# Patient Record
Sex: Male | Born: 1965 | Hispanic: No
Health system: Southern US, Community
[De-identification: ages and names within clinical notes are randomized; demographics above are authoritative.]

## PROBLEM LIST (undated history)

## (undated) DIAGNOSIS — S12690S Other displaced fracture of seventh cervical vertebra, sequela: Secondary | ICD-10-CM

## (undated) DIAGNOSIS — I509 Heart failure, unspecified: Secondary | ICD-10-CM

## (undated) DIAGNOSIS — J9621 Acute and chronic respiratory failure with hypoxia: Secondary | ICD-10-CM

## (undated) DIAGNOSIS — I2699 Other pulmonary embolism without acute cor pulmonale: Secondary | ICD-10-CM

## (undated) DIAGNOSIS — J181 Lobar pneumonia, unspecified organism: Secondary | ICD-10-CM

---

## 2000-06-13 ENCOUNTER — Other Ambulatory Visit: Admission: RE | Admit: 2000-06-13 | Discharge: 2000-06-13 | Payer: Self-pay

## 2000-08-15 ENCOUNTER — Other Ambulatory Visit: Admission: RE | Admit: 2000-08-15 | Discharge: 2000-08-15 | Payer: Self-pay | Admitting: Podiatry

## 2006-01-08 ENCOUNTER — Ambulatory Visit: Payer: Self-pay | Admitting: Internal Medicine

## 2007-05-23 ENCOUNTER — Inpatient Hospital Stay: Payer: Self-pay | Admitting: Internal Medicine

## 2007-08-22 ENCOUNTER — Inpatient Hospital Stay: Payer: Self-pay | Admitting: Internal Medicine

## 2009-02-03 ENCOUNTER — Inpatient Hospital Stay: Payer: Self-pay | Admitting: Internal Medicine

## 2010-01-16 ENCOUNTER — Emergency Department: Payer: Self-pay | Admitting: Emergency Medicine

## 2010-01-17 ENCOUNTER — Inpatient Hospital Stay: Payer: Self-pay | Admitting: Internal Medicine

## 2018-12-23 ENCOUNTER — Other Ambulatory Visit (HOSPITAL_COMMUNITY): Payer: Medicare Other

## 2018-12-23 ENCOUNTER — Inpatient Hospital Stay
Admission: RE | Admit: 2018-12-23 | Discharge: 2019-03-19 | Disposition: A | Discharge status: Skilled Nursing Facility | Payer: Medicare Other | Source: Other Acute Inpatient Hospital | Attending: Internal Medicine | Admitting: Internal Medicine

## 2018-12-23 DIAGNOSIS — I82409 Acute embolism and thrombosis of unspecified deep veins of unspecified lower extremity: Secondary | ICD-10-CM

## 2018-12-23 DIAGNOSIS — D72829 Elevated white blood cell count, unspecified: Secondary | ICD-10-CM

## 2018-12-23 DIAGNOSIS — J189 Pneumonia, unspecified organism: Secondary | ICD-10-CM

## 2018-12-23 DIAGNOSIS — R509 Fever, unspecified: Secondary | ICD-10-CM

## 2018-12-23 DIAGNOSIS — Z9911 Dependence on respirator [ventilator] status: Secondary | ICD-10-CM

## 2018-12-23 DIAGNOSIS — Z930 Tracheostomy status: Secondary | ICD-10-CM

## 2018-12-23 DIAGNOSIS — J9811 Atelectasis: Secondary | ICD-10-CM

## 2018-12-23 DIAGNOSIS — N179 Acute kidney failure, unspecified: Secondary | ICD-10-CM

## 2018-12-23 DIAGNOSIS — J9621 Acute and chronic respiratory failure with hypoxia: Secondary | ICD-10-CM | POA: Diagnosis present

## 2018-12-23 DIAGNOSIS — J9690 Respiratory failure, unspecified, unspecified whether with hypoxia or hypercapnia: Secondary | ICD-10-CM

## 2018-12-23 DIAGNOSIS — J181 Lobar pneumonia, unspecified organism: Secondary | ICD-10-CM | POA: Diagnosis present

## 2018-12-23 DIAGNOSIS — I509 Heart failure, unspecified: Secondary | ICD-10-CM

## 2018-12-23 DIAGNOSIS — S12690S Other displaced fracture of seventh cervical vertebra, sequela: Secondary | ICD-10-CM

## 2018-12-23 DIAGNOSIS — I2699 Other pulmonary embolism without acute cor pulmonale: Secondary | ICD-10-CM | POA: Diagnosis present

## 2018-12-23 DIAGNOSIS — R0902 Hypoxemia: Secondary | ICD-10-CM

## 2018-12-23 DIAGNOSIS — Z931 Gastrostomy status: Secondary | ICD-10-CM

## 2018-12-23 HISTORY — DX: Acute and chronic respiratory failure with hypoxia: J96.21

## 2018-12-23 HISTORY — DX: Lobar pneumonia, unspecified organism: J18.1

## 2018-12-23 HISTORY — DX: Other displaced fracture of seventh cervical vertebra, sequela: S12.690S

## 2018-12-23 HISTORY — DX: Other pulmonary embolism without acute cor pulmonale: I26.99

## 2018-12-23 HISTORY — DX: Heart failure, unspecified: I50.9

## 2018-12-23 MED ORDER — IOHEXOL 300 MG/ML  SOLN
50.0000 mL | Freq: Once | INTRAMUSCULAR | Status: AC | PRN
  Administered 2018-12-23: 23:00:00 50 mL

## 2018-12-24 ENCOUNTER — Encounter: Payer: Self-pay | Admitting: Internal Medicine

## 2018-12-24 DIAGNOSIS — I2699 Other pulmonary embolism without acute cor pulmonale: Secondary | ICD-10-CM

## 2018-12-24 DIAGNOSIS — J9621 Acute and chronic respiratory failure with hypoxia: Secondary | ICD-10-CM | POA: Diagnosis not present

## 2018-12-24 DIAGNOSIS — J181 Lobar pneumonia, unspecified organism: Secondary | ICD-10-CM | POA: Diagnosis not present

## 2018-12-24 DIAGNOSIS — I509 Heart failure, unspecified: Secondary | ICD-10-CM

## 2018-12-24 DIAGNOSIS — S12690S Other displaced fracture of seventh cervical vertebra, sequela: Secondary | ICD-10-CM

## 2018-12-24 LAB — CBC
HCT: 29.5 % — ABNORMAL LOW (ref 39.0–52.0)
Hemoglobin: 8.7 g/dL — ABNORMAL LOW (ref 13.0–17.0)
MCH: 29.1 pg (ref 26.0–34.0)
MCHC: 29.5 g/dL — ABNORMAL LOW (ref 30.0–36.0)
MCV: 98.7 fL (ref 80.0–100.0)
Platelets: 183 10*3/uL (ref 150–400)
RBC: 2.99 MIL/uL — ABNORMAL LOW (ref 4.22–5.81)
RDW: 14 % (ref 11.5–15.5)
WBC: 5.8 10*3/uL (ref 4.0–10.5)
nRBC: 0 % (ref 0.0–0.2)

## 2018-12-24 LAB — BLOOD GAS, ARTERIAL
Acid-Base Excess: 8 mmol/L — ABNORMAL HIGH (ref 0.0–2.0)
Bicarbonate: 32.6 mmol/L — ABNORMAL HIGH (ref 20.0–28.0)
FIO2: 60
O2 Saturation: 99.2 %
Patient temperature: 37
pCO2 arterial: 51.6 mmHg — ABNORMAL HIGH (ref 32.0–48.0)
pH, Arterial: 7.418 (ref 7.350–7.450)
pO2, Arterial: 132 mmHg — ABNORMAL HIGH (ref 83.0–108.0)

## 2018-12-24 LAB — COMPREHENSIVE METABOLIC PANEL
ALT: 49 U/L — ABNORMAL HIGH (ref 0–44)
AST: 27 U/L (ref 15–41)
Albumin: 1.9 g/dL — ABNORMAL LOW (ref 3.5–5.0)
Alkaline Phosphatase: 71 U/L (ref 38–126)
Anion gap: 10 (ref 5–15)
BUN: 24 mg/dL — ABNORMAL HIGH (ref 6–20)
CO2: 29 mmol/L (ref 22–32)
Calcium: 9.5 mg/dL (ref 8.9–10.3)
Chloride: 102 mmol/L (ref 98–111)
Creatinine, Ser: 0.33 mg/dL — ABNORMAL LOW (ref 0.61–1.24)
GFR calc Af Amer: >60 mL/min (ref >60)
GFR calc non Af Amer: >60 mL/min (ref >60)
Glucose, Bld: 103 mg/dL — ABNORMAL HIGH (ref 70–99)
Potassium: 4.4 mmol/L (ref 3.5–5.1)
Sodium: 141 mmol/L (ref 135–145)
Total Bilirubin: 0.6 mg/dL (ref 0.3–1.2)
Total Protein: 5.9 g/dL — ABNORMAL LOW (ref 6.5–8.1)

## 2018-12-24 LAB — TSH: TSH: 2.088 u[IU]/mL (ref 0.350–4.500)

## 2018-12-24 LAB — APTT: aPTT: 34 seconds (ref 24–36)

## 2018-12-24 LAB — PROTIME-INR
INR: 1.1 (ref 0.8–1.2)
Prothrombin Time: 14.3 seconds (ref 11.4–15.2)

## 2018-12-24 MED ORDER — INSULIN REGULAR HUMAN 100 UNIT/ML IJ SOLN
0.00 | INTRAMUSCULAR | Status: DC
Start: 2018-12-23 — End: 2018-12-24

## 2018-12-24 MED ORDER — ONDANSETRON HCL 4 MG/2ML IJ SOLN
4.00 | INTRAMUSCULAR | Status: DC
Start: ? — End: 2018-12-24

## 2018-12-24 MED ORDER — CLOZAPINE 100 MG PO TABS
600.00 | ORAL_TABLET | ORAL | Status: DC
Start: ? — End: 2018-12-24

## 2018-12-24 MED ORDER — BISACODYL 10 MG RE SUPP
10.00 | RECTAL | Status: DC
Start: 2018-12-24 — End: 2018-12-24

## 2018-12-24 MED ORDER — SODIUM CHLORIDE FLUSH 0.9 % IV SOLN
10.00 | INTRAVENOUS | Status: DC
Start: 2018-12-23 — End: 2018-12-24

## 2018-12-24 MED ORDER — MORPHINE SULFATE 4 MG/ML IJ SOLN
4.00 | INTRAMUSCULAR | Status: DC
Start: ? — End: 2018-12-24

## 2018-12-24 MED ORDER — DEXTROSE 50 % IV SOLN
12.50 | INTRAVENOUS | Status: DC
Start: ? — End: 2018-12-24

## 2018-12-24 MED ORDER — SENNA 8.8 MG/5ML PO SYRP
10.00 | ORAL_SOLUTION | ORAL | Status: DC
Start: 2018-12-23 — End: 2018-12-24

## 2018-12-24 MED ORDER — GENERIC EXTERNAL MEDICATION
1.00 | Status: DC
Start: 2018-12-24 — End: 2018-12-24

## 2018-12-24 MED ORDER — OXYCODONE HCL 5 MG PO TABS
5.00 | ORAL_TABLET | ORAL | Status: DC
Start: ? — End: 2018-12-24

## 2018-12-24 MED ORDER — INSULIN NPH (HUMAN) (ISOPHANE) 100 UNIT/ML ~~LOC~~ SUSP
10.00 | SUBCUTANEOUS | Status: DC
Start: ? — End: 2018-12-24

## 2018-12-24 MED ORDER — CLOMIPRAMINE HCL 50 MG PO CAPS
100.00 | ORAL_CAPSULE | ORAL | Status: DC
Start: 2018-12-24 — End: 2018-12-24

## 2018-12-24 MED ORDER — ESOMEPRAZOLE MAGNESIUM 20 MG PO PACK
20.00 | PACK | ORAL | Status: DC
Start: 2018-12-24 — End: 2018-12-24

## 2018-12-24 MED ORDER — GENERIC EXTERNAL MEDICATION
240.00 | Status: DC
Start: ? — End: 2018-12-24

## 2018-12-24 MED ORDER — BACITRACIN 500 UNIT/GM EX OINT
TOPICAL_OINTMENT | CUTANEOUS | Status: DC
Start: 2018-12-23 — End: 2018-12-24

## 2018-12-24 MED ORDER — ATORVASTATIN CALCIUM 40 MG PO TABS
40.00 | ORAL_TABLET | ORAL | Status: DC
Start: 2018-12-23 — End: 2018-12-24

## 2018-12-24 MED ORDER — VALPROIC ACID 250 MG/5ML PO SOLN
500.00 | ORAL | Status: DC
Start: 2018-12-23 — End: 2018-12-24

## 2018-12-24 MED ORDER — ALBUTEROL SULFATE (2.5 MG/3ML) 0.083% IN NEBU
2.50 | INHALATION_SOLUTION | RESPIRATORY_TRACT | Status: DC
Start: ? — End: 2018-12-24

## 2018-12-24 MED ORDER — QUETIAPINE FUMARATE 25 MG PO TABS
25.00 | ORAL_TABLET | ORAL | Status: DC
Start: ? — End: 2018-12-24

## 2018-12-24 MED ORDER — CHLORHEXIDINE GLUCONATE 0.12 % MT SOLN
5.00 | OROMUCOSAL | Status: DC
Start: 2018-12-23 — End: 2018-12-24

## 2018-12-24 MED ORDER — APIXABAN 5 MG PO TABS
5.00 | ORAL_TABLET | ORAL | Status: DC
Start: 2018-12-23 — End: 2018-12-24

## 2018-12-24 MED ORDER — CARBOXYMETHYLCELLULOSE SOD PF 1 % OP GEL
2.00 | OPHTHALMIC | Status: DC
Start: 2018-12-23 — End: 2018-12-24

## 2018-12-24 MED ORDER — BUPROPION HCL 75 MG PO TABS
75.00 | ORAL_TABLET | ORAL | Status: DC
Start: 2018-12-23 — End: 2018-12-24

## 2018-12-24 MED ORDER — POLYETHYLENE GLYCOL 3350 17 G PO PACK
17.00 | PACK | ORAL | Status: DC
Start: 2018-12-24 — End: 2018-12-24

## 2018-12-24 MED ORDER — DOCUSATE SODIUM 150 MG/15ML PO LIQD
100.00 | ORAL | Status: DC
Start: 2018-12-23 — End: 2018-12-24

## 2018-12-24 MED ORDER — METHOCARBAMOL 500 MG PO TABS
1000.00 | ORAL_TABLET | ORAL | Status: DC
Start: 2018-12-23 — End: 2018-12-24

## 2018-12-24 NOTE — Consult Note (Signed)
[facility]  Date of Service: 12/24/2018  PULMONARY CRITICAL CARE CONSULT   PARAG DORTON  FAO:[id number]  DOB: [birth year]   DOA: 12/23/2018  Referring Physician: Merton Border, MD  HPI: Raymond Avila is a 53 y.o. male seen for follow up of Acute on Chronic Respiratory Failure.  Patient with multiple medical problems including morbid obesity schizophrenia apparently took a fall and fractured C7-T1.  The patient apparently underwent an ACDF on October 15 and has postoperative course was complicated by respiratory failure prolonged intubation who eventually ended up having to have a tracheostomy.  Along with this he had other complications related to pneumonia.  Patient presents to [place] now for further management of bleeding basically morbidly obese gentleman with a tracheostomy in place and was currently on a PEEP of 14  Review of Systems:  ROS performed and is unremarkable other than noted above.  Past Surgical History:  Procedure Laterality Date  . PR ALLOGRAFT FOR SPINE SURGERY ONLY MORSELIZED Midline 11/26/2018  Procedure: ALLOGRAFT FOR SPINE SURGERY ONLY; MORSELIZED; Surgeon: Sheran Fava, MD; Location: MAIN OR [facility]; Service: Neurosurgery  . PR ARTHRODESIS ANT INTERBODY INC DISCECTOMY, CERVICAL BELOW C2 Bilateral 11/26/2018  Procedure: ARTHRODES, ANT INTRBDY, INCL DISC SPC PREP, DISCECT, OSTEOPHYT/DECOMPRESS SPINL CRD &/OR NRV RT, CRV BLO C2; Surgeon: Sheran Fava, MD; Location: MAIN OR [facility]; Service: Neurosurgery  . PR INSJ BIOMCHN DEV INTERVERTEBRAL DSC SPC W/ARTHRD Midline 11/26/2018  Procedure: INSERT INTERBODY BIOMECHANICAL DEVICE(S) WITH INTEGRAL ANTERIOR INSTRUMENT FOR DEVICE ANCHORING, WHEN PERFORMED, TO INTERVERTEBRAL DISC SPACE IN CONJUNCTION WITH INTERBODY ARTHRODESIS, EACH INTERSPACE; Surgeon: Sheran Fava, MD; Location: MAIN OR [place]; Service: Neurosurgery  . PR IONM 1 ON 1 IN OR  W/ATTENDANCE EACH 15 MINUTES N/A 11/26/2018  Procedure: CONTINUOUS INTRAOPERATIVE NEUROPHYSIOLOGY MONITORING IN OR; Surgeon: Sheran Fava, MD; Location: MAIN OR [facility]; Service: Neurosurgery  . PR OPEN POST RX CERV VERT FX,1 LVL Midline 11/26/2018  Procedure: Open Treatment &/Or Reduction Vertebral Fx &/Or Dislocat Posterior Appr, 1 Fx Vertebra/Disl Segmt; Cervical; Surgeon: Sheran Fava, MD; Location: MAIN OR [facility]; Service: Neurosurgery  . PR OSTEOTOMY CERV SP,ANT,REMV DISK Midline 11/26/2018  Procedure: Osteotomy Of Spine, Including Diskectomy, Anterior Approach, Single Vertebral Segment; Cervical; Surgeon: Sheran Fava, MD; Location: MAIN OR [facility]; Service: Neurosurgery  . PR REMOVAL NODES, NECK,CERV MOD RAD N/A 11/26/2018  Procedure: CERVICAL LYMPHADENECTOMY (MODIFIED RADICAL NECK DISSECTION); Surgeon: Weber Cooks, MD; Location: MAIN OR [facility]; Service: ENT    Allergies No known allergies  Past medical history Schizophrenia Chronic pain ACDF Morbid obesity Pulmonary edema Pneumonia  Tobacco Use  . Smoking status: Never Smoker  . Smokeless tobacco: Never Used  Substance Use Topics  . Alcohol use: Not on file   Family history: Noncontributory to the present illness  Medications: Reviewed on Rounds  Physical Exam:  Vitals: Temperature 97.5 pulse 71 respiratory 15 blood pressure 136/91 saturations 98%  Ventilator Settings mode of ventilation assist control FiO2 55% tidal volume 532 PEEP 14  . General: Comfortable at this time . Eyes: Grossly normal lids, irises & conjunctiva . ENT: grossly tongue is normal . Neck: no obvious mass . Cardiovascular: S1-S2 normal no gallop or rub is noted . Respiratory: No rhonchi no rales are noted . Abdomen: Morbidly obese . Skin: no rash seen on limited exam . Musculoskeletal: not rigid . Psychiatric:unable to assess . Neurologic: no seizure no involuntary movements         Labs on  Admission:  Basic  Metabolic Panel: No results for input(s): NA, K, CL, CO2, GLUCOSE, BUN, CREATININE, CALCIUM, MG, PHOS in the last 168 hours.  Recent Labs  Lab 12/24/18 0015  PHART 7.418  PCO2ART 51.6*  PO2ART 132*  HCO3 32.6*  O2SAT 99.2    Liver Function Tests: No results for input(s): AST, ALT, ALKPHOS, BILITOT, PROT, ALBUMIN in the last 168 hours. No results for input(s): LIPASE, AMYLASE in the last 168 hours. No results for input(s): AMMONIA in the last 168 hours.  CBC: No results for input(s): WBC, NEUTROABS, HGB, HCT, MCV, PLT in the last 168 hours.  Cardiac Enzymes: No results for input(s): CKTOTAL, CKMB, CKMBINDEX, TROPONINI in the last 168 hours.  BNP (last 3 results) No results for input(s): BNP in the last 8760 hours.  ProBNP (last 3 results) No results for input(s): PROBNP in the last 8760 hours.   Radiological Exams on Admission: Dg Abdomen Peg Tube Location  Result Date: 12/23/2018 CLINICAL DATA:  Status post PEG tube placement. EXAM: ABDOMEN - 1 VIEW COMPARISON:  None. FINDINGS: Injection of contrast through the gastrostomy tube opacifies the stomach. The bowel gas pattern is nonobstructive and nonspecific. IMPRESSION: Injection of contrast through the PEG tube opacifies the stomach. Electronically Signed   By: Katherine Mantle M.D.   On: 12/23/2018 23:37   Dg Chest Port 1 View  Result Date: 12/23/2018 CLINICAL DATA:  Respiratory failure EXAM: PORTABLE CHEST 1 VIEW COMPARISON:  August 22, 2007 FINDINGS: The right-sided PICC line is well position. There is scattered bronchitic changes at the lung bases bilaterally. Atelectasis is noted. No pneumothorax. No large pleural effusion. The heart size is enlarged. There appears to be a tracheostomy tube in place, however an overlying ACDF plate obscures the upper thorax. IMPRESSION: 1. Lines and tubes as above. 2. Cardiomegaly. 3. Bibasilar airspace disease favored to represent atelectasis with infiltrate not excluded. Electronically  Signed   By: Katherine Mantle M.D.   On: 12/23/2018 23:39    Assessment/Plan Active Problems:   Acute on chronic respiratory failure with hypoxia (HCC)   Lobar pneumonia, unspecified organism (HCC)   Chronic congestive heart failure (HCC)   Closed C7 fracture without spinal cord injury, sequela   Pulmonary embolism and infarction (HCC)   1. Acute on chronic respiratory failure hypoxia at this time patient is on full vent support of asked for respiratory therapy to assess the RSB I mechanics and try to begin the weaning protocol. 2. Chronic congestive heart failure chest x-ray shows significant cardiomegaly we will need to monitor his fluid status quite closely. 3. Lobar pneumonia at the other facility was treated chest x-ray still showing some atelectasis versus infiltrate we will continue to monitor. 4. C7 fracture status post ACDF hopefully because of the lower site of the lesion we should be able to try to wean the patient off the ventilator.  His size will be the main limiting factor. 5. Possible pulmonary embolism patient has been treated at the other facility we will need to follow-up this is noted on review of the chart  I have personally seen and evaluated the patient, evaluated laboratory and imaging results, formulated the assessment and plan and placed orders. The Patient requires high complexity decision making for assessment and support.  Case was discussed on Rounds with the Respiratory Therapy Staff Time Spent  Yevonne Pax, MD [facility] Pulmonary Critical Care Medicine Sleep Medicine

## 2018-12-25 ENCOUNTER — Other Ambulatory Visit (HOSPITAL_COMMUNITY): Payer: Medicare Other

## 2018-12-25 DIAGNOSIS — I509 Heart failure, unspecified: Secondary | ICD-10-CM | POA: Diagnosis not present

## 2018-12-25 DIAGNOSIS — S12690S Other displaced fracture of seventh cervical vertebra, sequela: Secondary | ICD-10-CM | POA: Diagnosis not present

## 2018-12-25 DIAGNOSIS — J181 Lobar pneumonia, unspecified organism: Secondary | ICD-10-CM | POA: Diagnosis not present

## 2018-12-25 DIAGNOSIS — J9621 Acute and chronic respiratory failure with hypoxia: Secondary | ICD-10-CM | POA: Diagnosis not present

## 2018-12-25 LAB — BASIC METABOLIC PANEL
Anion gap: 10 (ref 5–15)
BUN: 28 mg/dL — ABNORMAL HIGH (ref 6–20)
CO2: 31 mmol/L (ref 22–32)
Calcium: 9.4 mg/dL (ref 8.9–10.3)
Chloride: 100 mmol/L (ref 98–111)
Creatinine, Ser: 0.55 mg/dL — ABNORMAL LOW (ref 0.61–1.24)
GFR calc Af Amer: >60 mL/min (ref >60)
GFR calc non Af Amer: >60 mL/min (ref >60)
Glucose, Bld: 127 mg/dL — ABNORMAL HIGH (ref 70–99)
Potassium: 4.4 mmol/L (ref 3.5–5.1)
Sodium: 141 mmol/L (ref 135–145)

## 2018-12-25 LAB — URINALYSIS, ROUTINE W REFLEX MICROSCOPIC
Glucose, UA: NEGATIVE mg/dL
Hgb urine dipstick: NEGATIVE
Ketones, ur: NEGATIVE mg/dL
Leukocytes,Ua: NEGATIVE
Nitrite: NEGATIVE
Protein, ur: 30 mg/dL — AB
Specific Gravity, Urine: 1.03 (ref 1.005–1.030)
pH: 5 (ref 5.0–8.0)

## 2018-12-25 LAB — MAGNESIUM: Magnesium: 2.3 mg/dL (ref 1.7–2.4)

## 2018-12-25 LAB — HEMOGLOBIN A1C
Hgb A1c MFr Bld: 6.6 % — ABNORMAL HIGH (ref 4.8–5.6)
Mean Plasma Glucose: 142.72 mg/dL

## 2018-12-25 LAB — CBC
HCT: 28 % — ABNORMAL LOW (ref 39.0–52.0)
Hemoglobin: 8.4 g/dL — ABNORMAL LOW (ref 13.0–17.0)
MCH: 29.5 pg (ref 26.0–34.0)
MCHC: 30 g/dL (ref 30.0–36.0)
MCV: 98.2 fL (ref 80.0–100.0)
Platelets: 193 10*3/uL (ref 150–400)
RBC: 2.85 MIL/uL — ABNORMAL LOW (ref 4.22–5.81)
RDW: 14.5 % (ref 11.5–15.5)
WBC: 7.6 10*3/uL (ref 4.0–10.5)
nRBC: 0 % (ref 0.0–0.2)

## 2018-12-25 LAB — PHOSPHORUS: Phosphorus: 3.9 mg/dL (ref 2.5–4.6)

## 2018-12-25 NOTE — Progress Notes (Signed)
Pulmonary Critical Care Medicine [place]   PULMONARY CRITICAL CARE SERVICE  PROGRESS NOTE  Date of Service: 12/25/2018  Raymond Avila  KGU:[id number]  DOB: 04/25/1965   DOA: 12/23/2018  Referring Physician: Merton Border, MD  HPI: Raymond Avila is a 53 y.o. male seen for follow up of Acute on Chronic Respiratory Failure.  Patient is on the ventilator and full support right now has been on assist control mode currently oxygen requirements are up to 60%  Medications: Reviewed on Rounds  Physical Exam:  Vitals: Temperature 97.9 pulse 74 respiratory rate 15 blood pressure 128/56 saturations 100%  Ventilator Settings mode ventilation assist control FiO2 60% tidal volume 550 PEEP 12  . General: Comfortable at this time . Eyes: Grossly normal lids, irises & conjunctiva . ENT: grossly tongue is normal . Neck: no obvious mass . Cardiovascular: S1 S2 normal no gallop . Respiratory: No rhonchi no rales are noted at this time . Abdomen: soft . Skin: no rash seen on limited exam . Musculoskeletal: not rigid . Psychiatric:unable to assess . Neurologic: no seizure no involuntary movements         Lab Data:   Basic Metabolic Panel: Recent Labs  Lab 12/24/18 0618 12/25/18 0931  NA 141 141  K 4.4 4.4  CL 102 100  CO2 29 31  GLUCOSE 103* 127*  BUN 24* 28*  CREATININE 0.33* 0.55*  CALCIUM 9.5 9.4  MG  --  2.3  PHOS  --  3.9    ABG: Recent Labs  Lab 12/24/18 0015  PHART 7.418  PCO2ART 51.6*  PO2ART 132*  HCO3 32.6*  O2SAT 99.2    Liver Function Tests: Recent Labs  Lab 12/24/18 0618  AST 27  ALT 49*  ALKPHOS 71  BILITOT 0.6  PROT 5.9*  ALBUMIN 1.9*   No results for input(s): LIPASE, AMYLASE in the last 168 hours. No results for input(s): AMMONIA in the last 168 hours.  CBC: Recent Labs  Lab 12/24/18 0618 12/25/18 0931  WBC 5.8 7.6  HGB 8.7* 8.4*  HCT 29.5* 28.0*  MCV 98.7 98.2  PLT 183 193    Cardiac Enzymes: No results  for input(s): CKTOTAL, CKMB, CKMBINDEX, TROPONINI in the last 168 hours.  BNP (last 3 results) No results for input(s): BNP in the last 8760 hours.  ProBNP (last 3 results) No results for input(s): PROBNP in the last 8760 hours.  Radiological Exams: Dg Abdomen Peg Tube Location  Result Date: 12/23/2018 CLINICAL DATA:  Status post PEG tube placement. EXAM: ABDOMEN - 1 VIEW COMPARISON:  None. FINDINGS: Injection of contrast through the gastrostomy tube opacifies the stomach. The bowel gas pattern is nonobstructive and nonspecific. IMPRESSION: Injection of contrast through the PEG tube opacifies the stomach. Electronically Signed   By: Constance Holster M.D.   On: 12/23/2018 23:37   Dg Chest Port 1 View  Result Date: 12/25/2018 CLINICAL DATA:  Post tracheostomy.  Chronic respiratory failure. EXAM: PORTABLE CHEST 1 VIEW COMPARISON:  12/23/2018 FINDINGS: Patient slightly rotated to the right. Tracheostomy tube in adequate position. Right-sided PICC line unchanged. Lungs are hypoinflated with minimal prominence of the perihilar markings unchanged and may be due to the degree of hypoinflation versus mild vascular congestion. No effusion. Cardiomediastinal silhouette and remainder of the exam is unchanged. IMPRESSION: Hypoinflation with mild prominence of the pulmonary vasculature unchanged and may be due to the degree of hypoinflation versus mild vascular congestion. Tubes and lines as described. Electronically Signed   By: Quillian Quince  Micheline Maze M.D.   On: 12/25/2018 07:12   Dg Chest Port 1 View  Result Date: 12/23/2018 CLINICAL DATA:  Respiratory failure EXAM: PORTABLE CHEST 1 VIEW COMPARISON:  August 22, 2007 FINDINGS: The right-sided PICC line is well position. There is scattered bronchitic changes at the lung bases bilaterally. Atelectasis is noted. No pneumothorax. No large pleural effusion. The heart size is enlarged. There appears to be a tracheostomy tube in place, however an overlying ACDF plate  obscures the upper thorax. IMPRESSION: 1. Lines and tubes as above. 2. Cardiomegaly. 3. Bibasilar airspace disease favored to represent atelectasis with infiltrate not excluded. Electronically Signed   By: Katherine Mantle M.D.   On: 12/23/2018 23:39    Assessment/Plan Active Problems:   Acute on chronic respiratory failure with hypoxia (HCC)   Lobar pneumonia, unspecified organism (HCC)   Chronic congestive heart failure (HCC)   Closed C7 fracture without spinal cord injury, sequela   Pulmonary embolism and infarction (HCC)   1. Acute on chronic respiratory failure with hypoxia we will try to wean FiO2 down further as well as PEEP patient's RSB I should be checked and try begin on pressure support weaning again 2. You were treated we will continue with supportive care 3. Chronic congestive heart failure at baseline 4. C7 fracture we will continue with supportive care status post ACDF 5. Pulmonary embolism treated   I have personally seen and evaluated the patient, evaluated laboratory and imaging results, formulated the assessment and plan and placed orders. The Patient requires high complexity decision making for assessment and support.  Case was discussed on Rounds with the Respiratory Therapy Staff  Yevonne Pax, MD [facility] Pulmonary Critical Care Medicine Sleep Medicine

## 2018-12-26 ENCOUNTER — Other Ambulatory Visit (HOSPITAL_COMMUNITY): Payer: Medicare Other

## 2018-12-26 DIAGNOSIS — I509 Heart failure, unspecified: Secondary | ICD-10-CM | POA: Diagnosis not present

## 2018-12-26 DIAGNOSIS — J9621 Acute and chronic respiratory failure with hypoxia: Secondary | ICD-10-CM | POA: Diagnosis not present

## 2018-12-26 DIAGNOSIS — S12690S Other displaced fracture of seventh cervical vertebra, sequela: Secondary | ICD-10-CM | POA: Diagnosis not present

## 2018-12-26 DIAGNOSIS — J181 Lobar pneumonia, unspecified organism: Secondary | ICD-10-CM | POA: Diagnosis not present

## 2018-12-26 LAB — URINE CULTURE: Culture: NO GROWTH

## 2018-12-26 LAB — BASIC METABOLIC PANEL
Anion gap: 10 (ref 5–15)
BUN: 30 mg/dL — ABNORMAL HIGH (ref 6–20)
CO2: 33 mmol/L — ABNORMAL HIGH (ref 22–32)
Calcium: 9.6 mg/dL (ref 8.9–10.3)
Chloride: 98 mmol/L (ref 98–111)
Creatinine, Ser: 0.48 mg/dL — ABNORMAL LOW (ref 0.61–1.24)
GFR calc Af Amer: >60 mL/min (ref >60)
GFR calc non Af Amer: >60 mL/min (ref >60)
Glucose, Bld: 101 mg/dL — ABNORMAL HIGH (ref 70–99)
Potassium: 4.6 mmol/L (ref 3.5–5.1)
Sodium: 141 mmol/L (ref 135–145)

## 2018-12-26 LAB — PHOSPHORUS: Phosphorus: 2.9 mg/dL (ref 2.5–4.6)

## 2018-12-26 LAB — CBC
HCT: 31.2 % — ABNORMAL LOW (ref 39.0–52.0)
Hemoglobin: 9.1 g/dL — ABNORMAL LOW (ref 13.0–17.0)
MCH: 28.7 pg (ref 26.0–34.0)
MCHC: 29.2 g/dL — ABNORMAL LOW (ref 30.0–36.0)
MCV: 98.4 fL (ref 80.0–100.0)
Platelets: 200 10*3/uL (ref 150–400)
RBC: 3.17 MIL/uL — ABNORMAL LOW (ref 4.22–5.81)
RDW: 14.5 % (ref 11.5–15.5)
WBC: 7.3 10*3/uL (ref 4.0–10.5)
nRBC: 0 % (ref 0.0–0.2)

## 2018-12-26 LAB — MAGNESIUM: Magnesium: 2.3 mg/dL (ref 1.7–2.4)

## 2018-12-26 NOTE — Progress Notes (Addendum)
Pulmonary Critical Care Medicine [facility] [place]   PULMONARY CRITICAL CARE SERVICE  PROGRESS NOTE  Date of Service: 12/26/2018  TIRRELL BUCHBERGER  TGG:[id number]  DOB: 03/26/1965   DOA: 12/23/2018  Referring Physician: [name], MD  HPI: Raymond Avila is a 53 y.o. male seen for follow up of Acute on Chronic Respiratory Failure.  Patient remains on full support on ventilator at this time unable to wean due to patient's PEEP and also has excessive amount of secretions with a temp of 100.7 today.  Medications: Reviewed on Rounds  Physical Exam:  Vitals: Pulse 78 respirations 31 BP 121/60 O2 sat 97% temp 100.7  Ventilator Settings ventilator mode AC VC rate of 12 tidal line 500 PEEP of 12 with an FiO2 of 50%  . General: Comfortable at this time . Eyes: Grossly normal lids, irises & conjunctiva . ENT: grossly tongue is normal . Neck: no obvious mass . Cardiovascular: S1 S2 normal no gallop . Respiratory: No rales or rhonchi noted . Abdomen: soft . Skin: no rash seen on limited exam . Musculoskeletal: not rigid . Psychiatric:unable to assess . Neurologic: no seizure no involuntary movements         Lab Data:   Basic Metabolic Panel: Recent Labs  Lab 12/24/18 0618 12/25/18 0931 12/26/18 0618  NA 141 141 141  K 4.4 4.4 4.6  CL 102 100 98  CO2 29 31 33*  GLUCOSE 103* 127* 101*  BUN 24* 28* 30*  CREATININE 0.33* 0.55* 0.48*  CALCIUM 9.5 9.4 9.6  MG  --  2.3 2.3  PHOS  --  3.9 2.9    ABG: Recent Labs  Lab 12/24/18 0015  PHART 7.418  PCO2ART 51.6*  PO2ART 132*  HCO3 32.6*  O2SAT 99.2    Liver Function Tests: Recent Labs  Lab 12/24/18 0618  AST 27  ALT 49*  ALKPHOS 71  BILITOT 0.6  PROT 5.9*  ALBUMIN 1.9*   No results for input(s): LIPASE, AMYLASE in the last 168 hours. No results for input(s): AMMONIA in the last 168 hours.  CBC: Recent Labs  Lab 12/24/18 0618 12/25/18 0931 12/26/18 0618  WBC 5.8 7.6 7.3  HGB 8.7* 8.4* 9.1*   HCT 29.5* 28.0* 31.2*  MCV 98.7 98.2 98.4  PLT 183 193 200    Cardiac Enzymes: No results for input(s): CKTOTAL, CKMB, CKMBINDEX, TROPONINI in the last 168 hours.  BNP (last 3 results) No results for input(s): BNP in the last 8760 hours.  ProBNP (last 3 results) No results for input(s): PROBNP in the last 8760 hours.  Radiological Exams: Dg Chest Port 1 View  Result Date: 12/26/2018 CLINICAL DATA:  Respiratory failure EXAM: PORTABLE CHEST 1 VIEW COMPARISON:  12/25/2018 FINDINGS: There is a small, layering right pleural effusion and associated atelectasis or consolidation, more clearly appreciated on this examination. There is chronic pleural thickening about the left lung base. Tracheostomy. Cardiomegaly. IMPRESSION: 1. There is a small, layering right pleural effusion and associated atelectasis or consolidation, more clearly appreciated on this examination, likely increased. PA and lateral radiographs or CT may be helpful to further assess. 2.  Tracheostomy. 3.  Cardiomegaly. Electronically Signed   By: Lauralyn Primes M.D.   On: 12/26/2018 09:38   Dg Chest Port 1 View  Result Date: 12/25/2018 CLINICAL DATA:  Post tracheostomy.  Chronic respiratory failure. EXAM: PORTABLE CHEST 1 VIEW COMPARISON:  12/23/2018 FINDINGS: Patient slightly rotated to the right. Tracheostomy tube in adequate position. Right-sided PICC line unchanged. Lungs  are hypoinflated with minimal prominence of the perihilar markings unchanged and may be due to the degree of hypoinflation versus mild vascular congestion. No effusion. Cardiomediastinal silhouette and remainder of the exam is unchanged. IMPRESSION: Hypoinflation with mild prominence of the pulmonary vasculature unchanged and may be due to the degree of hypoinflation versus mild vascular congestion. Tubes and lines as described. Electronically Signed   By: Marin Olp M.D.   On: 12/25/2018 07:12    Assessment/Plan Active Problems:   Acute on chronic  respiratory failure with hypoxia (HCC)   Lobar pneumonia, unspecified organism (HCC)   Chronic congestive heart failure (HCC)   Closed C7 fracture without spinal cord injury, sequela   Pulmonary embolism and infarction ([place])   1. Acute on chronic respiratory failure with hypoxia patient continues to be weaned as tolerated.  5% on PEEP and FiO2.  Continue supportive measures and pulmonary toilet. 2. Chronic congestive heart failure continue present management. 3. Lobar pneumonia at previous facility treated continue to monitor. 4. C7 fracture we will continue with supportive care status post ACDF 5. Pulmonary embolism treated, continue supportive management    I have personally seen and evaluated the patient, evaluated laboratory and imaging results, formulated the assessment and plan and placed orders. The Patient requires high complexity decision making for assessment and support.  Case was discussed on Rounds with the Respiratory Therapy Staff  Allyne Gee, MD [facility] Pulmonary Critical Care Medicine Sleep Medicine

## 2018-12-27 DIAGNOSIS — I509 Heart failure, unspecified: Secondary | ICD-10-CM | POA: Diagnosis not present

## 2018-12-27 DIAGNOSIS — J9621 Acute and chronic respiratory failure with hypoxia: Secondary | ICD-10-CM | POA: Diagnosis not present

## 2018-12-27 DIAGNOSIS — J181 Lobar pneumonia, unspecified organism: Secondary | ICD-10-CM | POA: Diagnosis not present

## 2018-12-27 DIAGNOSIS — S12690S Other displaced fracture of seventh cervical vertebra, sequela: Secondary | ICD-10-CM | POA: Diagnosis not present

## 2018-12-27 LAB — CULTURE, RESPIRATORY W GRAM STAIN: Culture: NORMAL

## 2018-12-27 LAB — VANCOMYCIN, TROUGH: Vancomycin Tr: 7 ug/mL — ABNORMAL LOW (ref 15–20)

## 2018-12-27 NOTE — Progress Notes (Addendum)
Pulmonary Critical Care Medicine [place]   PULMONARY CRITICAL CARE SERVICE  PROGRESS NOTE  Date of Service: 12/27/2018  UNDRAY ALLMAN  GQQ:[id number]  DOB: [birth year]   DOA: 12/23/2018  Referring Physician: Merton Border, MD  HPI: Raymond Avila is a 53 y.o. male seen for follow up of Acute on Chronic Respiratory Failure.  Patient remains on full support on the ventilator at this time satting well does have fever at this time.  Medications: Reviewed on Rounds  Physical Exam:  Vitals: Pulse 81 R 19 BP 134/67 O2 sat 100% temp 101.4  Ventilator Settings ventilator AC VC rate of 12 tidal volume 500 PEEP of 12 with an FiO2 of 50%  . General: Comfortable at this time . Eyes: Grossly normal lids, irises & conjunctiva . ENT: grossly tongue is normal . Neck: no obvious mass . Cardiovascular: S1 S2 normal no gallop . Respiratory: No rales or rhonchi noted . Abdomen: soft . Skin: no rash seen on limited exam . Musculoskeletal: not rigid . Psychiatric:unable to assess . Neurologic: no seizure no involuntary movements         Lab Data:   Basic Metabolic Panel: Recent Labs  Lab 12/24/18 0618 12/25/18 0931 12/26/18 0618  NA 141 141 141  K 4.4 4.4 4.6  CL 102 100 98  CO2 29 31 33*  GLUCOSE 103* 127* 101*  BUN 24* 28* 30*  CREATININE 0.33* 0.55* 0.48*  CALCIUM 9.5 9.4 9.6  MG  --  2.3 2.3  PHOS  --  3.9 2.9    ABG: Recent Labs  Lab 12/24/18 0015  PHART 7.418  PCO2ART 51.6*  PO2ART 132*  HCO3 32.6*  O2SAT 99.2    Liver Function Tests: Recent Labs  Lab 12/24/18 0618  AST 27  ALT 49*  ALKPHOS 71  BILITOT 0.6  PROT 5.9*  ALBUMIN 1.9*   No results for input(s): LIPASE, AMYLASE in the last 168 hours. No results for input(s): AMMONIA in the last 168 hours.  CBC: Recent Labs  Lab 12/24/18 0618 12/25/18 0931 12/26/18 0618  WBC 5.8 7.6 7.3  HGB 8.7* 8.4* 9.1*  HCT 29.5* 28.0* 31.2*  MCV 98.7 98.2 98.4  PLT 183 193 200     Cardiac Enzymes: No results for input(s): CKTOTAL, CKMB, CKMBINDEX, TROPONINI in the last 168 hours.  BNP (last 3 results) No results for input(s): BNP in the last 8760 hours.  ProBNP (last 3 results) No results for input(s): PROBNP in the last 8760 hours.  Radiological Exams: Dg Chest Port 1 View  Result Date: 12/26/2018 CLINICAL DATA:  Respiratory failure EXAM: PORTABLE CHEST 1 VIEW COMPARISON:  12/25/2018 FINDINGS: There is a small, layering right pleural effusion and associated atelectasis or consolidation, more clearly appreciated on this examination. There is chronic pleural thickening about the left lung base. Tracheostomy. Cardiomegaly. IMPRESSION: 1. There is a small, layering right pleural effusion and associated atelectasis or consolidation, more clearly appreciated on this examination, likely increased. PA and lateral radiographs or CT may be helpful to further assess. 2.  Tracheostomy. 3.  Cardiomegaly. Electronically Signed   By: Eddie Candle M.D.   On: 12/26/2018 09:38    Assessment/Plan Active Problems:   Acute on chronic respiratory failure with hypoxia (HCC)   Lobar pneumonia, unspecified organism (HCC)   Chronic congestive heart failure (HCC)   Closed C7 fracture without spinal cord injury, sequela   Pulmonary embolism and infarction ([place])   1. Acute on chronic respiratory failure with hypoxia  patient is unable to wean at this time due to increased PEEP as well as fever today.  We will continue supportive measures and pulmonary toilet at this time.  Will commence weaning when patient can tolerate. 2. Chronic congestive heart failure continue present management. 3. Lobar pneumonia at previous facility treated continue to monitor. 4. C7 fracture we will continue with supportive care status post ACDF 5. Pulmonary embolism treated, continue supportive management   I have personally seen and evaluated the patient, evaluated laboratory and imaging results,  formulated the assessment and plan and placed orders. The Patient requires high complexity decision making for assessment and support.  Case was discussed on Rounds with the Respiratory Therapy Staff  Yevonne Pax, MD [facility] Pulmonary Critical Care Medicine Sleep Medicine

## 2018-12-28 ENCOUNTER — Other Ambulatory Visit (HOSPITAL_COMMUNITY): Payer: Medicare Other

## 2018-12-28 DIAGNOSIS — I509 Heart failure, unspecified: Secondary | ICD-10-CM | POA: Diagnosis not present

## 2018-12-28 DIAGNOSIS — J181 Lobar pneumonia, unspecified organism: Secondary | ICD-10-CM | POA: Diagnosis not present

## 2018-12-28 DIAGNOSIS — S12690S Other displaced fracture of seventh cervical vertebra, sequela: Secondary | ICD-10-CM | POA: Diagnosis not present

## 2018-12-28 DIAGNOSIS — J9621 Acute and chronic respiratory failure with hypoxia: Secondary | ICD-10-CM | POA: Diagnosis not present

## 2018-12-28 LAB — CBC
HCT: 25.2 % — ABNORMAL LOW (ref 39.0–52.0)
Hemoglobin: 7.6 g/dL — ABNORMAL LOW (ref 13.0–17.0)
MCH: 29.1 pg (ref 26.0–34.0)
MCHC: 30.2 g/dL (ref 30.0–36.0)
MCV: 96.6 fL (ref 80.0–100.0)
Platelets: 191 10*3/uL (ref 150–400)
RBC: 2.61 MIL/uL — ABNORMAL LOW (ref 4.22–5.81)
RDW: 14.1 % (ref 11.5–15.5)
WBC: 6.9 10*3/uL (ref 4.0–10.5)
nRBC: 0 % (ref 0.0–0.2)

## 2018-12-28 LAB — BASIC METABOLIC PANEL
Anion gap: 10 (ref 5–15)
BUN: 23 mg/dL — ABNORMAL HIGH (ref 6–20)
CO2: 34 mmol/L — ABNORMAL HIGH (ref 22–32)
Calcium: 9 mg/dL (ref 8.9–10.3)
Chloride: 94 mmol/L — ABNORMAL LOW (ref 98–111)
Creatinine, Ser: 0.46 mg/dL — ABNORMAL LOW (ref 0.61–1.24)
GFR calc Af Amer: >60 mL/min (ref >60)
GFR calc non Af Amer: >60 mL/min (ref >60)
Glucose, Bld: 107 mg/dL — ABNORMAL HIGH (ref 70–99)
Potassium: 4.5 mmol/L (ref 3.5–5.1)
Sodium: 138 mmol/L (ref 135–145)

## 2018-12-28 LAB — PHOSPHORUS: Phosphorus: 3.1 mg/dL (ref 2.5–4.6)

## 2018-12-28 LAB — OCCULT BLOOD X 1 CARD TO LAB, STOOL: Fecal Occult Bld: NEGATIVE

## 2018-12-28 LAB — MAGNESIUM: Magnesium: 2.1 mg/dL (ref 1.7–2.4)

## 2018-12-28 NOTE — Progress Notes (Signed)
Pulmonary Critical Care Medicine [place]   PULMONARY CRITICAL CARE SERVICE  PROGRESS NOTE  Date of Service: 12/28/2018  Raymond Avila  LNL:[id number]  DOB: 1965-07-02   DOA: 12/23/2018  Referring Physician: Merton Border, MD  HPI: Raymond Avila is a 53 y.o. male seen for follow up of Acute on Chronic Respiratory Failure.  Patient currently is on full support is still on a PEEP of 12 spoke with respiratory therapy to wean the PEEP down so that we can advance weaning  Medications: Reviewed on Rounds  Physical Exam:  Vitals: Temperature 100.1 pulse 87 respiratory 26 blood pressure 135/79 saturations 99%  Ventilator Settings mode ventilation assist control FiO2 45% tidal line 523 PEEP 12  . General: Comfortable at this time . Eyes: Grossly normal lids, irises & conjunctiva . ENT: grossly tongue is normal . Neck: no obvious mass . Cardiovascular: S1 S2 normal no gallop . Respiratory: No rhonchi no rales are noted at this time . Abdomen: soft . Skin: no rash seen on limited exam . Musculoskeletal: not rigid . Psychiatric:unable to assess . Neurologic: no seizure no involuntary movements         Lab Data:   Basic Metabolic Panel: Recent Labs  Lab 12/24/18 0618 12/25/18 0931 12/26/18 0618 12/28/18 0500  NA 141 141 141 138  K 4.4 4.4 4.6 4.5  CL 102 100 98 94*  CO2 29 31 33* 34*  GLUCOSE 103* 127* 101* 107*  BUN 24* 28* 30* 23*  CREATININE 0.33* 0.55* 0.48* 0.46*  CALCIUM 9.5 9.4 9.6 9.0  MG  --  2.3 2.3 2.1  PHOS  --  3.9 2.9 3.1    ABG: Recent Labs  Lab 12/24/18 0015  PHART 7.418  PCO2ART 51.6*  PO2ART 132*  HCO3 32.6*  O2SAT 99.2    Liver Function Tests: Recent Labs  Lab 12/24/18 0618  AST 27  ALT 49*  ALKPHOS 71  BILITOT 0.6  PROT 5.9*  ALBUMIN 1.9*   No results for input(s): LIPASE, AMYLASE in the last 168 hours. No results for input(s): AMMONIA in the last 168 hours.  CBC: Recent Labs  Lab 12/24/18 0618  12/25/18 0931 12/26/18 0618 12/28/18 0500  WBC 5.8 7.6 7.3 6.9  HGB 8.7* 8.4* 9.1* 7.6*  HCT 29.5* 28.0* 31.2* 25.2*  MCV 98.7 98.2 98.4 96.6  PLT 183 193 200 191    Cardiac Enzymes: No results for input(s): CKTOTAL, CKMB, CKMBINDEX, TROPONINI in the last 168 hours.  BNP (last 3 results) No results for input(s): BNP in the last 8760 hours.  ProBNP (last 3 results) No results for input(s): PROBNP in the last 8760 hours.  Radiological Exams: Dg Chest Port 1 View  Result Date: 12/28/2018 CLINICAL DATA:  Respiratory failure EXAM: PORTABLE CHEST 1 VIEW COMPARISON:  December 26, 2018 FINDINGS: Tracheostomy device is present. Right PICC line is unchanged. Persistent right pleural effusion and right basilar opacity. Chronic left pleural thickening. No pneumothorax. Stable cardiomediastinal silhouette. IMPRESSION: Similar appearance of right pleural effusion and adjacent basilar atelectasis/consolidation. Electronically Signed   By: Macy Mis M.D.   On: 12/28/2018 08:20    Assessment/Plan Active Problems:   Acute on chronic respiratory failure with hypoxia (HCC)   Lobar pneumonia, unspecified organism (HCC)   Chronic congestive heart failure (HCC)   Closed C7 fracture without spinal cord injury, sequela   Pulmonary embolism and infarction ([place])   1. Acute on chronic respiratory failure hypoxia continue to decrease the PEEP we should be  able to go down to 10 today and then try to advance weaning 2. Lobar pneumonia treated chest x-ray not showing much change in terms of the atelectasis continue aggressive pulmonary toilet 3. Chronic congestive heart failure diurese as tolerated continue with supportive care 4. C7 fracture no changes noted 5. Pulmonary embolism treated we will continue supportive care   I have personally seen and evaluated the patient, evaluated laboratory and imaging results, formulated the assessment and plan and placed orders. The Patient requires high  complexity decision making for assessment and support.  Case was discussed on Rounds with the Respiratory Therapy Staff  Yevonne Pax, MD [facility] Pulmonary Critical Care Medicine Sleep Medicine

## 2018-12-29 ENCOUNTER — Institutional Professional Consult (permissible substitution) (HOSPITAL_COMMUNITY): Payer: Medicare Other

## 2018-12-29 DIAGNOSIS — S12690S Other displaced fracture of seventh cervical vertebra, sequela: Secondary | ICD-10-CM | POA: Diagnosis not present

## 2018-12-29 DIAGNOSIS — J181 Lobar pneumonia, unspecified organism: Secondary | ICD-10-CM | POA: Diagnosis not present

## 2018-12-29 DIAGNOSIS — J9621 Acute and chronic respiratory failure with hypoxia: Secondary | ICD-10-CM | POA: Diagnosis not present

## 2018-12-29 DIAGNOSIS — I509 Heart failure, unspecified: Secondary | ICD-10-CM | POA: Diagnosis not present

## 2018-12-29 LAB — CBC
HCT: 25.4 % — ABNORMAL LOW (ref 39.0–52.0)
Hemoglobin: 8 g/dL — ABNORMAL LOW (ref 13.0–17.0)
MCH: 29.5 pg (ref 26.0–34.0)
MCHC: 31.5 g/dL (ref 30.0–36.0)
MCV: 93.7 fL (ref 80.0–100.0)
Platelets: 206 10*3/uL (ref 150–400)
RBC: 2.71 MIL/uL — ABNORMAL LOW (ref 4.22–5.81)
RDW: 13.9 % (ref 11.5–15.5)
WBC: 8.2 10*3/uL (ref 4.0–10.5)
nRBC: 0 % (ref 0.0–0.2)

## 2018-12-29 LAB — VANCOMYCIN, TROUGH: Vancomycin Tr: 19 ug/mL (ref 15–20)

## 2018-12-29 LAB — OCCULT BLOOD X 1 CARD TO LAB, STOOL: Fecal Occult Bld: NEGATIVE

## 2018-12-29 NOTE — Progress Notes (Addendum)
Pulmonary Critical Care Medicine [place]   PULMONARY CRITICAL CARE SERVICE  PROGRESS NOTE  Date of Service: 12/29/2018  Raymond JUSTINIANO  [id number]  DOB: 1965/05/25   DOA: 12/23/2018  Referring Physician: Merton Border, MD  HPI: Raymond Avila is a 53 y.o. male seen for follow up of Acute on Chronic Respiratory Failure.  Patient continues on full support on the ventilator satting well no distress.  Medications: Reviewed on Rounds  Physical Exam:  Vitals: Pulse 82 respirations 18 BP 121/61 O2 sat 99% 1.3  Ventilator Settings ventilator mode AC VC rate of 12 tidal volume 500 PEEP of 8 with an FiO2 of 50%  . General: Comfortable at this time . Eyes: Grossly normal lids, irises & conjunctiva . ENT: grossly tongue is normal . Neck: no obvious mass . Cardiovascular: S1 S2 normal no gallop . Respiratory: No rales or rhonchi noted . Abdomen: soft . Skin: no rash seen on limited exam . Musculoskeletal: not rigid . Psychiatric:unable to assess . Neurologic: no seizure no involuntary movements         Lab Data:   Basic Metabolic Panel: Recent Labs  Lab 12/24/18 0618 12/25/18 0931 12/26/18 0618 12/28/18 0500  NA 141 141 141 138  K 4.4 4.4 4.6 4.5  CL 102 100 98 94*  CO2 29 31 33* 34*  GLUCOSE 103* 127* 101* 107*  BUN 24* 28* 30* 23*  CREATININE 0.33* 0.55* 0.48* 0.46*  CALCIUM 9.5 9.4 9.6 9.0  MG  --  2.3 2.3 2.1  PHOS  --  3.9 2.9 3.1    ABG: Recent Labs  Lab 12/24/18 0015  PHART 7.418  PCO2ART 51.6*  PO2ART 132*  HCO3 32.6*  O2SAT 99.2    Liver Function Tests: Recent Labs  Lab 12/24/18 0618  AST 27  ALT 49*  ALKPHOS 71  BILITOT 0.6  PROT 5.9*  ALBUMIN 1.9*   No results for input(s): LIPASE, AMYLASE in the last 168 hours. No results for input(s): AMMONIA in the last 168 hours.  CBC: Recent Labs  Lab 12/24/18 0618 12/25/18 0931 12/26/18 0618 12/28/18 0500 12/29/18 0418  WBC 5.8 7.6 7.3 6.9 8.2  HGB 8.7* 8.4* 9.1*  7.6* 8.0*  HCT 29.5* 28.0* 31.2* 25.2* 25.4*  MCV 98.7 98.2 98.4 96.6 93.7  PLT 183 193 200 191 206    Cardiac Enzymes: No results for input(s): CKTOTAL, CKMB, CKMBINDEX, TROPONINI in the last 168 hours.  BNP (last 3 results) No results for input(s): BNP in the last 8760 hours.  ProBNP (last 3 results) No results for input(s): PROBNP in the last 8760 hours.  Radiological Exams: Dg Chest Port 1 View  Result Date: 12/28/2018 CLINICAL DATA:  Respiratory failure EXAM: PORTABLE CHEST 1 VIEW COMPARISON:  December 26, 2018 FINDINGS: Tracheostomy device is present. Right PICC line is unchanged. Persistent right pleural effusion and right basilar opacity. Chronic left pleural thickening. No pneumothorax. Stable cardiomediastinal silhouette. IMPRESSION: Similar appearance of right pleural effusion and adjacent basilar atelectasis/consolidation. Electronically Signed   By: Macy Mis M.D.   On: 12/28/2018 08:20    Assessment/Plan Active Problems:   Acute on chronic respiratory failure with hypoxia (HCC)   Lobar pneumonia, unspecified organism (HCC)   Chronic congestive heart failure (HCC)   Closed C7 fracture without spinal cord injury, sequela   Pulmonary embolism and infarction ([place])   1. Acute on chronic respiratory failure hypoxia patient has fever today and will be getting abdominal CT.  No weaning at this  time we will continue aggressive pulmonary toilet supportive measures 2. Lobar pneumonia treated chest x-ray not showing much change in terms of the atelectasis continue aggressive pulmonary toilet 3. Chronic congestive heart failure diurese as tolerated continue with supportive care 4. C7 fracture no changes noted 5. Pulmonary embolism treated we will continue supportive care   I have personally seen and evaluated the patient, evaluated laboratory and imaging results, formulated the assessment and plan and placed orders. The Patient requires high complexity decision making for  assessment and support.  Case was discussed on Rounds with the Respiratory Therapy Staff  Yevonne Pax, MD [facility] Pulmonary Critical Care Medicine Sleep Medicine

## 2018-12-30 DIAGNOSIS — J181 Lobar pneumonia, unspecified organism: Secondary | ICD-10-CM | POA: Diagnosis not present

## 2018-12-30 DIAGNOSIS — S12690S Other displaced fracture of seventh cervical vertebra, sequela: Secondary | ICD-10-CM | POA: Diagnosis not present

## 2018-12-30 DIAGNOSIS — I509 Heart failure, unspecified: Secondary | ICD-10-CM | POA: Diagnosis not present

## 2018-12-30 DIAGNOSIS — J9621 Acute and chronic respiratory failure with hypoxia: Secondary | ICD-10-CM | POA: Diagnosis not present

## 2018-12-30 LAB — COMPREHENSIVE METABOLIC PANEL
ALT: 58 U/L — ABNORMAL HIGH (ref 0–44)
AST: 38 U/L (ref 15–41)
Albumin: 1.5 g/dL — ABNORMAL LOW (ref 3.5–5.0)
Alkaline Phosphatase: 70 U/L (ref 38–126)
Anion gap: 7 (ref 5–15)
BUN: 28 mg/dL — ABNORMAL HIGH (ref 6–20)
CO2: 34 mmol/L — ABNORMAL HIGH (ref 22–32)
Calcium: 8.9 mg/dL (ref 8.9–10.3)
Chloride: 97 mmol/L — ABNORMAL LOW (ref 98–111)
Creatinine, Ser: 0.46 mg/dL — ABNORMAL LOW (ref 0.61–1.24)
GFR calc Af Amer: >60 mL/min (ref >60)
GFR calc non Af Amer: >60 mL/min (ref >60)
Glucose, Bld: 128 mg/dL — ABNORMAL HIGH (ref 70–99)
Potassium: 4.1 mmol/L (ref 3.5–5.1)
Sodium: 138 mmol/L (ref 135–145)
Total Bilirubin: 0.7 mg/dL (ref 0.3–1.2)
Total Protein: 5.1 g/dL — ABNORMAL LOW (ref 6.5–8.1)

## 2018-12-30 LAB — MAGNESIUM: Magnesium: 2.1 mg/dL (ref 1.7–2.4)

## 2018-12-30 LAB — C DIFFICILE QUICK SCREEN W PCR REFLEX
C Diff antigen: NEGATIVE
C Diff interpretation: NOT DETECTED
C Diff toxin: NEGATIVE

## 2018-12-30 LAB — CBC
HCT: 24.7 % — ABNORMAL LOW (ref 39.0–52.0)
Hemoglobin: 7.6 g/dL — ABNORMAL LOW (ref 13.0–17.0)
MCH: 29.5 pg (ref 26.0–34.0)
MCHC: 30.8 g/dL (ref 30.0–36.0)
MCV: 95.7 fL (ref 80.0–100.0)
Platelets: 222 10*3/uL (ref 150–400)
RBC: 2.58 MIL/uL — ABNORMAL LOW (ref 4.22–5.81)
RDW: 14.2 % (ref 11.5–15.5)
WBC: 8.4 10*3/uL (ref 4.0–10.5)
nRBC: 0 % (ref 0.0–0.2)

## 2018-12-30 NOTE — Progress Notes (Signed)
Pulmonary Critical Care Medicine [place]   PULMONARY CRITICAL CARE SERVICE  PROGRESS NOTE  Date of Service: 12/30/2018  Raymond Avila  EPP:[id number]  DOB: 20-Sep-1965   DOA: 12/23/2018  Referring Physician: Merton Border, MD  HPI: Raymond Avila is a 53 y.o. male seen for follow up of Acute on Chronic Respiratory Failure.  Patient has not been tolerating weaning right now is on full support on assist control mode has been requiring 45% FiO2 PEEP remains at 10.  Yesterday apparently patient suffered a rapid response while going down for CT scan  Medications: Reviewed on Rounds  Physical Exam:  Vitals: Temperature 97.4 pulse 77 respiratory rate 21 blood pressure 118/62 saturations 99%  Ventilator Settings mode ventilation assist control FiO2 is 45% PEEP 10  . General: Comfortable at this time . Eyes: Grossly normal lids, irises & conjunctiva . ENT: grossly tongue is normal . Neck: no obvious mass . Cardiovascular: S1 S2 normal no gallop . Respiratory: No rhonchi coarse breath sounds . Abdomen: soft . Skin: no rash seen on limited exam . Musculoskeletal: not rigid . Psychiatric:unable to assess . Neurologic: no seizure no involuntary movements         Lab Data:   Basic Metabolic Panel: Recent Labs  Lab 12/24/18 0618 12/25/18 0931 12/26/18 0618 12/28/18 0500 12/30/18 0814  NA 141 141 141 138 138  K 4.4 4.4 4.6 4.5 4.1  CL 102 100 98 94* 97*  CO2 29 31 33* 34* 34*  GLUCOSE 103* 127* 101* 107* 128*  BUN 24* 28* 30* 23* 28*  CREATININE 0.33* 0.55* 0.48* 0.46* 0.46*  CALCIUM 9.5 9.4 9.6 9.0 8.9  MG  --  2.3 2.3 2.1 2.1  PHOS  --  3.9 2.9 3.1  --     ABG: Recent Labs  Lab 12/24/18 0015  PHART 7.418  PCO2ART 51.6*  PO2ART 132*  HCO3 32.6*  O2SAT 99.2    Liver Function Tests: Recent Labs  Lab 12/24/18 0618 12/30/18 0814  AST 27 38  ALT 49* 58*  ALKPHOS 71 70  BILITOT 0.6 0.7  PROT 5.9* 5.1*  ALBUMIN 1.9* 1.5*   No results  for input(s): LIPASE, AMYLASE in the last 168 hours. No results for input(s): AMMONIA in the last 168 hours.  CBC: Recent Labs  Lab 12/25/18 0931 12/26/18 0618 12/28/18 0500 12/29/18 0418 12/30/18 0814  WBC 7.6 7.3 6.9 8.2 8.4  HGB 8.4* 9.1* 7.6* 8.0* 7.6*  HCT 28.0* 31.2* 25.2* 25.4* 24.7*  MCV 98.2 98.4 96.6 93.7 95.7  PLT 193 200 191 206 222    Cardiac Enzymes: No results for input(s): CKTOTAL, CKMB, CKMBINDEX, TROPONINI in the last 168 hours.  BNP (last 3 results) No results for input(s): BNP in the last 8760 hours.  ProBNP (last 3 results) No results for input(s): PROBNP in the last 8760 hours.  Radiological Exams: No results found.  Assessment/Plan Active Problems:   Acute on chronic respiratory failure with hypoxia (HCC)   Lobar pneumonia, unspecified organism (HCC)   Chronic congestive heart failure (HCC)   Closed C7 fracture without spinal cord injury, sequela   Pulmonary embolism and infarction ([place])   1. Acute on chronic respiratory failure with hypoxia we will continue with full support on assist control mode titrate oxygen continue pulmonary toilet 2. Lobar pneumonia treated we will continue to follow 3. Chronic congestive heart failure at baseline supportive care 4. C7 fracture we will continue to monitor closely 5. Pulmonary embolism treated  I have personally seen and evaluated the patient, evaluated laboratory and imaging results, formulated the assessment and plan and placed orders. The Patient requires high complexity decision making for assessment and support.  Case was discussed on Rounds with the Respiratory Therapy Staff  Allyne Gee, MD [facility] Pulmonary Critical Care Medicine Sleep Medicine

## 2018-12-31 DIAGNOSIS — S12690S Other displaced fracture of seventh cervical vertebra, sequela: Secondary | ICD-10-CM | POA: Diagnosis not present

## 2018-12-31 DIAGNOSIS — J181 Lobar pneumonia, unspecified organism: Secondary | ICD-10-CM | POA: Diagnosis not present

## 2018-12-31 DIAGNOSIS — I509 Heart failure, unspecified: Secondary | ICD-10-CM | POA: Diagnosis not present

## 2018-12-31 DIAGNOSIS — J9621 Acute and chronic respiratory failure with hypoxia: Secondary | ICD-10-CM | POA: Diagnosis not present

## 2018-12-31 LAB — CULTURE, BLOOD (ROUTINE X 2)
Culture: NO GROWTH
Culture: NO GROWTH
Special Requests: ADEQUATE

## 2018-12-31 LAB — D-DIMER, QUANTITATIVE: D-Dimer, Quant: 2.75 ug/mL-FEU — ABNORMAL HIGH (ref 0.00–0.50)

## 2018-12-31 NOTE — Progress Notes (Signed)
Pulmonary Critical Care Medicine [place]   PULMONARY CRITICAL CARE SERVICE  PROGRESS NOTE  Date of Service: 12/31/2018  Raymond Avila  VZD:[id number]  DOB: [birth year]   DOA: 12/23/2018  Referring Physician: Merton Border, MD  HPI: Raymond Avila is a 53 y.o. male seen for follow up of Acute on Chronic Respiratory Failure.  Patient apparently had to go up to 100% FiO2 overnight and increased work of breathing noted apparently now is back down to 60%  Medications: Reviewed on Rounds  Physical Exam:  Vitals: Temperature was up to 101.3 pulse 82 respiratory 24 blood pressure 94/52 saturations 98%  Ventilator Settings mode ventilation assist control FiO2 60% tidal volume 524 PEEP 10  . General: Comfortable at this time . Eyes: Grossly normal lids, irises & conjunctiva . ENT: grossly tongue is normal . Neck: no obvious mass . Cardiovascular: S1 S2 normal no gallop . Respiratory: Coarse rhonchi expansion is equal . Abdomen: soft . Skin: no rash seen on limited exam . Musculoskeletal: not rigid . Psychiatric:unable to assess . Neurologic: no seizure no involuntary movements         Lab Data:   Basic Metabolic Panel: Recent Labs  Lab 12/25/18 0931 12/26/18 0618 12/28/18 0500 12/30/18 0814  NA 141 141 138 138  K 4.4 4.6 4.5 4.1  CL 100 98 94* 97*  CO2 31 33* 34* 34*  GLUCOSE 127* 101* 107* 128*  BUN 28* 30* 23* 28*  CREATININE 0.55* 0.48* 0.46* 0.46*  CALCIUM 9.4 9.6 9.0 8.9  MG 2.3 2.3 2.1 2.1  PHOS 3.9 2.9 3.1  --     ABG: No results for input(s): PHART, PCO2ART, PO2ART, HCO3, O2SAT in the last 168 hours.  Liver Function Tests: Recent Labs  Lab 12/30/18 0814  AST 38  ALT 58*  ALKPHOS 70  BILITOT 0.7  PROT 5.1*  ALBUMIN 1.5*   No results for input(s): LIPASE, AMYLASE in the last 168 hours. No results for input(s): AMMONIA in the last 168 hours.  CBC: Recent Labs  Lab 12/25/18 0931 12/26/18 0618 12/28/18 0500 12/29/18 0418  12/30/18 0814  WBC 7.6 7.3 6.9 8.2 8.4  HGB 8.4* 9.1* 7.6* 8.0* 7.6*  HCT 28.0* 31.2* 25.2* 25.4* 24.7*  MCV 98.2 98.4 96.6 93.7 95.7  PLT 193 200 191 206 222    Cardiac Enzymes: No results for input(s): CKTOTAL, CKMB, CKMBINDEX, TROPONINI in the last 168 hours.  BNP (last 3 results) No results for input(s): BNP in the last 8760 hours.  ProBNP (last 3 results) No results for input(s): PROBNP in the last 8760 hours.  Radiological Exams: No results found.  Assessment/Plan Active Problems:   Acute on chronic respiratory failure with hypoxia (HCC)   Lobar pneumonia, unspecified organism (HCC)   Chronic congestive heart failure (HCC)   Closed C7 fracture without spinal cord injury, sequela   Pulmonary embolism and infarction ([place])   1. Acute on chronic respiratory failure with hypoxia had a bit of decompensation could be related to the fever spike we will continue to assess discussed with primary care team 2. Lobar pneumonia treated would recommend follow-up chest films 3. Chronic congestive heart failure appears to be compensated 4. C7 fracture status post repair 5. Pulmonary embolism treated we will continue with supportive care I may consider a follow-up angio for the PE   I have personally seen and evaluated the patient, evaluated laboratory and imaging results, formulated the assessment and plan and placed orders. The Patient requires high  complexity decision making for assessment and support.  Case was discussed on Rounds with the Respiratory Therapy Staff  Allyne Gee, MD [facility] Pulmonary Critical Care Medicine Sleep Medicine

## 2018-12-31 NOTE — Consult Note (Signed)
Infectious Disease Consultation   [name]  MGQ:[id number]  DOB: December 12, 1965  DOA: 12/23/2018  Requesting physician: Dr.Brown  Reason for consultation: Persistent fever   History of Present Illness: Patient currently has trach in place.  Unable to provide history.  Therefore obtained from medical records and confirmed with the mother at the bedside. Raymond Avila is an 53 y.o. 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  He was admitted to outside hospital on 11/23/2018.  He required anterior cervical disc fusion and despite this developed paraplegia.  Postoperatively patient was intubated and had respiratory complications after that.  He developed pneumonia thought to be secondary to Klebsiella.  He had prolonged ventilation and eventually underwent tracheostomy placement.  Patient also had DVT in right lower extremity and left upper extremity which led to pulmonary embolism.  He was initially on heparin drip and then transitioned to Eliquis.  He also had a feeding tube placed.  Apparently posterior surgical fusion was postponed secondary to the patient's medical condition and inability to stay prone for the surgery because of the respiratory failure.  Due to his medical problems he was transferred to [facility].  After admission here patient has been having fever spikes.  All cultures to date negative.  He was on IV vancomycin, meropenem.  Fluconazole was added.  Review of Systems:  Patient has trach, limited ability to communicate therefore unable to obtain review of systems.   Past Medical History: Past Medical History:  Diagnosis Date  . Acute on chronic respiratory failure with hypoxia (HCC)   . Chronic  congestive heart failure (HCC)   . Closed C7 fracture without spinal cord injury, sequela   . Lobar pneumonia, unspecified organism (HCC)   . Pulmonary embolism and infarction (HCC)   Obesity, schizoaffective disorder, COPD, ADHD, obstructive sleep apnea (noncompliant with CPAP), Tourette's syndrome  Past Surgical History: Recent ACDF surgery, tracheostomy, PEG tube placement  Allergies: No known drug allergies  Social History: No history of alcohol, tobacco or illicit drug abuse.  He has history of ADHD/Tourette's syndrome/OCD  Family History: Family history reviewed with the mother and not relevant.  Physical Exam: Vitals: Temperature 101.1, pulse 88, respiratory rate 21, blood pressure 121/67, pulse oximetry 98%, on 60% FiO2, PEEP of 10. Constitutional: Morbidly obese male, awake, on 60% FiO2 HEENT: PERLA, EOMI, irises appear normal, anicteric sclera, no ear or nose lesions Neck: He has a c-collar in place, has trach CVS: S1-S2, no murmur Respiratory: Coarse breath sounds, no wheezing Abdomen: Obese male, soft nontender, normal bowel sounds Musculoskeletal: Edema of bilateral upper extremities and bilateral lower extremities Neuro: He has weakness in all his extremities. Psych: stable mood and affect, mental status Skin: no rashes   Data reviewed:  I have personally reviewed following labs and imaging studies Labs:  CBC: Recent Labs  Lab 12/25/18 0931 12/26/18 0618 12/28/18 0500 12/29/18 0418 12/30/18 0814  WBC 7.6 7.3 6.9 8.2 8.4  HGB 8.4* 9.1* 7.6* 8.0* 7.6*  HCT 28.0* 31.2* 25.2* 25.4* 24.7*  MCV 98.2 98.4 96.6 93.7 95.7  PLT 193 200 191 206 222    Basic Metabolic Panel: Recent Labs  Lab 12/25/18 0931 12/26/18 0618 12/28/18 0500 12/30/18 0814  NA 141 141 138  138  K 4.4 4.6 4.5 4.1  CL 100 98 94* 97*  CO2 31 33* 34* 34*  GLUCOSE 127* 101* 107* 128*  BUN 28* 30* 23* 28*  CREATININE 0.55* 0.48* 0.46* 0.46*  CALCIUM 9.4 9.6 9.0 8.9  MG 2.3 2.3 2.1 2.1   PHOS 3.9 2.9 3.1  --    GFR CrCl cannot be calculated (Unknown ideal weight.). Liver Function Tests: Recent Labs  Lab 12/30/18 0814  AST 38  ALT 58*  ALKPHOS 70  BILITOT 0.7  PROT 5.1*  ALBUMIN 1.5*   No results for input(s): LIPASE, AMYLASE in the last 168 hours. No results for input(s): AMMONIA in the last 168 hours. Coagulation profile No results for input(s): INR, PROTIME in the last 168 hours.  Cardiac Enzymes: No results for input(s): CKTOTAL, CKMB, CKMBINDEX, TROPONINI in the last 168 hours. BNP: Invalid input(s): POCBNP CBG: No results for input(s): GLUCAP in the last 168 hours. D-Dimer No results for input(s): DDIMER in the last 72 hours. Hgb A1c No results for input(s): HGBA1C in the last 72 hours. Lipid Profile No results for input(s): CHOL, HDL, LDLCALC, TRIG, CHOLHDL, LDLDIRECT in the last 72 hours. Thyroid function studies No results for input(s): TSH, T4TOTAL, T3FREE, THYROIDAB in the last 72 hours.  Invalid input(s): FREET3 Anemia work up No results for input(s): VITAMINB12, FOLATE, FERRITIN, TIBC, IRON, RETICCTPCT in the last 72 hours. Urinalysis    Component Value Date/Time   COLORURINE AMBER (A) 12/25/2018 0559   APPEARANCEUR HAZY (A) 12/25/2018 0559   LABSPEC 1.030 12/25/2018 0559   PHURINE 5.0 12/25/2018 0559   GLUCOSEU NEGATIVE 12/25/2018 0559   HGBUR NEGATIVE 12/25/2018 0559   BILIRUBINUR SMALL (A) 12/25/2018 0559   KETONESUR NEGATIVE 12/25/2018 0559   PROTEINUR 30 (A) 12/25/2018 0559   NITRITE NEGATIVE 12/25/2018 0559   LEUKOCYTESUR NEGATIVE 12/25/2018 0559     Microbiology Recent Results (from the past 240 hour(s))  Culture, respiratory     Status: None   Collection Time: 12/24/18  5:09 PM   Specimen: Tracheal Aspirate; Respiratory  Result Value Ref Range Status   Specimen Description TRACHEAL ASPIRATE  Final   Special Requests NONE  Final   Gram Stain   Final    RARE WBC PRESENT, PREDOMINANTLY PMN RARE GRAM POSITIVE RODS     Culture   Final    FEW Consistent with normal respiratory flora. Performed at [facility], [address] [place], [place] [address]    Report Status 12/27/2018 FINAL  Final  Culture, Urine     Status: None   Collection Time: 12/25/18  5:45 AM   Specimen: Urine, Random  Result Value Ref Range Status   Specimen Description URINE, RANDOM  Final   Special Requests NONE  Final   Culture   Final    NO GROWTH Performed at [facility], [address] [place], [place] [address]    Report Status 12/26/2018 FINAL  Final  Culture, blood (routine x 2)     Status: None   Collection Time: 12/26/18 12:27 PM   Specimen: BLOOD RIGHT HAND  Result Value Ref Range Status   Specimen Description BLOOD RIGHT HAND  Final   Special Requests   Final    BOTTLES DRAWN AEROBIC AND ANAEROBIC Blood Culture adequate volume   Culture   Final    NO GROWTH 5 DAYS Performed at [facility], [address] [place], [place] [address]    Report Status 12/31/2018 FINAL  Final  Culture, blood (routine x 2)     Status: None   Collection Time: 12/26/18  6:10 PM   Specimen: BLOOD  Result Value Ref Range Status   Specimen Description BLOOD RIGHT ANTECUBITAL  Final   Special Requests   Final    BOTTLES DRAWN AEROBIC AND ANAEROBIC Blood Culture results may not be optimal due to an inadequate volume of blood received in culture bottles   Culture   Final    NO GROWTH 5 DAYS Performed at [facility], [address], [place], [place] [address]    Report Status 12/31/2018 FINAL  Final  C difficile quick scan w PCR reflex     Status: None   Collection Time: 12/30/18  5:22 AM   Specimen: STOOL  Result Value Ref Range Status   C Diff antigen NEGATIVE NEGATIVE Final   C Diff toxin NEGATIVE NEGATIVE Final   C Diff interpretation No C. difficile detected.  Final    Comment: Performed at [facility], [address] [place], [place] [address]       Inpatient Medications:   Please  see MAR   Impression/Recommendations Active Problems:   Acute on chronic respiratory failure with hypoxia (HCC)   Lobar pneumonia, unspecified organism (HCC) Persistent fever   Chronic congestive heart failure (HCC)   Closed C7 fracture status post ACDF with paraplegia   Pulmonary embolism and infarction (HCC) Right lower extremity/left upper extremity DVT COPD Obstructive sleep apnea Morbid obesity Protein calorie malnutrition Dysphagia History of schizoaffective disorder  Acute on chronic hypoxemic respiratory failure, ventilator dependent: Multifactorial.  Pneumonia suspected on chest x-ray, also has obstructive sleep apnea obesity hypoventilation syndrome, congestive heart failure, also had recent DVT and PE.  Patient last night had increased work of breathing and was on 100% FiO2.  Now on 60% FiO2.  Recommended CT of the chest abdomen and pelvis given the ongoing fevers and respiratory status.  However, patient could not tolerate the CT therefore unable to get imaging.  Chest x-ray on 12/28/2018 per report showing right pleural effusion, adjacent basilar atelectasis/consolidation.  He is currently on IV vancomycin, meropenem.  He continues to have fevers despite being on antibiotics.  Fluconazole was also added.  Tracheal aspirate cultures showing rare gram-positive rods, normal respiratory flora.   Pneumonia: Chest x-ray showed findings concerning for pleural effusion, pneumonia?  However, respiratory culture showing normal flora.  On treatment with IV vancomycin meropenem, fluconazole.  He already had 5 days of antimicrobial treatment.  At this time would recommend to discontinue the antibiotics and monitor.  If his respiratory status is worsening would recommend to attempt CT imaging for better evaluation.  However, previously CT attempt had to be aborted because the patient could not tolerate.  Pulmonary following.  Fever: Patient has been having persistent fever since his admission  here despite being on antibiotics.  All cultures to date negative.  Respiratory cultures showing normal flora.  Aspiration?  At this time no clear etiology for the fevers.  However, it is possible that he could have progressively worsening DVT, PE?  He is on treatment with Eliquis.  Not sure if the Eliquis is working.  I suggested to check D-dimer and Doppler ultrasound of his extremities.  Check echocardiogram.  At this time since he already got 5 days of antimicrobials will recommend to stop and monitor off antibiotics.  Right lower extremity DVT/left upper extremity DVT/pulmonary embolism: He was on IV heparin at the outside facility now  transitioned to Eliquis.  He is continuing to have fevers despite being on antibiotics.  If the DVT is progressing this could also cause fevers.  Recommended to check D-dimer, ultrasound of the extremities.  If it shows that the DVT is progressing then would recommend to transition to IV heparin.  Further management per the primary team.  C7 fracture status post ACDF surgery: Surgical site stable.  Unfortunately has paraplegia with weakness.  Continue supportive management per the primary team.  COPD: Continue medication and management per primary team and pulmonary.  Morbid obesity/obstructive sleep apnea: Patient admits to not using the CPAP.  Continue management per primary team and pulmonary.  Protein calorie malnutrition: Continue management per primary team.  Dysphagia: Unfortunately due to his dysphagia he is high risk for aspiration and worsening respiratory failure secondary to aspiration pneumonia.  Schizoaffective disorder: Medication and management per the primary team.   Due to his complex medical problems he is very high risk for worsening anticoagulation.  Plan of care discussed with the patient and mother at the bedside.  Thank you for involving [place] in the care of the patient.   Yaakov Guthrie, M.D.  12/31/2018, 3:23 PM

## 2019-01-01 ENCOUNTER — Encounter (HOSPITAL_BASED_OUTPATIENT_CLINIC_OR_DEPARTMENT_OTHER): Payer: Medicare Other

## 2019-01-01 ENCOUNTER — Ambulatory Visit (HOSPITAL_COMMUNITY): Payer: Medicare Other | Attending: General Practice

## 2019-01-01 DIAGNOSIS — J181 Lobar pneumonia, unspecified organism: Secondary | ICD-10-CM | POA: Diagnosis not present

## 2019-01-01 DIAGNOSIS — I509 Heart failure, unspecified: Secondary | ICD-10-CM | POA: Diagnosis not present

## 2019-01-01 DIAGNOSIS — R509 Fever, unspecified: Secondary | ICD-10-CM | POA: Diagnosis not present

## 2019-01-01 DIAGNOSIS — Z86718 Personal history of other venous thrombosis and embolism: Secondary | ICD-10-CM

## 2019-01-01 DIAGNOSIS — J9621 Acute and chronic respiratory failure with hypoxia: Secondary | ICD-10-CM | POA: Diagnosis not present

## 2019-01-01 DIAGNOSIS — S12690S Other displaced fracture of seventh cervical vertebra, sequela: Secondary | ICD-10-CM | POA: Diagnosis not present

## 2019-01-01 LAB — BASIC METABOLIC PANEL
Anion gap: 9 (ref 5–15)
BUN: 27 mg/dL — ABNORMAL HIGH (ref 6–20)
CO2: 34 mmol/L — ABNORMAL HIGH (ref 22–32)
Calcium: 8.9 mg/dL (ref 8.9–10.3)
Chloride: 96 mmol/L — ABNORMAL LOW (ref 98–111)
Creatinine, Ser: 0.39 mg/dL — ABNORMAL LOW (ref 0.61–1.24)
GFR calc Af Amer: >60 mL/min (ref >60)
GFR calc non Af Amer: >60 mL/min (ref >60)
Glucose, Bld: 134 mg/dL — ABNORMAL HIGH (ref 70–99)
Potassium: 4.1 mmol/L (ref 3.5–5.1)
Sodium: 139 mmol/L (ref 135–145)

## 2019-01-01 LAB — CBC
HCT: 25.3 % — ABNORMAL LOW (ref 39.0–52.0)
Hemoglobin: 7.5 g/dL — ABNORMAL LOW (ref 13.0–17.0)
MCH: 29 pg (ref 26.0–34.0)
MCHC: 29.6 g/dL — ABNORMAL LOW (ref 30.0–36.0)
MCV: 97.7 fL (ref 80.0–100.0)
Platelets: 332 10*3/uL (ref 150–400)
RBC: 2.59 MIL/uL — ABNORMAL LOW (ref 4.22–5.81)
RDW: 14.4 % (ref 11.5–15.5)
WBC: 9.7 10*3/uL (ref 4.0–10.5)
nRBC: 0 % (ref 0.0–0.2)

## 2019-01-01 LAB — LACTIC ACID, PLASMA: Lactic Acid, Venous: 0.7 mmol/L (ref 0.5–1.9)

## 2019-01-01 LAB — MAGNESIUM: Magnesium: 2 mg/dL (ref 1.7–2.4)

## 2019-01-01 NOTE — Progress Notes (Signed)
  Echocardiogram 2D Echocardiogram has been performed.  Raymond Avila 01/01/2019, 10:05 AM

## 2019-01-01 NOTE — Progress Notes (Signed)
Venous duplex lower ext  has been completed. Refer to [facility] under chart review to view preliminary results.   01/01/2019  10:50 AM Zamarian Scarano, Bonnye Fava

## 2019-01-01 NOTE — Progress Notes (Addendum)
Pulmonary Critical Care Medicine [facility]   PULMONARY CRITICAL CARE SERVICE  PROGRESS NOTE  Date of Service: 01/01/2019  Raymond Avila  NOB:[id number]  DOB: [birth year]   DOA: 12/23/2018  Referring Physician: [name], MD  HPI: Raymond Avila is a 53 y.o. male seen for follow up of Acute on Chronic Respiratory Failure.  Patient unable to wean today remains on full support on the ventilator doing well with no distress.  Medications: Reviewed on Rounds  Physical Exam:  Vitals: Pulse 85 respirations 21 BP 126/63 O2 sat 98% temp 100.6  Ventilator Settings ventilator mode AC VC rate of 12 tidal 1 500 PEEP of 10 with an FiO2 of 60%  . General: Comfortable at this time . Eyes: Grossly normal lids, irises & conjunctiva . ENT: grossly tongue is normal . Neck: no obvious mass . Cardiovascular: S1 S2 normal no gallop . Respiratory: No rales rhonchi noted . Abdomen: soft . Skin: no rash seen on limited exam . Musculoskeletal: not rigid . Psychiatric:unable to assess . Neurologic: no seizure no involuntary movements         Lab Data:   Basic Metabolic Panel: Recent Labs  Lab 12/26/18 0618 12/28/18 0500 12/30/18 0814 01/01/19 0822  NA 141 138 138 139  K 4.6 4.5 4.1 4.1  CL 98 94* 97* 96*  CO2 33* 34* 34* 34*  GLUCOSE 101* 107* 128* 134*  BUN 30* 23* 28* 27*  CREATININE 0.48* 0.46* 0.46* 0.39*  CALCIUM 9.6 9.0 8.9 8.9  MG 2.3 2.1 2.1 2.0  PHOS 2.9 3.1  --   --     ABG: No results for input(s): PHART, PCO2ART, PO2ART, HCO3, O2SAT in the last 168 hours.  Liver Function Tests: Recent Labs  Lab 12/30/18 0814  AST 38  ALT 58*  ALKPHOS 70  BILITOT 0.7  PROT 5.1*  ALBUMIN 1.5*   No results for input(s): LIPASE, AMYLASE in the last 168 hours. No results for input(s): AMMONIA in the last 168 hours.  CBC: Recent Labs  Lab 12/26/18 0618 12/28/18 0500 12/29/18 0418 12/30/18 0814 01/01/19 0822  WBC 7.3 6.9 8.2 8.4 9.7  HGB 9.1* 7.6*  8.0* 7.6* 7.5*  HCT 31.2* 25.2* 25.4* 24.7* 25.3*  MCV 98.4 96.6 93.7 95.7 97.7  PLT 200 191 206 222 332    Cardiac Enzymes: No results for input(s): CKTOTAL, CKMB, CKMBINDEX, TROPONINI in the last 168 hours.  BNP (last 3 results) No results for input(s): BNP in the last 8760 hours.  ProBNP (last 3 results) No results for input(s): PROBNP in the last 8760 hours.  Radiological Exams: Vas [place] Lower Extremity Venous (dvt)  Result Date: 01/01/2019  Lower Venous Study Indications: Swelling.  Comparison Study: Right LEV done on 02/03/09 - negative Performing Technologist: Resaca Sink Sturdivant RDMS, RVT  Examination Guidelines: A complete evaluation includes B-mode imaging, spectral Doppler, color Doppler, and power Doppler as needed of all accessible portions of each vessel. Bilateral testing is considered an integral part of a complete examination. Limited examinations for reoccurring indications may be performed as noted.  +---------+---------------+---------+-----------+----------+--------------+ RIGHT    CompressibilityPhasicitySpontaneityPropertiesThrombus Aging +---------+---------------+---------+-----------+----------+--------------+ CFV      Full           Yes      Yes                                 +---------+---------------+---------+-----------+----------+--------------+ SFJ      Full                                                        +---------+---------------+---------+-----------+----------+--------------+  FV Prox  Full                                                        +---------+---------------+---------+-----------+----------+--------------+ FV Mid   Full                                                        +---------+---------------+---------+-----------+----------+--------------+ FV DistalFull                                                        +---------+---------------+---------+-----------+----------+--------------+ PFV      Full                                                         +---------+---------------+---------+-----------+----------+--------------+ POP      Full           Yes      Yes                                 +---------+---------------+---------+-----------+----------+--------------+ PTV      Full                                                        +---------+---------------+---------+-----------+----------+--------------+ PERO     Full                                                        +---------+---------------+---------+-----------+----------+--------------+   +---------+---------------+---------+-----------+----------+--------------+ LEFT     CompressibilityPhasicitySpontaneityPropertiesThrombus Aging +---------+---------------+---------+-----------+----------+--------------+ CFV      Full           Yes      Yes                                 +---------+---------------+---------+-----------+----------+--------------+ SFJ      Full                                                        +---------+---------------+---------+-----------+----------+--------------+ FV Prox  Full                                                        +---------+---------------+---------+-----------+----------+--------------+  FV Mid   Full                                                        +---------+---------------+---------+-----------+----------+--------------+ FV DistalFull                                                        +---------+---------------+---------+-----------+----------+--------------+ PFV      Full                                                        +---------+---------------+---------+-----------+----------+--------------+ POP      Full           Yes      Yes                                 +---------+---------------+---------+-----------+----------+--------------+ PTV      Full                                                         +---------+---------------+---------+-----------+----------+--------------+ PERO     Full                                                        +---------+---------------+---------+-----------+----------+--------------+     Summary: Right: There is no evidence of deep vein thrombosis in the lower extremity. No cystic structure found in the popliteal fossa. Left: There is no evidence of deep vein thrombosis in the lower extremity. No cystic structure found in the popliteal fossa.  *See table(s) above for measurements and observations.    Preliminary     Assessment/Plan Active Problems:   Acute on chronic respiratory failure with hypoxia (HCC)   Lobar pneumonia, unspecified organism (HCC)   Chronic congestive heart failure (HCC)   Closed C7 fracture without spinal cord injury, sequela   Pulmonary embolism and infarction ([place])   1. Acute on chronic respiratory failure with hypoxia patient is running low-grade fever today currently satting well on full support with a PEEP of 10.  Continue supportive measures and pulmonary toileting 2. Lobar pneumonia treated would recommend follow-up chest films 3. Chronic congestive heart failure appears to be compensated 4. C7 fracture status post repair 5. Pulmonary embolism treated we will continue with supportive care I may consider a follow-up angio for the PE   I have personally seen and evaluated the patient, evaluated laboratory and imaging results, formulated the assessment and plan and placed orders. The Patient requires high complexity decision making for assessment and support.  Case was discussed on Rounds with the Respiratory Therapy Staff  Allyne Gee, MD [facility]  Pulmonary Critical Care Medicine Sleep Medicine

## 2019-01-02 ENCOUNTER — Other Ambulatory Visit (HOSPITAL_COMMUNITY): Payer: Medicare Other

## 2019-01-02 DIAGNOSIS — S12690S Other displaced fracture of seventh cervical vertebra, sequela: Secondary | ICD-10-CM | POA: Diagnosis not present

## 2019-01-02 DIAGNOSIS — J9621 Acute and chronic respiratory failure with hypoxia: Secondary | ICD-10-CM | POA: Diagnosis not present

## 2019-01-02 DIAGNOSIS — J181 Lobar pneumonia, unspecified organism: Secondary | ICD-10-CM | POA: Diagnosis not present

## 2019-01-02 DIAGNOSIS — I509 Heart failure, unspecified: Secondary | ICD-10-CM | POA: Diagnosis not present

## 2019-01-02 NOTE — Progress Notes (Addendum)
[facility] [place]   PULMONARY CRITICAL CARE SERVICE  PROGRESS NOTE  Date of Service: 01/02/2019  Raymond BeachScott D Rabenold  VWU:[id number]  DOB: 12/29/1965   DOA: 12/23/2018  Referring Physician: Carron CurieAli Hijazi, MD  HPI: Raymond Avila is a 53 y.o. male seen for follow up of Acute on Chronic Respiratory Failure.  Patient had episode of desaturation on 60% FiO2 and was increased to 80%.  Patient also noted to have temp of 103 overnight.  Remains on full support at this time.  Medications: Reviewed on Rounds  Physical Exam:  Vitals: Pulse 88 respirations 21 BP 137/73 O2 sat 99% temp 101.3  Ventilator Settings ventilator mode AC VC with FiO2 80% PEEP of 10.  Marland Kitchen. General: Comfortable at this time . Eyes: Grossly normal lids, irises & conjunctiva . ENT: grossly tongue is normal . Neck: no obvious mass . Cardiovascular: S1 S2 normal no gallop . Respiratory: Coarse breath sounds . Abdomen: soft . Skin: no rash seen on limited exam . Musculoskeletal: not rigid . Psychiatric:unable to assess . Neurologic: no seizure no involuntary movements         Lab Data:   Basic Metabolic Panel: Recent Labs  Lab 12/28/18 0500 12/30/18 0814 01/01/19 0822  NA 138 138 139  K 4.5 4.1 4.1  CL 94* 97* 96*  CO2 34* 34* 34*  GLUCOSE 107* 128* 134*  BUN 23* 28* 27*  CREATININE 0.46* 0.46* 0.39*  CALCIUM 9.0 8.9 8.9  MG 2.1 2.1 2.0  PHOS 3.1  --   --     ABG: No results for input(s): PHART, PCO2ART, PO2ART, HCO3, O2SAT in the last 168 hours.  Liver Function Tests: Recent Labs  Lab 12/30/18 0814  AST 38  ALT 58*  ALKPHOS 70  BILITOT 0.7  PROT 5.1*  ALBUMIN 1.5*   No results for input(s): LIPASE, AMYLASE in the last 168 hours. No results for input(s): AMMONIA in the last 168 hours.  CBC: Recent Labs  Lab 12/28/18 0500 12/29/18 0418 12/30/18 0814 01/01/19 0822  WBC 6.9 8.2 8.4 9.7  HGB 7.6* 8.0* 7.6* 7.5*  HCT 25.2* 25.4* 24.7* 25.3*   MCV 96.6 93.7 95.7 97.7  PLT 191 206 222 332    Cardiac Enzymes: No results for input(s): CKTOTAL, CKMB, CKMBINDEX, TROPONINI in the last 168 hours.  BNP (last 3 results) No results for input(s): BNP in the last 8760 hours.  ProBNP (last 3 results) No results for input(s): PROBNP in the last 8760 hours.  Radiological Exams: Dg Chest Port 1 View  Result Date: 01/02/2019 CLINICAL DATA:  Oxygen desaturation. EXAM: PORTABLE CHEST 1 VIEW COMPARISON:  In 16 20 FINDINGS: 0854 hours. Low lung volumes with asymmetric elevation right hemidiaphragm. Marked progression of bibasilar collapse/consolidation. Cardiopericardial silhouette is at upper limits of normal for size. The visualized bony structures of the thorax are intact. Telemetry leads overlie the chest. IMPRESSION: Low volume film with marked progression of bibasilar collapse/consolidation. Electronically Signed   By: Kennith CenterEric  Mansell M.D.   On: 01/02/2019 09:30   Vas [place] Lower Extremity Venous (dvt)  Result Date: 01/01/2019  Lower Venous Study Indications: Swelling.  Comparison Study: Right LEV done on 02/03/09 - negative Performing Technologist: [place] Sinkita Sturdivant RDMS, RVT  Examination Guidelines: A complete evaluation includes B-mode imaging, spectral Doppler, color Doppler, and power Doppler as needed of all accessible portions of each vessel. Bilateral testing is considered an integral part of a complete examination. Limited examinations for reoccurring indications may be  performed as noted.  +---------+---------------+---------+-----------+----------+--------------+ RIGHT    CompressibilityPhasicitySpontaneityPropertiesThrombus Aging +---------+---------------+---------+-----------+----------+--------------+ CFV      Full           Yes      Yes                                 +---------+---------------+---------+-----------+----------+--------------+ SFJ      Full                                                         +---------+---------------+---------+-----------+----------+--------------+ FV Prox  Full                                                        +---------+---------------+---------+-----------+----------+--------------+ FV Mid   Full                                                        +---------+---------------+---------+-----------+----------+--------------+ FV DistalFull                                                        +---------+---------------+---------+-----------+----------+--------------+ PFV      Full                                                        +---------+---------------+---------+-----------+----------+--------------+ POP      Full           Yes      Yes                                 +---------+---------------+---------+-----------+----------+--------------+ PTV      Full                                                        +---------+---------------+---------+-----------+----------+--------------+ PERO     Full                                                        +---------+---------------+---------+-----------+----------+--------------+   +---------+---------------+---------+-----------+----------+--------------+ LEFT     CompressibilityPhasicitySpontaneityPropertiesThrombus Aging +---------+---------------+---------+-----------+----------+--------------+ CFV      Full           Yes      Yes                                 +---------+---------------+---------+-----------+----------+--------------+  SFJ      Full                                                        +---------+---------------+---------+-----------+----------+--------------+ FV Prox  Full                                                        +---------+---------------+---------+-----------+----------+--------------+ FV Mid   Full                                                         +---------+---------------+---------+-----------+----------+--------------+ FV DistalFull                                                        +---------+---------------+---------+-----------+----------+--------------+ PFV      Full                                                        +---------+---------------+---------+-----------+----------+--------------+ POP      Full           Yes      Yes                                 +---------+---------------+---------+-----------+----------+--------------+ PTV      Full                                                        +---------+---------------+---------+-----------+----------+--------------+ PERO     Full                                                        +---------+---------------+---------+-----------+----------+--------------+     Summary: Right: There is no evidence of deep vein thrombosis in the lower extremity. No cystic structure found in the popliteal fossa. Left: There is no evidence of deep vein thrombosis in the lower extremity. No cystic structure found in the popliteal fossa.  *See table(s) above for measurements and observations. Electronically signed by Lemar Livings MD on 01/01/2019 at 5:45:54 PM.    Final     Assessment/Plan Active Problems:   Acute on chronic respiratory failure with hypoxia (HCC)   Lobar pneumonia, unspecified organism (HCC)   Chronic congestive heart failure (HCC)   Closed C7 fracture without spinal cord injury, sequela  Pulmonary embolism and infarction ([place])   1. Acute on chronic respiratory failure with hypoxia patient remains on full support at this time with increased FiO2 80%.  Patient appears to be decompensating will increase support and continue pulmonary toilet at this time.  Primary care team continues to manage. 2. Lobar pneumonia treated continue supportive measures 3. Chronic congestive heart failure appears to be compensated 4. C7 fracture status post  repair 5. Pulmonary embolism treated continue to monitor, patient may follow-up in GI.   I have personally seen and evaluated the patient, evaluated laboratory and imaging results, formulated the assessment and plan and placed orders. The Patient requires high complexity decision making for assessment and support.  Case was discussed on Rounds with the Respiratory Therapy Staff  Allyne Gee, MD [facility] Pulmonary Critical Care Medicine Sleep Medicine

## 2019-01-03 DIAGNOSIS — J9621 Acute and chronic respiratory failure with hypoxia: Secondary | ICD-10-CM | POA: Diagnosis not present

## 2019-01-03 DIAGNOSIS — S12690S Other displaced fracture of seventh cervical vertebra, sequela: Secondary | ICD-10-CM | POA: Diagnosis not present

## 2019-01-03 DIAGNOSIS — I509 Heart failure, unspecified: Secondary | ICD-10-CM | POA: Diagnosis not present

## 2019-01-03 DIAGNOSIS — J181 Lobar pneumonia, unspecified organism: Secondary | ICD-10-CM | POA: Diagnosis not present

## 2019-01-03 LAB — CBC
HCT: 25.3 % — ABNORMAL LOW (ref 39.0–52.0)
Hemoglobin: 7.6 g/dL — ABNORMAL LOW (ref 13.0–17.0)
MCH: 28.7 pg (ref 26.0–34.0)
MCHC: 30 g/dL (ref 30.0–36.0)
MCV: 95.5 fL (ref 80.0–100.0)
Platelets: 370 10*3/uL (ref 150–400)
RBC: 2.65 MIL/uL — ABNORMAL LOW (ref 4.22–5.81)
RDW: 14.3 % (ref 11.5–15.5)
WBC: 8.9 10*3/uL (ref 4.0–10.5)
nRBC: 0 % (ref 0.0–0.2)

## 2019-01-03 LAB — LACTATE DEHYDROGENASE: LDH: 116 U/L (ref 98–192)

## 2019-01-03 LAB — BASIC METABOLIC PANEL
Anion gap: 9 (ref 5–15)
BUN: 22 mg/dL — ABNORMAL HIGH (ref 6–20)
CO2: 40 mmol/L — ABNORMAL HIGH (ref 22–32)
Calcium: 9 mg/dL (ref 8.9–10.3)
Chloride: 90 mmol/L — ABNORMAL LOW (ref 98–111)
Creatinine, Ser: 0.31 mg/dL — ABNORMAL LOW (ref 0.61–1.24)
GFR calc Af Amer: >60 mL/min (ref >60)
GFR calc non Af Amer: >60 mL/min (ref >60)
Glucose, Bld: 142 mg/dL — ABNORMAL HIGH (ref 70–99)
Potassium: 3.4 mmol/L — ABNORMAL LOW (ref 3.5–5.1)
Sodium: 139 mmol/L (ref 135–145)

## 2019-01-03 LAB — MAGNESIUM: Magnesium: 1.8 mg/dL (ref 1.7–2.4)

## 2019-01-03 LAB — SEDIMENTATION RATE: Sed Rate: 105 mm/hr — ABNORMAL HIGH (ref 0–16)

## 2019-01-03 LAB — C-REACTIVE PROTEIN: CRP: 8.9 mg/dL — ABNORMAL HIGH (ref <1.0)

## 2019-01-03 NOTE — Progress Notes (Signed)
Pulmonary Critical Care Medicine [facility] [place]   PULMONARY CRITICAL CARE SERVICE  PROGRESS NOTE  Date of Service: 01/03/2019  Raymond Avila  HQI:[id number]  DOB: [birth year]   DOA: 12/23/2018  Referring Physician: [name], MD  HPI: Raymond Avila is a 53 y.o. male seen for follow up of Acute on Chronic Respiratory Failure.  Patient is on full support on assist control mode on 50% FiO2 and PEEP is at 10 patient has not been able to tolerate weaning at all.  Lower extremity scan was done negative for DVT  Medications: Reviewed on Rounds  Physical Exam:  Vitals: Temperature 99.9 pulse 88 respiratory rate 16 blood pressure 130/60 saturations 100%  Ventilator Settings mode ventilation assist control FiO2 50% tidal volume 562 PEEP 10  . General: Comfortable at this time . Eyes: Grossly normal lids, irises & conjunctiva . ENT: grossly tongue is normal . Neck: no obvious mass . Cardiovascular: S1 S2 normal no gallop . Respiratory: No rhonchi coarse breath sounds are noted at this time . Abdomen: soft . Skin: no rash seen on limited exam . Musculoskeletal: not rigid . Psychiatric:unable to assess . Neurologic: no seizure no involuntary movements         Lab Data:   Basic Metabolic Panel: Recent Labs  Lab 12/28/18 0500 12/30/18 0814 01/01/19 0822 01/03/19 0946  NA 138 138 139 139  K 4.5 4.1 4.1 3.4*  CL 94* 97* 96* 90*  CO2 34* 34* 34* 40*  GLUCOSE 107* 128* 134* 142*  BUN 23* 28* 27* 22*  CREATININE 0.46* 0.46* 0.39* 0.31*  CALCIUM 9.0 8.9 8.9 9.0  MG 2.1 2.1 2.0 1.8  PHOS 3.1  --   --   --     ABG: No results for input(s): PHART, PCO2ART, PO2ART, HCO3, O2SAT in the last 168 hours.  Liver Function Tests: Recent Labs  Lab 12/30/18 0814  AST 38  ALT 58*  ALKPHOS 70  BILITOT 0.7  PROT 5.1*  ALBUMIN 1.5*   No results for input(s): LIPASE, AMYLASE in the last 168 hours. No results for input(s): AMMONIA in the last 168  hours.  CBC: Recent Labs  Lab 12/28/18 0500 12/29/18 0418 12/30/18 0814 01/01/19 0822 01/03/19 0946  WBC 6.9 8.2 8.4 9.7 8.9  HGB 7.6* 8.0* 7.6* 7.5* 7.6*  HCT 25.2* 25.4* 24.7* 25.3* 25.3*  MCV 96.6 93.7 95.7 97.7 95.5  PLT 191 206 222 332 370    Cardiac Enzymes: No results for input(s): CKTOTAL, CKMB, CKMBINDEX, TROPONINI in the last 168 hours.  BNP (last 3 results) No results for input(s): BNP in the last 8760 hours.  ProBNP (last 3 results) No results for input(s): PROBNP in the last 8760 hours.  Radiological Exams: Dg Chest Port 1 View  Result Date: 01/02/2019 CLINICAL DATA:  Oxygen desaturation. EXAM: PORTABLE CHEST 1 VIEW COMPARISON:  In 16 20 FINDINGS: 0854 hours. Low lung volumes with asymmetric elevation right hemidiaphragm. Marked progression of bibasilar collapse/consolidation. Cardiopericardial silhouette is at upper limits of normal for size. The visualized bony structures of the thorax are intact. Telemetry leads overlie the chest. IMPRESSION: Low volume film with marked progression of bibasilar collapse/consolidation. Electronically Signed   By: Kennith Center M.D.   On: 01/02/2019 09:30    Assessment/Plan Active Problems:   Acute on chronic respiratory failure with hypoxia (HCC)   Lobar pneumonia, unspecified organism (HCC)   Chronic congestive heart failure (HCC)   Closed C7 fracture without spinal cord injury, sequela  Pulmonary embolism and infarction ([place])   1. Acute on chronic respiratory failure hypoxia plan continue with full vent support however we will also continue to assess the RSB I for weaning 2. Lobar pneumonia treated clinically is showing minimal improvement with increasing collapse noted in the lower basilar areas. 3. Chronic congestive heart failure at baseline monitor fluid status closely 4. C7 fracture supportive care 5. Pulmonary embolism latest lower extremity scan shows no DVT patient's not able to do CT angio   I have  personally seen and evaluated the patient, evaluated laboratory and imaging results, formulated the assessment and plan and placed orders. The Patient requires high complexity decision making for assessment and support.  Case was discussed on Rounds with the Respiratory Therapy Staff  Allyne Gee, MD [facility] Pulmonary Critical Care Medicine Sleep Medicine

## 2019-01-04 ENCOUNTER — Other Ambulatory Visit (HOSPITAL_COMMUNITY): Payer: Medicare Other

## 2019-01-04 DIAGNOSIS — J9621 Acute and chronic respiratory failure with hypoxia: Secondary | ICD-10-CM | POA: Diagnosis not present

## 2019-01-04 DIAGNOSIS — I509 Heart failure, unspecified: Secondary | ICD-10-CM | POA: Diagnosis not present

## 2019-01-04 DIAGNOSIS — S12690S Other displaced fracture of seventh cervical vertebra, sequela: Secondary | ICD-10-CM | POA: Diagnosis not present

## 2019-01-04 DIAGNOSIS — J181 Lobar pneumonia, unspecified organism: Secondary | ICD-10-CM | POA: Diagnosis not present

## 2019-01-04 LAB — CBC WITH DIFFERENTIAL/PLATELET
Abs Immature Granulocytes: 0.06 10*3/uL (ref 0.00–0.07)
Basophils Absolute: 0 10*3/uL (ref 0.0–0.1)
Basophils Relative: 0 %
Eosinophils Absolute: 0.2 10*3/uL (ref 0.0–0.5)
Eosinophils Relative: 3 %
HCT: 25.8 % — ABNORMAL LOW (ref 39.0–52.0)
Hemoglobin: 7.9 g/dL — ABNORMAL LOW (ref 13.0–17.0)
Immature Granulocytes: 1 %
Lymphocytes Relative: 9 %
Lymphs Abs: 0.9 10*3/uL (ref 0.7–4.0)
MCH: 29.2 pg (ref 26.0–34.0)
MCHC: 30.6 g/dL (ref 30.0–36.0)
MCV: 95.2 fL (ref 80.0–100.0)
Monocytes Absolute: 1.1 10*3/uL — ABNORMAL HIGH (ref 0.1–1.0)
Monocytes Relative: 12 %
Neutro Abs: 7.1 10*3/uL (ref 1.7–7.7)
Neutrophils Relative %: 75 %
Platelets: 465 10*3/uL — ABNORMAL HIGH (ref 150–400)
RBC: 2.71 MIL/uL — ABNORMAL LOW (ref 4.22–5.81)
RDW: 14.4 % (ref 11.5–15.5)
WBC: 9.4 10*3/uL (ref 4.0–10.5)
nRBC: 0 % (ref 0.0–0.2)

## 2019-01-04 LAB — POTASSIUM: Potassium: 4.1 mmol/L (ref 3.5–5.1)

## 2019-01-04 NOTE — Progress Notes (Signed)
Pulmonary Critical Care Medicine [facility] [place]   PULMONARY CRITICAL CARE SERVICE  PROGRESS NOTE  Date of Service: 01/04/2019  Raymond Avila  WUJ:[id number]  DOB: [birth year]   DOA: 12/23/2018  Referring Physician: [name], MD  HPI: Raymond Avila is a 53 y.o. male seen for follow up of Acute on Chronic Respiratory Failure.  Patient currently is spiking fevers temperature was up back to 101.5 he is on the ventilator and full support right now on assist control mode with an FiO2 of 30%  Medications: Reviewed on Rounds  Physical Exam:  Vitals: Temperature 101.5 pulse 97 respiratory rate 20 blood pressure 148/75 saturations 98%  Ventilator Settings mode ventilation assist control FiO2 30% tidal volume 500 PEEP 9  . General: Comfortable at this time . Eyes: Grossly normal lids, irises & conjunctiva . ENT: grossly tongue is normal . Neck: no obvious mass . Cardiovascular: S1 S2 normal no gallop . Respiratory: No rhonchi no rales are noted at this time . Abdomen: soft . Skin: no rash seen on limited exam . Musculoskeletal: not rigid . Psychiatric:unable to assess . Neurologic: no seizure no involuntary movements         Lab Data:   Basic Metabolic Panel: Recent Labs  Lab 12/30/18 0814 01/01/19 0822 01/03/19 0946 01/04/19 0500  NA 138 139 139  --   K 4.1 4.1 3.4* 4.1  CL 97* 96* 90*  --   CO2 34* 34* 40*  --   GLUCOSE 128* 134* 142*  --   BUN 28* 27* 22*  --   CREATININE 0.46* 0.39* 0.31*  --   CALCIUM 8.9 8.9 9.0  --   MG 2.1 2.0 1.8  --     ABG: No results for input(s): PHART, PCO2ART, PO2ART, HCO3, O2SAT in the last 168 hours.  Liver Function Tests: Recent Labs  Lab 12/30/18 0814  AST 38  ALT 58*  ALKPHOS 70  BILITOT 0.7  PROT 5.1*  ALBUMIN 1.5*   No results for input(s): LIPASE, AMYLASE in the last 168 hours. No results for input(s): AMMONIA in the last 168 hours.  CBC: Recent Labs  Lab 12/29/18 0418 12/30/18 0814  01/01/19 0822 01/03/19 0946 01/04/19 0500  WBC 8.2 8.4 9.7 8.9 9.4  NEUTROABS  --   --   --   --  7.1  HGB 8.0* 7.6* 7.5* 7.6* 7.9*  HCT 25.4* 24.7* 25.3* 25.3* 25.8*  MCV 93.7 95.7 97.7 95.5 95.2  PLT 206 222 332 370 465*    Cardiac Enzymes: No results for input(s): CKTOTAL, CKMB, CKMBINDEX, TROPONINI in the last 168 hours.  BNP (last 3 results) No results for input(s): BNP in the last 8760 hours.  ProBNP (last 3 results) No results for input(s): PROBNP in the last 8760 hours.  Radiological Exams: Dg Chest Port 1 View  Result Date: 01/04/2019 CLINICAL DATA:  Pneumonia. EXAM: PORTABLE CHEST 1 VIEW COMPARISON:  January 02, 2019. FINDINGS: Stable cardiomediastinal silhouette. Right-sided PICC line is unchanged. No pneumothorax is noted. Left lung is clear. Mild right basilar opacity is noted concerning for atelectasis or infiltrate. Bony thorax is unremarkable. IMPRESSION: Mild right basilar atelectasis or infiltrate. Electronically Signed   By: Lupita Raider M.D.   On: 01/04/2019 07:09    Assessment/Plan Active Problems:   Acute on chronic respiratory failure with hypoxia (HCC)   Lobar pneumonia, unspecified organism (HCC)   Chronic congestive heart failure (HCC)   Closed C7 fracture without spinal cord injury, sequela  Pulmonary embolism and infarction ([place])   1. Acute on chronic respiratory failure with hypoxia we will continue with assist control mode on 30% FiO2 respiratory therapy will check the RSB if it is feasible ventilator will be weaned on pressure support 2. Lobar pneumonia still having fevers there is some basilar atelectasis noted on the chest film no pneumothoraces noted 3. Chronic congestive heart failure at baseline we will continue with supportive care 4. C7 fracture no changes 5. Pulmonary embolism patient has been treated   I have personally seen and evaluated the patient, evaluated laboratory and imaging results, formulated the assessment and plan  and placed orders. The Patient requires high complexity decision making for assessment and support.  Case was discussed on Rounds with the Respiratory Therapy Staff  Allyne Gee, MD [facility] Pulmonary Critical Care Medicine Sleep Medicine

## 2019-01-05 DIAGNOSIS — S12690S Other displaced fracture of seventh cervical vertebra, sequela: Secondary | ICD-10-CM | POA: Diagnosis not present

## 2019-01-05 DIAGNOSIS — J9621 Acute and chronic respiratory failure with hypoxia: Secondary | ICD-10-CM | POA: Diagnosis not present

## 2019-01-05 DIAGNOSIS — I509 Heart failure, unspecified: Secondary | ICD-10-CM | POA: Diagnosis not present

## 2019-01-05 DIAGNOSIS — J181 Lobar pneumonia, unspecified organism: Secondary | ICD-10-CM | POA: Diagnosis not present

## 2019-01-05 LAB — CULTURE, RESPIRATORY W GRAM STAIN

## 2019-01-05 LAB — CBC
HCT: 25.7 % — ABNORMAL LOW (ref 39.0–52.0)
Hemoglobin: 8 g/dL — ABNORMAL LOW (ref 13.0–17.0)
MCH: 28.9 pg (ref 26.0–34.0)
MCHC: 31.1 g/dL (ref 30.0–36.0)
MCV: 92.8 fL (ref 80.0–100.0)
Platelets: 450 10*3/uL — ABNORMAL HIGH (ref 150–400)
RBC: 2.77 MIL/uL — ABNORMAL LOW (ref 4.22–5.81)
RDW: 14.4 % (ref 11.5–15.5)
WBC: 8.2 10*3/uL (ref 4.0–10.5)
nRBC: 0 % (ref 0.0–0.2)

## 2019-01-05 LAB — RENAL FUNCTION PANEL
Albumin: 1.6 g/dL — ABNORMAL LOW (ref 3.5–5.0)
Anion gap: 8 (ref 5–15)
BUN: 20 mg/dL (ref 6–20)
CO2: 39 mmol/L — ABNORMAL HIGH (ref 22–32)
Calcium: 9.2 mg/dL (ref 8.9–10.3)
Chloride: 89 mmol/L — ABNORMAL LOW (ref 98–111)
Creatinine, Ser: 0.35 mg/dL — ABNORMAL LOW (ref 0.61–1.24)
GFR calc Af Amer: >60 mL/min (ref >60)
GFR calc non Af Amer: >60 mL/min (ref >60)
Glucose, Bld: 133 mg/dL — ABNORMAL HIGH (ref 70–99)
Phosphorus: 3 mg/dL (ref 2.5–4.6)
Potassium: 3.7 mmol/L (ref 3.5–5.1)
Sodium: 136 mmol/L (ref 135–145)

## 2019-01-05 LAB — MAGNESIUM: Magnesium: 2 mg/dL (ref 1.7–2.4)

## 2019-01-05 NOTE — Progress Notes (Signed)
Pulmonary Critical Care Medicine [place]   PULMONARY CRITICAL CARE SERVICE  PROGRESS NOTE  Date of Service: 01/05/2019  Raymond Avila  WUJ:[id number]  DOB: November 27, 1965   DOA: 12/23/2018  Referring Physician: Merton Border, MD  Raymond Avila is a 53 y.o. male seen for follow up of Acute on Chronic Respiratory Failure.  Patient right now is weaning right now on pressure support has been on 30% FiO2 the goal today is for up to 12 hours  Medications: Reviewed on Rounds  Physical Exam:  Vitals: Temperature 98.9 pulse 87 respiratory 18 blood pressure 124/70 saturations 100%  Ventilator Settings mode ventilation pressure support FiO2 30% pressure support 12/5  . General: Comfortable at this time . Eyes: Grossly normal lids, irises & conjunctiva . ENT: grossly tongue is normal . Neck: no obvious mass . Cardiovascular: S1 S2 normal no gallop . Respiratory: No rhonchi coarse breath sounds . Abdomen: soft . Skin: no rash seen on limited exam . Musculoskeletal: not rigid . Psychiatric:unable to assess . Neurologic: no seizure no involuntary movements         Lab Data:   Basic Metabolic Panel: Recent Labs  Lab 12/30/18 0814 01/01/19 0822 01/03/19 0946 01/04/19 0500 01/05/19 0920  NA 138 139 139  --  136  K 4.1 4.1 3.4* 4.1 3.7  CL 97* 96* 90*  --  89*  CO2 34* 34* 40*  --  39*  GLUCOSE 128* 134* 142*  --  133*  BUN 28* 27* 22*  --  20  CREATININE 0.46* 0.39* 0.31*  --  0.35*  CALCIUM 8.9 8.9 9.0  --  9.2  MG 2.1 2.0 1.8  --  2.0  PHOS  --   --   --   --  3.0    ABG: No results for input(s): PHART, PCO2ART, PO2ART, HCO3, O2SAT in the last 168 hours.  Liver Function Tests: Recent Labs  Lab 12/30/18 0814 01/05/19 0920  AST 38  --   ALT 58*  --   ALKPHOS 70  --   BILITOT 0.7  --   PROT 5.1*  --   ALBUMIN 1.5* 1.6*   No results for input(s): LIPASE, AMYLASE in the last 168 hours. No results for input(s): AMMONIA in the last 168  hours.  CBC: Recent Labs  Lab 12/30/18 0814 01/01/19 0822 01/03/19 0946 01/04/19 0500 01/05/19 0920  WBC 8.4 9.7 8.9 9.4 8.2  NEUTROABS  --   --   --  7.1  --   HGB 7.6* 7.5* 7.6* 7.9* 8.0*  HCT 24.7* 25.3* 25.3* 25.8* 25.7*  MCV 95.7 97.7 95.5 95.2 92.8  PLT 222 332 370 465* 450*    Cardiac Enzymes: No results for input(s): CKTOTAL, CKMB, CKMBINDEX, TROPONINI in the last 168 hours.  BNP (last 3 results) No results for input(s): BNP in the last 8760 hours.  ProBNP (last 3 results) No results for input(s): PROBNP in the last 8760 hours.  Radiological Exams: Dg Chest Port 1 View  Result Date: 01/04/2019 CLINICAL DATA:  Pneumonia. EXAM: PORTABLE CHEST 1 VIEW COMPARISON:  January 02, 2019. FINDINGS: Stable cardiomediastinal silhouette. Right-sided PICC line is unchanged. No pneumothorax is noted. Left lung is clear. Mild right basilar opacity is noted concerning for atelectasis or infiltrate. Bony thorax is unremarkable. IMPRESSION: Mild right basilar atelectasis or infiltrate. Electronically Signed   By: Marijo Conception M.D.   On: 01/04/2019 07:09    Assessment/Plan Active Problems:   Acute on  chronic respiratory failure with hypoxia (HCC)   Lobar pneumonia, unspecified organism (HCC)   Chronic congestive heart failure (HCC)   Closed C7 fracture without spinal cord injury, sequela   Pulmonary embolism and infarction (HCC)   1. Acute on chronic respiratory failure hypoxia plan is to continue to wean on pressure support mode the goal is 12 hours 2. Lobar pneumonia treated we will continue to follow 3. Chronic congestive heart failure right now appears to be compensated 4. C7 fracture supportive care 5. Pulmonary embolism treated   I have personally seen and evaluated the patient, evaluated laboratory and imaging results, formulated the assessment and plan and placed orders. The Patient requires high complexity decision making for assessment and support.  Case was  discussed on Rounds with the Respiratory Therapy Staff  Yevonne Pax, MD [facility] Pulmonary Critical Care Medicine Sleep Medicine

## 2019-01-06 DIAGNOSIS — J181 Lobar pneumonia, unspecified organism: Secondary | ICD-10-CM | POA: Diagnosis not present

## 2019-01-06 DIAGNOSIS — J9621 Acute and chronic respiratory failure with hypoxia: Secondary | ICD-10-CM | POA: Diagnosis not present

## 2019-01-06 DIAGNOSIS — I509 Heart failure, unspecified: Secondary | ICD-10-CM | POA: Diagnosis not present

## 2019-01-06 DIAGNOSIS — S12690S Other displaced fracture of seventh cervical vertebra, sequela: Secondary | ICD-10-CM | POA: Diagnosis not present

## 2019-01-06 LAB — VALPROIC ACID LEVEL: Valproic Acid Lvl: 23 ug/mL — ABNORMAL LOW (ref 50.0–100.0)

## 2019-01-06 NOTE — Progress Notes (Signed)
Pulmonary Critical Care Medicine [place]   PULMONARY CRITICAL CARE SERVICE  PROGRESS NOTE  Date of Service: 01/06/2019  Raymond Avila  EXH:[id number]  DOB: 05-10-1965   DOA: 12/23/2018  Referring Physician: Merton Border, MD  HPI: Raymond Avila is a 53 y.o. male seen for follow up of Acute on Chronic Respiratory Failure.  Patient has been weaning currently is on pressure support 12/5 the goal today is for 12 hours  Medications: Reviewed on Rounds  Physical Exam:  Vitals: Temperature 98.2 pulse 86 respiratory rate 17 blood pressure 113/55 saturations 98%  Ventilator Settings mode ventilation pressure support FiO2 30% pressure poor 12 PEEP 5  . General: Comfortable at this time . Eyes: Grossly normal lids, irises & conjunctiva . ENT: grossly tongue is normal . Neck: no obvious mass . Cardiovascular: S1 S2 normal no gallop . Respiratory: No rhonchi no rales are noted . Abdomen: soft . Skin: no rash seen on limited exam . Musculoskeletal: not rigid . Psychiatric:unable to assess . Neurologic: no seizure no involuntary movements         Lab Data:   Basic Metabolic Panel: Recent Labs  Lab 01/01/19 0822 01/03/19 0946 01/04/19 0500 01/05/19 0920  NA 139 139  --  136  K 4.1 3.4* 4.1 3.7  CL 96* 90*  --  89*  CO2 34* 40*  --  39*  GLUCOSE 134* 142*  --  133*  BUN 27* 22*  --  20  CREATININE 0.39* 0.31*  --  0.35*  CALCIUM 8.9 9.0  --  9.2  MG 2.0 1.8  --  2.0  PHOS  --   --   --  3.0    ABG: No results for input(s): PHART, PCO2ART, PO2ART, HCO3, O2SAT in the last 168 hours.  Liver Function Tests: Recent Labs  Lab 01/05/19 0920  ALBUMIN 1.6*   No results for input(s): LIPASE, AMYLASE in the last 168 hours. No results for input(s): AMMONIA in the last 168 hours.  CBC: Recent Labs  Lab 01/01/19 0822 01/03/19 0946 01/04/19 0500 01/05/19 0920  WBC 9.7 8.9 9.4 8.2  NEUTROABS  --   --  7.1  --   HGB 7.5* 7.6* 7.9* 8.0*  HCT 25.3*  25.3* 25.8* 25.7*  MCV 97.7 95.5 95.2 92.8  PLT 332 370 465* 450*    Cardiac Enzymes: No results for input(s): CKTOTAL, CKMB, CKMBINDEX, TROPONINI in the last 168 hours.  BNP (last 3 results) No results for input(s): BNP in the last 8760 hours.  ProBNP (last 3 results) No results for input(s): PROBNP in the last 8760 hours.  Radiological Exams: No results found.  Assessment/Plan Active Problems:   Acute on chronic respiratory failure with hypoxia (HCC)   Lobar pneumonia, unspecified organism (HCC)   Chronic congestive heart failure (HCC)   Closed C7 fracture without spinal cord injury, sequela   Pulmonary embolism and infarction ([place])   1. Acute on chronic respiratory failure hypoxia continue with pressure support mode will titrate oxygen as tolerated as already mentioned the goal is 12 hours 2. Lobar pneumonia treated slowly improving 3. Chronic congestive heart failure monitor fluid status 4. Pulmonary embolism treated 5. C7 fracture at baseline   I have personally seen and evaluated the patient, evaluated laboratory and imaging results, formulated the assessment and plan and placed orders. The Patient requires high complexity decision making for assessment and support.  Case was discussed on Rounds with the Respiratory Therapy Staff  Allyne Gee,  MD [facility] Pulmonary Critical Care Medicine Sleep Medicine

## 2019-01-07 DIAGNOSIS — I509 Heart failure, unspecified: Secondary | ICD-10-CM | POA: Diagnosis not present

## 2019-01-07 DIAGNOSIS — J181 Lobar pneumonia, unspecified organism: Secondary | ICD-10-CM | POA: Diagnosis not present

## 2019-01-07 DIAGNOSIS — S12690S Other displaced fracture of seventh cervical vertebra, sequela: Secondary | ICD-10-CM | POA: Diagnosis not present

## 2019-01-07 DIAGNOSIS — J9621 Acute and chronic respiratory failure with hypoxia: Secondary | ICD-10-CM | POA: Diagnosis not present

## 2019-01-07 LAB — MAGNESIUM: Magnesium: 2 mg/dL (ref 1.7–2.4)

## 2019-01-07 LAB — BASIC METABOLIC PANEL
Anion gap: 11 (ref 5–15)
BUN: 17 mg/dL (ref 6–20)
CO2: 34 mmol/L — ABNORMAL HIGH (ref 22–32)
Calcium: 8.9 mg/dL (ref 8.9–10.3)
Chloride: 86 mmol/L — ABNORMAL LOW (ref 98–111)
Creatinine, Ser: <0.3 mg/dL — ABNORMAL LOW (ref 0.61–1.24)
Glucose, Bld: 126 mg/dL — ABNORMAL HIGH (ref 70–99)
Potassium: 3.7 mmol/L (ref 3.5–5.1)
Sodium: 131 mmol/L — ABNORMAL LOW (ref 135–145)

## 2019-01-07 LAB — CBC
HCT: 26.9 % — ABNORMAL LOW (ref 39.0–52.0)
Hemoglobin: 8.3 g/dL — ABNORMAL LOW (ref 13.0–17.0)
MCH: 28 pg (ref 26.0–34.0)
MCHC: 30.9 g/dL (ref 30.0–36.0)
MCV: 90.9 fL (ref 80.0–100.0)
Platelets: 514 10*3/uL — ABNORMAL HIGH (ref 150–400)
RBC: 2.96 MIL/uL — ABNORMAL LOW (ref 4.22–5.81)
RDW: 14.4 % (ref 11.5–15.5)
WBC: 10 10*3/uL (ref 4.0–10.5)
nRBC: 0 % (ref 0.0–0.2)

## 2019-01-07 LAB — PHOSPHORUS: Phosphorus: 2.9 mg/dL (ref 2.5–4.6)

## 2019-01-07 NOTE — Progress Notes (Signed)
Pulmonary Critical Care Medicine [place]   PULMONARY CRITICAL CARE SERVICE  PROGRESS NOTE  Date of Service: 01/07/2019  Raymond Avila  ENI:[id number]  DOB: 20-May-1965   DOA: 12/23/2018  Referring Physician: Merton Border, MD  HPI: Raymond Avila is a 53 y.o. male seen for follow up of Acute on Chronic Respiratory Failure.  Patient is on pressure support mode currently is on 35% FiO2 good volumes are noted  Medications: Reviewed on Rounds  Physical Exam:  Vitals: Temperature is 98.5 pulse 94 respiratory 15 blood pressure 100/44 saturations 97%  Ventilator Settings mode ventilation pressure support FiO2 35% tidal line 600 pressure poor 12 PEEP 5  . General: Comfortable at this time . Eyes: Grossly normal lids, irises & conjunctiva . ENT: grossly tongue is normal . Neck: no obvious mass . Cardiovascular: S1 S2 normal no gallop . Respiratory: No rhonchi or rales are noted at this time . Abdomen: soft . Skin: no rash seen on limited exam . Musculoskeletal: not rigid . Psychiatric:unable to assess . Neurologic: no seizure no involuntary movements         Lab Data:   Basic Metabolic Panel: Recent Labs  Lab 01/01/19 0822 01/03/19 0946 01/04/19 0500 01/05/19 0920 01/07/19 0417  NA 139 139  --  136 131*  K 4.1 3.4* 4.1 3.7 3.7  CL 96* 90*  --  89* 86*  CO2 34* 40*  --  39* 34*  GLUCOSE 134* 142*  --  133* 126*  BUN 27* 22*  --  20 17  CREATININE 0.39* 0.31*  --  0.35* <0.30*  CALCIUM 8.9 9.0  --  9.2 8.9  MG 2.0 1.8  --  2.0 2.0  PHOS  --   --   --  3.0 2.9    ABG: No results for input(s): PHART, PCO2ART, PO2ART, HCO3, O2SAT in the last 168 hours.  Liver Function Tests: Recent Labs  Lab 01/05/19 0920  ALBUMIN 1.6*   No results for input(s): LIPASE, AMYLASE in the last 168 hours. No results for input(s): AMMONIA in the last 168 hours.  CBC: Recent Labs  Lab 01/01/19 0822 01/03/19 0946 01/04/19 0500 01/05/19 0920 01/07/19 0417   WBC 9.7 8.9 9.4 8.2 10.0  NEUTROABS  --   --  7.1  --   --   HGB 7.5* 7.6* 7.9* 8.0* 8.3*  HCT 25.3* 25.3* 25.8* 25.7* 26.9*  MCV 97.7 95.5 95.2 92.8 90.9  PLT 332 370 465* 450* 514*    Cardiac Enzymes: No results for input(s): CKTOTAL, CKMB, CKMBINDEX, TROPONINI in the last 168 hours.  BNP (last 3 results) No results for input(s): BNP in the last 8760 hours.  ProBNP (last 3 results) No results for input(s): PROBNP in the last 8760 hours.  Radiological Exams: No results found.  Assessment/Plan Active Problems:   Acute on chronic respiratory failure with hypoxia (HCC)   Lobar pneumonia, unspecified organism (HCC)   Chronic congestive heart failure (HCC)   Closed C7 fracture without spinal cord injury, sequela   Pulmonary embolism and infarction ([place])   1. Acute on chronic respiratory failure with hypoxia patient is weaning on pressure support today the goal is for 16 hours however it appears that he does better on pressure support mode then he does going back to his resting settings so we will continue with pressure support as tolerated 2. Lobar pneumonia treated clinically improving 3. Chronic congestive heart failure monitor fluid status diurese as tolerated 4. C7 fracture at  baseline we will continue with supportive care 5. Pulmonary embolism treated we will continue to follow along   I have personally seen and evaluated the patient, evaluated laboratory and imaging results, formulated the assessment and plan and placed orders. The Patient requires high complexity decision making for assessment and support.  Case was discussed on Rounds with the Respiratory Therapy Staff  Yevonne Pax, MD [facility] Pulmonary Critical Care Medicine Sleep Medicine

## 2019-01-08 DIAGNOSIS — I509 Heart failure, unspecified: Secondary | ICD-10-CM | POA: Diagnosis not present

## 2019-01-08 DIAGNOSIS — J181 Lobar pneumonia, unspecified organism: Secondary | ICD-10-CM | POA: Diagnosis not present

## 2019-01-08 DIAGNOSIS — S12690S Other displaced fracture of seventh cervical vertebra, sequela: Secondary | ICD-10-CM | POA: Diagnosis not present

## 2019-01-08 DIAGNOSIS — J9621 Acute and chronic respiratory failure with hypoxia: Secondary | ICD-10-CM | POA: Diagnosis not present

## 2019-01-08 NOTE — Progress Notes (Signed)
PROGRESS NOTE    Raymond Avila  DGU:[id number] DOB: 10-31-1965 DOA: 12/23/2018   Brief Narrative:  Raymond Avila is an 53 y.o. 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  He was admitted to outside hospital on 11/23/2018.  He required anterior cervical disc fusion and despite this developed paraplegia.  Postoperatively patient was intubated and had respiratory complications after that.  He developed pneumonia secondary to Klebsiella.  He had prolonged ventilation and eventually underwent tracheostomy placement.  Patient also had DVT in right lower extremity and left upper extremity which led to pulmonary embolism.  He was initially on heparin drip and then transitioned to Eliquis.  He also had a feeding tube placed.  Apparently posterior surgical fusion was postponed secondary to the patient's medical condition and inability to stay prone for the surgery because of the respiratory failure.  Due to his medical problems he was transferred to [facility].  After admission here patient has been having fever spikes.  All cultures to date negative.  He was treated with IV vancomycin, meropenem, fluconazole.  However, continued to have fevers despite being on antibiotics.  Therefore antibiotics have been discontinued.  Thought to be neuroleptic malignant syndrome?  Due to psych medications.  His psych medications were adjusted. Currently he has been afebrile for the past 2 days.   Assessment & Plan:   Active Problems:   Acute on chronic respiratory failure with hypoxia (HCC)   Lobar pneumonia, unspecified organism (HCC)   Chronic congestive heart failure (HCC)   Closed C7 fracture with paraplegia   Pulmonary embolism and infarction  (HCC) Right lower extremity/left upper extremity DVT Fevers COPD Obstructive sleep apnea Morbid obesity Protein calorie malnutrition Dysphagia History of schizoaffective disorder  Acute on chronic hypoxemic respiratory failure: Multifactorial. Pneumonia suspected on chest x-ray, also has obstructive sleep apnea obesity hypoventilation syndrome, congestive heart failure, also had recent DVT and PE.  Patient last night had increased work of breathing and was on 100% FiO2.  Now on 35%  FiO2. Recommended CT of the chest abdomen and pelvis given the ongoing fevers and respiratory status.  However, patient could not tolerate the CT therefore unable to get imaging.  Chest x-ray on 01/01/2019 showing mild right basilar atelectasis or infiltrate.  He already received treatment with IV vancomycin, meropenem, fluconazole.  He continued to have fevers despite being on antibiotics.  Currently off antibiotics. Previous Tracheal aspirate cultures showing rare gram-positive rods, normal respiratory flora.  There is also concern for aspiration.  If he has any further high fever spikes with temperature greater than 101 would recommend to send for pan cultures again.  Pneumonia: Already treated with IV vancomycin, ertapenem, fluconazole.  Improving.  Pulmonary following. If his respiratory status worsens, would recommend to attempt CT imaging for better evaluation. Previously CT attempt had to be aborted because the patient could not tolerate.  Pulmonary following.  Fever: Patient was having persistent fever since his admission here despite being on antibiotics.  Respiratory cultures showing normal flora.    There is also concern for aspiration.  Fevers were thought to be either secondary to DVT/PE versus neurolept malignant syndrome.  His psychiatric medications were adjusted.  Has been afebrile for the past 2 days.  However, he is at high risk for recurrent fevers.  If he has a fever spike greater than 101 would  recommend to send for pan cultures again.  Right lower extremity DVT/left upper extremity DVT/pulmonary embolism: He was on IV heparin at the outside facility now transitioned to Eliquis. Further management per the primary team.  C7 fracture status post ACDF surgery: Surgical site stable.  Unfortunately has paraplegia with weakness.  Continue supportive management per the primary team.  COPD: Continue medication and management per primary team and pulmonary.  Morbid obesity/obstructive sleep apnea: Patient admits to not using the CPAP.  Continue management per primary team and pulmonary.  Protein calorie malnutrition: Continue management per primary team.  Dysphagia: Unfortunately due to his dysphagia he is high risk for aspiration and worsening respiratory failure secondary to aspiration pneumonia.  Schizoaffective disorder: Medication and management per the primary team.   Due to his complex medical problems he is very high risk for worsening and decompensation.   Subjective: Afebrile for the past 2 days.  Off all antibiotics.  Denies having any major complaints at this time.  Objective: Temperature 99, pulse 94, respiratory rate 20, blood pressure 136/66, pulse oximetry 99%  Examination:  General exam: Morbidly obese male, awake, not in any acute distress, 35% FiO2  HEENT: Atraumatic, normocephalic, pupils equal and reactive, ear or nose lesions. Neck: Trach in place, has c-collar in place Respiratory system: Occasional rhonchi, no wheezing anteriorly Cardiovascular system: S1 & S2 heard, no murmur. Gastrointestinal system: Obese male, soft and nontender. Normal bowel sounds heard. Central nervous system: Weakness in all extremities Extremities: Edema lower extremity and right upper extremity Skin: No rashes Psychiatry: Mood & affect appropriate.     Data Reviewed: I have personally reviewed following labs and imaging studies  CBC: Recent Labs  Lab  01/03/19 0946 01/04/19 0500 01/05/19 0920 01/07/19 0417  WBC 8.9 9.4 8.2 10.0  NEUTROABS  --  7.1  --   --   HGB 7.6* 7.9* 8.0* 8.3*  HCT 25.3* 25.8* 25.7* 26.9*  MCV 95.5 95.2 92.8 90.9  PLT 370 465* 450* 514*   Basic Metabolic Panel: Recent Labs  Lab 01/03/19 0946 01/04/19 0500 01/05/19 0920 01/07/19 0417  NA 139  --  136 131*  K 3.4* 4.1 3.7 3.7  CL 90*  --  89* 86*  CO2 40*  --  39* 34*  GLUCOSE 142*  --  133* 126*  BUN 22*  --  20 17  CREATININE 0.31*  --  0.35* <0.30*  CALCIUM 9.0  --  9.2 8.9  MG 1.8  --  2.0 2.0  PHOS  --   --  3.0 2.9   GFR: CrCl cannot be calculated (This lab value cannot be used to calculate CrCl because it is not a number: <0.30). Liver Function Tests: Recent Labs  Lab 01/05/19 0920  ALBUMIN 1.6*   No results for input(s): LIPASE, AMYLASE in the last 168 hours. No results for input(s): AMMONIA in the last 168 hours. Coagulation Profile: No results for input(s): INR, PROTIME in the last 168 hours. Cardiac Enzymes: No results for input(s): CKTOTAL, CKMB, CKMBINDEX, TROPONINI in the last 168 hours. BNP (last 3 results) No results for input(s): PROBNP in the last 8760 hours. HbA1C: No results for input(s): HGBA1C in the last 72 hours. CBG: No results for input(s): GLUCAP in the last 168 hours. Lipid Profile: No results for input(s): CHOL, HDL, LDLCALC, TRIG, CHOLHDL, LDLDIRECT in the last 72 hours. Thyroid Function Tests: No results for input(s): TSH, T4TOTAL, FREET4, T3FREE, THYROIDAB  in the last 72 hours. Anemia Panel: No results for input(s): VITAMINB12, FOLATE, FERRITIN, TIBC, IRON, RETICCTPCT in the last 72 hours. Sepsis Labs: No results for input(s): PROCALCITON, LATICACIDVEN in the last 168 hours.  Recent Results (from the past 240 hour(s))  C difficile quick scan w PCR reflex     Status: None   Collection Time: 12/30/18  5:22 AM   Specimen: STOOL  Result Value Ref Range Status   C Diff antigen NEGATIVE NEGATIVE Final    C Diff toxin NEGATIVE NEGATIVE Final   C Diff interpretation No C. difficile detected.  Final    Comment: Performed at [facility], [address] [place], [place] [address]  Culture, respiratory     Status: None   Collection Time: 01/02/19  1:59 PM   Specimen: Tracheal Aspirate; Respiratory  Result Value Ref Range Status   Specimen Description TRACHEAL ASPIRATE  Final   Special Requests NONE  Final   Gram Stain   Final    FEW WBC PRESENT, PREDOMINANTLY PMN NO ORGANISMS SEEN    Culture   Final    FEW SPHINGOBACTERIUM SPECIES Standardized susceptibility testing for this organism is not available. Performed at [facility], [address], [place], [place] [address]    Report Status 01/05/2019 FINAL  Final         Radiology Studies: No results found.      Scheduled Meds: Please see MAR   Vonzella Nipple, MD 01/08/2019, 1:41 PM

## 2019-01-08 NOTE — Progress Notes (Signed)
Pulmonary Critical Care Medicine [place]   PULMONARY CRITICAL CARE SERVICE  PROGRESS NOTE  Date of Service: 01/08/2019  Raymond Avila  TIR:[id number]  DOB: 1965-11-19   DOA: 12/23/2018  Referring Physician: Merton Border, MD  HPI: Raymond Avila is a 53 y.o. male seen for follow up of Acute on Chronic Respiratory Failure.  Patient is weaning again has been on pressure support overnight looks good right now is on 35% FiO2  Medications: Reviewed on Rounds  Physical Exam:  Vitals: Temperature 97.8 pulse 91 respiratory rate 17 blood pressure 113/62 saturations 99%  Ventilator Settings mode of ventilation pressure support FiO2 35% tidal line 474 pressure poor 12 PEEP 5  . General: Comfortable at this time . Eyes: Grossly normal lids, irises & conjunctiva . ENT: grossly tongue is normal . Neck: no obvious mass . Cardiovascular: S1 S2 normal no gallop . Respiratory: No rhonchi coarse breath sounds noted . Abdomen: soft . Skin: no rash seen on limited exam . Musculoskeletal: not rigid . Psychiatric:unable to assess . Neurologic: no seizure no involuntary movements         Lab Data:   Basic Metabolic Panel: Recent Labs  Lab 01/03/19 0946 01/04/19 0500 01/05/19 0920 01/07/19 0417  NA 139  --  136 131*  K 3.4* 4.1 3.7 3.7  CL 90*  --  89* 86*  CO2 40*  --  39* 34*  GLUCOSE 142*  --  133* 126*  BUN 22*  --  20 17  CREATININE 0.31*  --  0.35* <0.30*  CALCIUM 9.0  --  9.2 8.9  MG 1.8  --  2.0 2.0  PHOS  --   --  3.0 2.9    ABG: No results for input(s): PHART, PCO2ART, PO2ART, HCO3, O2SAT in the last 168 hours.  Liver Function Tests: Recent Labs  Lab 01/05/19 0920  ALBUMIN 1.6*   No results for input(s): LIPASE, AMYLASE in the last 168 hours. No results for input(s): AMMONIA in the last 168 hours.  CBC: Recent Labs  Lab 01/03/19 0946 01/04/19 0500 01/05/19 0920 01/07/19 0417  WBC 8.9 9.4 8.2 10.0  NEUTROABS  --  7.1  --   --   HGB  7.6* 7.9* 8.0* 8.3*  HCT 25.3* 25.8* 25.7* 26.9*  MCV 95.5 95.2 92.8 90.9  PLT 370 465* 450* 514*    Cardiac Enzymes: No results for input(s): CKTOTAL, CKMB, CKMBINDEX, TROPONINI in the last 168 hours.  BNP (last 3 results) No results for input(s): BNP in the last 8760 hours.  ProBNP (last 3 results) No results for input(s): PROBNP in the last 8760 hours.  Radiological Exams: No results found.  Assessment/Plan Active Problems:   Acute on chronic respiratory failure with hypoxia (HCC)   Lobar pneumonia, unspecified organism (HCC)   Chronic congestive heart failure (HCC)   Closed C7 fracture without spinal cord injury, sequela   Pulmonary embolism and infarction ([place])   1. Acute on chronic respiratory failure hypoxia plan is to continue with the wean on pressure support respiratory failure.  Continue to follow protocol try T collar if able to 2. Lobar pneumonia treated clinically appears to be improving 3. Chronic congestive heart failure at baseline we will continue with supportive care 4. C7 fracture we will continue with supportive care 5. Pulmonary embolism treated   I have personally seen and evaluated the patient, evaluated laboratory and imaging results, formulated the assessment and plan and placed orders. The Patient requires high complexity decision  making for assessment and support.  Case was discussed on Rounds with the Respiratory Therapy Staff  Allyne Gee, MD [facility] Pulmonary Critical Care Medicine Sleep Medicine

## 2019-01-09 DIAGNOSIS — J9621 Acute and chronic respiratory failure with hypoxia: Secondary | ICD-10-CM | POA: Diagnosis not present

## 2019-01-09 DIAGNOSIS — J181 Lobar pneumonia, unspecified organism: Secondary | ICD-10-CM | POA: Diagnosis not present

## 2019-01-09 DIAGNOSIS — S12690S Other displaced fracture of seventh cervical vertebra, sequela: Secondary | ICD-10-CM | POA: Diagnosis not present

## 2019-01-09 DIAGNOSIS — I509 Heart failure, unspecified: Secondary | ICD-10-CM | POA: Diagnosis not present

## 2019-01-09 NOTE — Progress Notes (Signed)
Pulmonary Critical Care Medicine [place]   PULMONARY CRITICAL CARE SERVICE  PROGRESS NOTE  Date of Service: 01/09/2019  Raymond Avila  ZOX:[id number]  DOB: 1965-03-25   DOA: 12/23/2018  Referring Physician: Merton Border, MD  HPI: Raymond Avila is a 53 y.o. male seen for follow up of Acute on Chronic Respiratory Failure.  Patient is weaning currently is on T collar has been on 35% FiO2 with a goal of 6 hours  Medications: Reviewed on Rounds  Physical Exam:  Vitals: Temperature 100.4 pulse 88 respiratory 24 blood pressure 113/59 saturations 97%  Ventilator Settings off the ventilator on T collar  . General: Comfortable at this time . Eyes: Grossly normal lids, irises & conjunctiva . ENT: grossly tongue is normal . Neck: no obvious mass . Cardiovascular: S1 S2 normal no gallop . Respiratory: No rhonchi no rales are noted at this time . Abdomen: soft . Skin: no rash seen on limited exam . Musculoskeletal: not rigid . Psychiatric:unable to assess . Neurologic: no seizure no involuntary movements         Lab Data:   Basic Metabolic Panel: Recent Labs  Lab 01/03/19 0946 01/04/19 0500 01/05/19 0920 01/07/19 0417  NA 139  --  136 131*  K 3.4* 4.1 3.7 3.7  CL 90*  --  89* 86*  CO2 40*  --  39* 34*  GLUCOSE 142*  --  133* 126*  BUN 22*  --  20 17  CREATININE 0.31*  --  0.35* <0.30*  CALCIUM 9.0  --  9.2 8.9  MG 1.8  --  2.0 2.0  PHOS  --   --  3.0 2.9    ABG: No results for input(s): PHART, PCO2ART, PO2ART, HCO3, O2SAT in the last 168 hours.  Liver Function Tests: Recent Labs  Lab 01/05/19 0920  ALBUMIN 1.6*   No results for input(s): LIPASE, AMYLASE in the last 168 hours. No results for input(s): AMMONIA in the last 168 hours.  CBC: Recent Labs  Lab 01/03/19 0946 01/04/19 0500 01/05/19 0920 01/07/19 0417  WBC 8.9 9.4 8.2 10.0  NEUTROABS  --  7.1  --   --   HGB 7.6* 7.9* 8.0* 8.3*  HCT 25.3* 25.8* 25.7* 26.9*  MCV 95.5  95.2 92.8 90.9  PLT 370 465* 450* 514*    Cardiac Enzymes: No results for input(s): CKTOTAL, CKMB, CKMBINDEX, TROPONINI in the last 168 hours.  BNP (last 3 results) No results for input(s): BNP in the last 8760 hours.  ProBNP (last 3 results) No results for input(s): PROBNP in the last 8760 hours.  Radiological Exams: No results found.  Assessment/Plan Active Problems:   Acute on chronic respiratory failure with hypoxia (HCC)   Lobar pneumonia, unspecified organism (HCC)   Chronic congestive heart failure (HCC)   Closed C7 fracture without spinal cord injury, sequela   Pulmonary embolism and infarction ([place])   1. Acute on chronic respiratory failure with hypoxia plan is to continue with the wean on T collar's with a goal of 6 hours 2. Lobar pneumonia treated we will continue present management 3. Chronic congestive heart failure appears to be compensated right now 4. C7 fracture at baseline 5. Pulmonary embolism treated   I have personally seen and evaluated the patient, evaluated laboratory and imaging results, formulated the assessment and plan and placed orders. The Patient requires high complexity decision making for assessment and support.  Case was discussed on Rounds with the Respiratory Therapy Staff  [facility]  Richardson Dopp, MD [facility] Pulmonary Critical Care Medicine Sleep Medicine

## 2019-01-10 DIAGNOSIS — I509 Heart failure, unspecified: Secondary | ICD-10-CM | POA: Diagnosis not present

## 2019-01-10 DIAGNOSIS — J181 Lobar pneumonia, unspecified organism: Secondary | ICD-10-CM | POA: Diagnosis not present

## 2019-01-10 DIAGNOSIS — S12690S Other displaced fracture of seventh cervical vertebra, sequela: Secondary | ICD-10-CM | POA: Diagnosis not present

## 2019-01-10 DIAGNOSIS — J9621 Acute and chronic respiratory failure with hypoxia: Secondary | ICD-10-CM | POA: Diagnosis not present

## 2019-01-10 LAB — PHOSPHORUS: Phosphorus: 3.7 mg/dL (ref 2.5–4.6)

## 2019-01-10 LAB — BASIC METABOLIC PANEL
Anion gap: 10 (ref 5–15)
BUN: 24 mg/dL — ABNORMAL HIGH (ref 6–20)
CO2: 31 mmol/L (ref 22–32)
Calcium: 9.2 mg/dL (ref 8.9–10.3)
Chloride: 87 mmol/L — ABNORMAL LOW (ref 98–111)
Creatinine, Ser: 0.33 mg/dL — ABNORMAL LOW (ref 0.61–1.24)
GFR calc Af Amer: >60 mL/min (ref >60)
GFR calc non Af Amer: >60 mL/min (ref >60)
Glucose, Bld: 140 mg/dL — ABNORMAL HIGH (ref 70–99)
Potassium: 4.1 mmol/L (ref 3.5–5.1)
Sodium: 128 mmol/L — ABNORMAL LOW (ref 135–145)

## 2019-01-10 LAB — MAGNESIUM: Magnesium: 2.1 mg/dL (ref 1.7–2.4)

## 2019-01-10 LAB — CBC
HCT: 26.6 % — ABNORMAL LOW (ref 39.0–52.0)
Hemoglobin: 8.6 g/dL — ABNORMAL LOW (ref 13.0–17.0)
MCH: 29.2 pg (ref 26.0–34.0)
MCHC: 32.3 g/dL (ref 30.0–36.0)
MCV: 90.2 fL (ref 80.0–100.0)
Platelets: 475 10*3/uL — ABNORMAL HIGH (ref 150–400)
RBC: 2.95 MIL/uL — ABNORMAL LOW (ref 4.22–5.81)
RDW: 15 % (ref 11.5–15.5)
WBC: 14.4 10*3/uL — ABNORMAL HIGH (ref 4.0–10.5)
nRBC: 0 % (ref 0.0–0.2)

## 2019-01-10 NOTE — Progress Notes (Signed)
Pulmonary Critical Care Medicine [place]   PULMONARY CRITICAL CARE SERVICE  PROGRESS NOTE  Date of Service: 01/10/2019  Raymond Avila  YHC:[id number]  DOB: September 02, 1965   DOA: 12/23/2018  Referring Physician: Merton Border, MD  HPI: Raymond Avila is a 53 y.o. male seen for follow up of Acute on Chronic Respiratory Failure.  Doing well weaning on T collar today the goal is for about 12 hours  Medications: Reviewed on Rounds  Physical Exam:  Vitals: Temperature 99.8 pulse 98 respiratory rate 22 blood pressure is 118/63 saturations 97%  Ventilator Settings off the ventilator now on T collar 35% FiO2  . General: Comfortable at this time . Eyes: Grossly normal lids, irises & conjunctiva . ENT: grossly tongue is normal . Neck: no obvious mass . Cardiovascular: S1 S2 normal no gallop . Respiratory: No rhonchi no rales are noted at this time . Abdomen: soft . Skin: no rash seen on limited exam . Musculoskeletal: not rigid . Psychiatric:unable to assess . Neurologic: no seizure no involuntary movements         Lab Data:   Basic Metabolic Panel: Recent Labs  Lab 01/04/19 0500 01/05/19 0920 01/07/19 0417 01/10/19 0601  NA  --  136 131* 128*  K 4.1 3.7 3.7 4.1  CL  --  89* 86* 87*  CO2  --  39* 34* 31  GLUCOSE  --  133* 126* 140*  BUN  --  20 17 24*  CREATININE  --  0.35* <0.30* 0.33*  CALCIUM  --  9.2 8.9 9.2  MG  --  2.0 2.0 2.1  PHOS  --  3.0 2.9 3.7    ABG: No results for input(s): PHART, PCO2ART, PO2ART, HCO3, O2SAT in the last 168 hours.  Liver Function Tests: Recent Labs  Lab 01/05/19 0920  ALBUMIN 1.6*   No results for input(s): LIPASE, AMYLASE in the last 168 hours. No results for input(s): AMMONIA in the last 168 hours.  CBC: Recent Labs  Lab 01/04/19 0500 01/05/19 0920 01/07/19 0417 01/10/19 0601  WBC 9.4 8.2 10.0 14.4*  NEUTROABS 7.1  --   --   --   HGB 7.9* 8.0* 8.3* 8.6*  HCT 25.8* 25.7* 26.9* 26.6*  MCV 95.2  92.8 90.9 90.2  PLT 465* 450* 514* 475*    Cardiac Enzymes: No results for input(s): CKTOTAL, CKMB, CKMBINDEX, TROPONINI in the last 168 hours.  BNP (last 3 results) No results for input(s): BNP in the last 8760 hours.  ProBNP (last 3 results) No results for input(s): PROBNP in the last 8760 hours.  Radiological Exams: No results found.  Assessment/Plan Active Problems:   Acute on chronic respiratory failure with hypoxia (HCC)   Lobar pneumonia, unspecified organism (HCC)   Chronic congestive heart failure (HCC)   Closed C7 fracture without spinal cord injury, sequela   Pulmonary embolism and infarction ([place])   1. Acute on chronic respiratory failure with hypoxia patient is off the ventilator we will continue to wean on T collar as tolerated 2. Lobar pneumonia treated we will continue with present management 3. Chronic congestive heart failure at baseline 4. C7 fracture no changes we will continue with supportive care 5. Pulmonary embolism treated   I have personally seen and evaluated the patient, evaluated laboratory and imaging results, formulated the assessment and plan and placed orders. The Patient requires high complexity decision making for assessment and support.  Case was discussed on Rounds with the Respiratory Therapy Staff  [facility]  Richardson Dopp, MD [facility] Pulmonary Critical Care Medicine Sleep Medicine

## 2019-01-11 DIAGNOSIS — I509 Heart failure, unspecified: Secondary | ICD-10-CM | POA: Diagnosis not present

## 2019-01-11 DIAGNOSIS — S12690S Other displaced fracture of seventh cervical vertebra, sequela: Secondary | ICD-10-CM | POA: Diagnosis not present

## 2019-01-11 DIAGNOSIS — J9621 Acute and chronic respiratory failure with hypoxia: Secondary | ICD-10-CM | POA: Diagnosis not present

## 2019-01-11 DIAGNOSIS — J181 Lobar pneumonia, unspecified organism: Secondary | ICD-10-CM | POA: Diagnosis not present

## 2019-01-11 NOTE — Progress Notes (Signed)
Pulmonary Critical Care Medicine [place]   PULMONARY CRITICAL CARE SERVICE  PROGRESS NOTE  Date of Service: 01/11/2019  Raymond Avila  [id number]  DOB: [birth year]   DOA: 12/23/2018  Referring Physician: Merton Border, MD  HPI: Raymond Avila is a 53 y.o. male seen for follow up of Acute on Chronic Respiratory Failure.  Patient is on pressure support this morning was able to do 12 hours yesterday off the ventilator he is ready to go again off the ventilator  Medications: Reviewed on Rounds  Physical Exam:  Vitals: Temperature 97.1 pulse 72 respiratory rate is 14 blood pressure 128/71 saturations 100%  Ventilator Settings mode ventilation pressure support FiO2 30% tidal volume 583 pressure support 12 PEEP 5  . General: Comfortable at this time . Eyes: Grossly normal lids, irises & conjunctiva . ENT: grossly tongue is normal . Neck: no obvious mass . Cardiovascular: S1 S2 normal no gallop . Respiratory: No rhonchi no rales are noted at this time . Abdomen: soft . Skin: no rash seen on limited exam . Musculoskeletal: not rigid . Psychiatric:unable to assess . Neurologic: no seizure no involuntary movements         Lab Data:   Basic Metabolic Panel: Recent Labs  Lab 01/05/19 0920 01/07/19 0417 01/10/19 0601  NA 136 131* 128*  K 3.7 3.7 4.1  CL 89* 86* 87*  CO2 39* 34* 31  GLUCOSE 133* 126* 140*  BUN 20 17 24*  CREATININE 0.35* <0.30* 0.33*  CALCIUM 9.2 8.9 9.2  MG 2.0 2.0 2.1  PHOS 3.0 2.9 3.7    ABG: No results for input(s): PHART, PCO2ART, PO2ART, HCO3, O2SAT in the last 168 hours.  Liver Function Tests: Recent Labs  Lab 01/05/19 0920  ALBUMIN 1.6*   No results for input(s): LIPASE, AMYLASE in the last 168 hours. No results for input(s): AMMONIA in the last 168 hours.  CBC: Recent Labs  Lab 01/05/19 0920 01/07/19 0417 01/10/19 0601  WBC 8.2 10.0 14.4*  HGB 8.0* 8.3* 8.6*  HCT 25.7* 26.9* 26.6*  MCV 92.8 90.9 90.2   PLT 450* 514* 475*    Cardiac Enzymes: No results for input(s): CKTOTAL, CKMB, CKMBINDEX, TROPONINI in the last 168 hours.  BNP (last 3 results) No results for input(s): BNP in the last 8760 hours.  ProBNP (last 3 results) No results for input(s): PROBNP in the last 8760 hours.  Radiological Exams: No results found.  Assessment/Plan Active Problems:   Acute on chronic respiratory failure with hypoxia (HCC)   Lobar pneumonia, unspecified organism (HCC)   Chronic congestive heart failure (HCC)   Closed C7 fracture without spinal cord injury, sequela   Pulmonary embolism and infarction ([place])   1. Acute on chronic respiratory failure hypoxia plan is to continue to wean on T collar was able to complete 12 hours yesterday we will extend out to as tolerated today 2. Lobar pneumonia treated clinically improving 3. Chronic heart failure compensated at this time 4. C7 fracture stabilized 5. Pulmonary embolism treated we will continue with supportive care   I have personally seen and evaluated the patient, evaluated laboratory and imaging results, formulated the assessment and plan and placed orders. The Patient requires high complexity decision making for assessment and support.  Case was discussed on Rounds with the Respiratory Therapy Staff  Allyne Gee, MD [facility] Pulmonary Critical Care Medicine Sleep Medicine

## 2019-01-12 DIAGNOSIS — J9621 Acute and chronic respiratory failure with hypoxia: Secondary | ICD-10-CM | POA: Diagnosis not present

## 2019-01-12 DIAGNOSIS — J181 Lobar pneumonia, unspecified organism: Secondary | ICD-10-CM | POA: Diagnosis not present

## 2019-01-12 DIAGNOSIS — I509 Heart failure, unspecified: Secondary | ICD-10-CM | POA: Diagnosis not present

## 2019-01-12 DIAGNOSIS — S12690S Other displaced fracture of seventh cervical vertebra, sequela: Secondary | ICD-10-CM | POA: Diagnosis not present

## 2019-01-12 LAB — CBC
HCT: 27 % — ABNORMAL LOW (ref 39.0–52.0)
Hemoglobin: 8.6 g/dL — ABNORMAL LOW (ref 13.0–17.0)
MCH: 29.1 pg (ref 26.0–34.0)
MCHC: 31.9 g/dL (ref 30.0–36.0)
MCV: 91.2 fL (ref 80.0–100.0)
Platelets: 435 10*3/uL — ABNORMAL HIGH (ref 150–400)
RBC: 2.96 MIL/uL — ABNORMAL LOW (ref 4.22–5.81)
RDW: 14.7 % (ref 11.5–15.5)
WBC: 11.2 10*3/uL — ABNORMAL HIGH (ref 4.0–10.5)
nRBC: 0 % (ref 0.0–0.2)

## 2019-01-12 LAB — BASIC METABOLIC PANEL
Anion gap: 11 (ref 5–15)
BUN: 21 mg/dL — ABNORMAL HIGH (ref 6–20)
CO2: 33 mmol/L — ABNORMAL HIGH (ref 22–32)
Calcium: 9.1 mg/dL (ref 8.9–10.3)
Chloride: 88 mmol/L — ABNORMAL LOW (ref 98–111)
Creatinine, Ser: <0.3 mg/dL — ABNORMAL LOW (ref 0.61–1.24)
Glucose, Bld: 131 mg/dL — ABNORMAL HIGH (ref 70–99)
Potassium: 4.2 mmol/L (ref 3.5–5.1)
Sodium: 132 mmol/L — ABNORMAL LOW (ref 135–145)

## 2019-01-12 LAB — MAGNESIUM: Magnesium: 2 mg/dL (ref 1.7–2.4)

## 2019-01-12 NOTE — Progress Notes (Signed)
Pulmonary Critical Care Medicine [place]   PULMONARY CRITICAL CARE SERVICE  PROGRESS NOTE  Date of Service: 01/12/2019  Raymond Avila  [id number]  DOB: 1965/07/08   DOA: 12/23/2018  Referring Physician: Merton Border, MD  HPI: Raymond Avila is a 53 y.o. male seen for follow up of Acute on Chronic Respiratory Failure.  Patient right now is on T collar has been doing very well was completing 20 hours yesterday today will be 24 hours off the ventilator  Medications: Reviewed on Rounds  Physical Exam:  Vitals: Temperature is 98.5 pulse 85 respiratory 16 blood pressure 137/78 saturations 95%  Ventilator Settings off the ventilator on T collar FiO2 28%  . General: Comfortable at this time . Eyes: Grossly normal lids, irises & conjunctiva . ENT: grossly tongue is normal . Neck: no obvious mass . Cardiovascular: S1 S2 normal no gallop . Respiratory: No rhonchi no rales are noted at this time . Abdomen: soft . Skin: no rash seen on limited exam . Musculoskeletal: not rigid . Psychiatric:unable to assess . Neurologic: no seizure no involuntary movements         Lab Data:   Basic Metabolic Panel: Recent Labs  Lab 01/05/19 0920 01/07/19 0417 01/10/19 0601 01/12/19 0630  NA 136 131* 128* 132*  K 3.7 3.7 4.1 4.2  CL 89* 86* 87* 88*  CO2 39* 34* 31 33*  GLUCOSE 133* 126* 140* 131*  BUN 20 17 24* 21*  CREATININE 0.35* <0.30* 0.33* <0.30*  CALCIUM 9.2 8.9 9.2 9.1  MG 2.0 2.0 2.1 2.0  PHOS 3.0 2.9 3.7  --     ABG: No results for input(s): PHART, PCO2ART, PO2ART, HCO3, O2SAT in the last 168 hours.  Liver Function Tests: Recent Labs  Lab 01/05/19 0920  ALBUMIN 1.6*   No results for input(s): LIPASE, AMYLASE in the last 168 hours. No results for input(s): AMMONIA in the last 168 hours.  CBC: Recent Labs  Lab 01/05/19 0920 01/07/19 0417 01/10/19 0601 01/12/19 0630  WBC 8.2 10.0 14.4* 11.2*  HGB 8.0* 8.3* 8.6* 8.6*  HCT 25.7* 26.9*  26.6* 27.0*  MCV 92.8 90.9 90.2 91.2  PLT 450* 514* 475* 435*    Cardiac Enzymes: No results for input(s): CKTOTAL, CKMB, CKMBINDEX, TROPONINI in the last 168 hours.  BNP (last 3 results) No results for input(s): BNP in the last 8760 hours.  ProBNP (last 3 results) No results for input(s): PROBNP in the last 8760 hours.  Radiological Exams: No results found.  Assessment/Plan Active Problems:   Acute on chronic respiratory failure with hypoxia (HCC)   Lobar pneumonia, unspecified organism (HCC)   Chronic congestive heart failure (HCC)   Closed C7 fracture without spinal cord injury, sequela   Pulmonary embolism and infarction ([place])   1. Acute on chronic respiratory failure hypoxia patient currently is on 20% FiO2 as noted the goal is for 24 hours off the ventilator patient is also tolerating the PMV well 2. Lobar pneumonia treated improved 3. Chronic congestive heart failure appears to be compensated 4. C7 fracture as neck collar in place 5. Pulmonary embolism we will continue with supportive care has been treated   I have personally seen and evaluated the patient, evaluated laboratory and imaging results, formulated the assessment and plan and placed orders. The Patient requires high complexity decision making for assessment and support.  Case was discussed on Rounds with the Respiratory Therapy Staff  Allyne Gee, MD [facility] Pulmonary Critical Care Medicine Sleep  Medicine

## 2019-01-13 DIAGNOSIS — S12690S Other displaced fracture of seventh cervical vertebra, sequela: Secondary | ICD-10-CM | POA: Diagnosis not present

## 2019-01-13 DIAGNOSIS — J9621 Acute and chronic respiratory failure with hypoxia: Secondary | ICD-10-CM | POA: Diagnosis not present

## 2019-01-13 DIAGNOSIS — I509 Heart failure, unspecified: Secondary | ICD-10-CM | POA: Diagnosis not present

## 2019-01-13 DIAGNOSIS — J181 Lobar pneumonia, unspecified organism: Secondary | ICD-10-CM | POA: Diagnosis not present

## 2019-01-13 NOTE — Progress Notes (Signed)
Pulmonary Critical Care Medicine [place]   PULMONARY CRITICAL CARE SERVICE  PROGRESS NOTE  Date of Service: 01/13/2019  Raymond Avila  [id number]  DOB: Jul 02, 1965   DOA: 12/23/2018  Referring Physician: Merton Border, MD  HPI: Raymond Avila is a 53 y.o. male seen for follow up of Acute on Chronic Respiratory Failure.  Patient was able to complete 24 hours off the ventilator now is going for 48 hours  Medications: Reviewed on Rounds  Physical Exam:  Vitals: Temperature is 100.8 pulse 96 respiratory 23 blood pressure 121/69 saturations 98%  Ventilator Settings off the ventilator on T collar currently on 28% FiO2 the goal is for 48 hours  . General: Comfortable at this time . Eyes: Grossly normal lids, irises & conjunctiva . ENT: grossly tongue is normal . Neck: no obvious mass . Cardiovascular: S1 S2 normal no gallop . Respiratory: No rhonchi coarse breath sounds are noted at this time . Abdomen: soft . Skin: no rash seen on limited exam . Musculoskeletal: not rigid . Psychiatric:unable to assess . Neurologic: no seizure no involuntary movements         Lab Data:   Basic Metabolic Panel: Recent Labs  Lab 01/07/19 0417 01/10/19 0601 01/12/19 0630  NA 131* 128* 132*  K 3.7 4.1 4.2  CL 86* 87* 88*  CO2 34* 31 33*  GLUCOSE 126* 140* 131*  BUN 17 24* 21*  CREATININE <0.30* 0.33* <0.30*  CALCIUM 8.9 9.2 9.1  MG 2.0 2.1 2.0  PHOS 2.9 3.7  --     ABG: No results for input(s): PHART, PCO2ART, PO2ART, HCO3, O2SAT in the last 168 hours.  Liver Function Tests: No results for input(s): AST, ALT, ALKPHOS, BILITOT, PROT, ALBUMIN in the last 168 hours. No results for input(s): LIPASE, AMYLASE in the last 168 hours. No results for input(s): AMMONIA in the last 168 hours.  CBC: Recent Labs  Lab 01/07/19 0417 01/10/19 0601 01/12/19 0630  WBC 10.0 14.4* 11.2*  HGB 8.3* 8.6* 8.6*  HCT 26.9* 26.6* 27.0*  MCV 90.9 90.2 91.2  PLT 514* 475*  435*    Cardiac Enzymes: No results for input(s): CKTOTAL, CKMB, CKMBINDEX, TROPONINI in the last 168 hours.  BNP (last 3 results) No results for input(s): BNP in the last 8760 hours.  ProBNP (last 3 results) No results for input(s): PROBNP in the last 8760 hours.  Radiological Exams: No results found.  Assessment/Plan Active Problems:   Acute on chronic respiratory failure with hypoxia (HCC)   Lobar pneumonia, unspecified organism (HCC)   Chronic congestive heart failure (HCC)   Closed C7 fracture without spinal cord injury, sequela   Pulmonary embolism and infarction ([place])   1. Acute on chronic respiratory failure with hypoxia patient currently is on T collar on 28% FiO2 goal today 48 hours 2. Lobar pneumonia treated improved 3. Chronic congestive heart failure patient is at baseline we will continue to follow 4. Pulmonary embolism treated 5. C7 fracture stable   I have personally seen and evaluated the patient, evaluated laboratory and imaging results, formulated the assessment and plan and placed orders. The Patient requires high complexity decision making for assessment and support.  Case was discussed on Rounds with the Respiratory Therapy Staff  Allyne Gee, MD [facility] Pulmonary Critical Care Medicine Sleep Medicine

## 2019-01-14 ENCOUNTER — Other Ambulatory Visit (HOSPITAL_COMMUNITY): Payer: Medicare Other

## 2019-01-14 DIAGNOSIS — I509 Heart failure, unspecified: Secondary | ICD-10-CM | POA: Diagnosis not present

## 2019-01-14 DIAGNOSIS — S12690S Other displaced fracture of seventh cervical vertebra, sequela: Secondary | ICD-10-CM | POA: Diagnosis not present

## 2019-01-14 DIAGNOSIS — J181 Lobar pneumonia, unspecified organism: Secondary | ICD-10-CM | POA: Diagnosis not present

## 2019-01-14 DIAGNOSIS — J9621 Acute and chronic respiratory failure with hypoxia: Secondary | ICD-10-CM | POA: Diagnosis not present

## 2019-01-14 LAB — CBC
HCT: 31.2 % — ABNORMAL LOW (ref 39.0–52.0)
Hemoglobin: 9.8 g/dL — ABNORMAL LOW (ref 13.0–17.0)
MCH: 28.6 pg (ref 26.0–34.0)
MCHC: 31.4 g/dL (ref 30.0–36.0)
MCV: 91 fL (ref 80.0–100.0)
Platelets: 398 10*3/uL (ref 150–400)
RBC: 3.43 MIL/uL — ABNORMAL LOW (ref 4.22–5.81)
RDW: 15.3 % (ref 11.5–15.5)
WBC: 18.8 10*3/uL — ABNORMAL HIGH (ref 4.0–10.5)
nRBC: 0 % (ref 0.0–0.2)

## 2019-01-14 LAB — BLOOD GAS, ARTERIAL
Acid-Base Excess: 7.1 mmol/L — ABNORMAL HIGH (ref 0.0–2.0)
Bicarbonate: 30.7 mmol/L — ABNORMAL HIGH (ref 20.0–28.0)
FIO2: 28
O2 Saturation: 96.5 %
Patient temperature: 39.1
pCO2 arterial: 44.4 mmHg (ref 32.0–48.0)
pH, Arterial: 7.464 — ABNORMAL HIGH (ref 7.350–7.450)
pO2, Arterial: 93 mmHg (ref 83.0–108.0)

## 2019-01-14 LAB — URINALYSIS, ROUTINE W REFLEX MICROSCOPIC
Glucose, UA: NEGATIVE mg/dL
Ketones, ur: 5 mg/dL — AB
Nitrite: NEGATIVE
Protein, ur: 30 mg/dL — AB
RBC / HPF: >50 RBC/hpf — ABNORMAL HIGH (ref 0–5)
Specific Gravity, Urine: 1.025 (ref 1.005–1.030)
pH: 5 (ref 5.0–8.0)

## 2019-01-14 LAB — COMPREHENSIVE METABOLIC PANEL
ALT: 61 U/L — ABNORMAL HIGH (ref 0–44)
AST: 40 U/L (ref 15–41)
Albumin: 2.3 g/dL — ABNORMAL LOW (ref 3.5–5.0)
Alkaline Phosphatase: 89 U/L (ref 38–126)
Anion gap: 14 (ref 5–15)
BUN: 54 mg/dL — ABNORMAL HIGH (ref 6–20)
CO2: 29 mmol/L (ref 22–32)
Calcium: 9.4 mg/dL (ref 8.9–10.3)
Chloride: 89 mmol/L — ABNORMAL LOW (ref 98–111)
Creatinine, Ser: 1.16 mg/dL (ref 0.61–1.24)
GFR calc Af Amer: >60 mL/min (ref >60)
GFR calc non Af Amer: >60 mL/min (ref >60)
Glucose, Bld: 125 mg/dL — ABNORMAL HIGH (ref 70–99)
Potassium: 4.6 mmol/L (ref 3.5–5.1)
Sodium: 132 mmol/L — ABNORMAL LOW (ref 135–145)
Total Bilirubin: 0.5 mg/dL (ref 0.3–1.2)
Total Protein: 6.7 g/dL (ref 6.5–8.1)

## 2019-01-14 LAB — MAGNESIUM: Magnesium: 2.3 mg/dL (ref 1.7–2.4)

## 2019-01-14 LAB — LACTIC ACID, PLASMA: Lactic Acid, Venous: 1.2 mmol/L (ref 0.5–1.9)

## 2019-01-14 NOTE — Progress Notes (Signed)
PROGRESS NOTE    Raymond Avila  VOZ:[id number] DOB: 02/20/1965 DOA: 12/23/2018  Brief Narrative:  Raymond Beach[place] D Avila is an 53 y.o. 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  He was admitted to outside hospital on 11/23/2018.  He required anterior cervical disc fusion and despite this developed paraplegia.  Postoperatively patient was intubated and had respiratory complications after that.  He developed pneumonia thought to be secondary to Klebsiella.  He had prolonged ventilation and eventually underwent tracheostomy placement.  Patient also had DVT in right lower extremity and left upper extremity which led to pulmonary embolism.  He was initially on heparin drip and then transitioned to Eliquis.  Apparently posterior surgical fusion was postponed secondary to the patient's medical condition and inability to stay prone for the surgery because of the respiratory failure. Due to his medical problems he was [facility].  After admission here patient has been having fever spikes.  Tracheal aspirate cultures showed normal flora, all cultures to date negative.  He was previously treated with IV vancomycin, meropenem and fluconazole.  Continued to have fevers despite being on the antimicrobials.  There was some concern as to his psychiatric medications contributing to the fevers which were discontinued.  He remained afebrile for a brief.  But then started having fevers again associated with loose stools.  Therefore started on p.o. vancomycin.  After he was started on p.o. vancomycin he remained afebrile for a little bit but now having fever spikes again.  In the interim, Foley catheter was removed but had to be replaced secondary to urinary  retention.  01/14/2019: Patient at this time continues to have fever spikes of 101.  He is confused at this time.   Assessment & Plan:   Active Problems:   Acute on chronic respiratory failure with hypoxia (HCC)   Lobar pneumonia, unspecified organism (HCC)   Chronic congestive heart failure (HCC)   Closed C7 fracture status post ACDF with paraplegia   Pulmonary embolism and infarction (HCC) Right lower extremity/left upper extremity DVT Fever Diarrhea COPD Obstructive sleep apnea Morbid obesity Protein calorie malnutrition Dysphagia History of schizoaffective disorder  Acute on chronic hypoxemic respiratory failure: Multifactorial.  Pneumonia was suspected on chest x-ray, also has obstructive sleep apnea obesity hypoventilation syndrome, congestive heart failure, also had recent DVT and PE.    Had recommended CT of the chest abdomen and pelvis given the ongoing fevers.  However, patient could not tolerate the CT therefore unable to get imaging.  Chest x-ray on 01/04/2019 showing mild right basilar atelectasis or infiltrate.  -Regarding his respiratory failure he has been doing fairly okay.  Off ventilator for 72 hours.  Pneumonia: Chest x-ray previously showed findings concerning for pleural effusion, pneumonia? Respiratory culture showing normal flora.    Received treatment with IV vancomycin meropenem, fluconazole.  Previously CT attempt had to be aborted because the patient could not tolerate.  However, respiratory status appears to be stable at this time. Pulmonary following.  Fever: Patient has been having on and off fever since his admission here despite being on antibiotics.  Respiratory cultures previously showed normal flora.  Aspiration?  All other cultures negative. At this time no clear etiology for the fevers.  Antibiotics were discontinued.  His psych medication was also discontinued.  He  remained afebrile briefly but started having diarrhea and initiated on p.o.  vancomycin. -However, now having fevers again.  He also has urinary retention and Foley catheter had to be placed back. -Recommended to send for pan cultures.  After the cultures are sent would recommend to start him on IV ceftriaxone.  Monitor closely.  If he continues to have fevers in 48 hours despite being on ceftriaxone would recommend to add IV vancomycin for gram-positive coverage.  In the meantime continue treatment with p.o. vancomycin.  Diarrhea: Per policy unable to check for stool C. difficile.  However, he is at high risk for C. difficile given prior antibiotic use.  Started on p.o. vancomycin.  Right lower extremity DVT/left upper extremity DVT/pulmonary embolism: He was on IV heparin at the outside facility now transitioned to Eliquis.  Further management per the primary team.  C7 fracture status post ACDF surgery: Surgical site stable.  Unfortunately has paraplegia with weakness.  Continue supportive management per the primary team.  COPD: Continue medication and management per primary team and pulmonary.  Morbid obesity/obstructive sleep apnea: Patient admits to not using the CPAP.  Continue management per primary team and pulmonary.  Protein calorie malnutrition: Continue management per primary team.  Dysphagia: Unfortunately due to his dysphagia he is high risk for aspiration and worsening respiratory failure secondary to aspiration pneumonia.  Schizoaffective disorder: His psych medication was discontinued due to concern for fever. Further medication and management per the primary team.   Due to his complex medical problems he is very high risk for worsening and decompensation.    Subjective: He is having fevers again.  Also having diarrhea.  Objective: Vitals: Temperature 101, pulse 97, respiratory rate 22, blood pressure 109/62, pulse oximetry 98%  Examination:  Constitutional: Morbidly obese male, awake but confused HEENT: PERLA, EOMI, irises appear  normal, anicteric sclera, no ear or nose lesions Neck: He has a c-collar in place, has trach CVS: S1-S2, no murmur Respiratory: occasional wheezing, no rhonchi Abdomen: Obese male, soft nontender, normal bowel sounds Musculoskeletal: Edema of bilateral upper extremities and bilateral lower extremities Neuro: He has weakness in all his extremities. Psych: confused Skin: no rashes      Data Reviewed: I have personally reviewed following labs and imaging studies  CBC: Recent Labs  Lab 01/10/19 0601 01/12/19 0630 01/14/19 1033  WBC 14.4* 11.2* 18.8*  HGB 8.6* 8.6* 9.8*  HCT 26.6* 27.0* 31.2*  MCV 90.2 91.2 91.0  PLT 475* 435* 329   Basic Metabolic Panel: Recent Labs  Lab 01/10/19 0601 01/12/19 0630 01/14/19 1033  NA 128* 132* 132*  K 4.1 4.2 4.6  CL 87* 88* 89*  CO2 31 33* 29  GLUCOSE 140* 131* 125*  BUN 24* 21* 54*  CREATININE 0.33* <0.30* 1.16  CALCIUM 9.2 9.1 9.4  MG 2.1 2.0 2.3  PHOS 3.7  --   --    GFR: CrCl cannot be calculated (Unknown ideal weight.). Liver Function Tests: Recent Labs  Lab 01/14/19 1033  AST 40  ALT 61*  ALKPHOS 89  BILITOT 0.5  PROT 6.7  ALBUMIN 2.3*   No results for input(s): LIPASE, AMYLASE in the last 168 hours. No results for input(s): AMMONIA in the last 168 hours. Coagulation Profile: No results for input(s): INR, PROTIME in the last 168 hours. Cardiac Enzymes: No results for input(s): CKTOTAL, CKMB, CKMBINDEX, TROPONINI in the last 168 hours. BNP (last 3 results) No results for input(s): PROBNP in the last 8760 hours. HbA1C: No results for input(s):  HGBA1C in the last 72 hours. CBG: No results for input(s): GLUCAP in the last 168 hours. Lipid Profile: No results for input(s): CHOL, HDL, LDLCALC, TRIG, CHOLHDL, LDLDIRECT in the last 72 hours. Thyroid Function Tests: No results for input(s): TSH, T4TOTAL, FREET4, T3FREE, THYROIDAB in the last 72 hours. Anemia Panel: No results for input(s): VITAMINB12, FOLATE,  FERRITIN, TIBC, IRON, RETICCTPCT in the last 72 hours. Sepsis Labs: No results for input(s): PROCALCITON, LATICACIDVEN in the last 168 hours.  No results found for this or any previous visit (from the past 240 hour(s)).       Radiology Studies: Dg Chest Port 1 View  Result Date: 01/14/2019 CLINICAL DATA:  Pneumonia. Fever. EXAM: PORTABLE CHEST 1 VIEW COMPARISON:  One-view chest x-ray a 01/02/2019 FINDINGS: The heart is enlarged. Tracheostomy tube is in place. Lung volumes remain low with elevation of the right hemidiaphragm. Aeration is improved. Right greater than left basilar airspace opacities are present. IMPRESSION: 1. Improving aeration with persistent right greater than left basilar airspace disease. While this likely reflects atelectasis, infection is not excluded. 2. Stable cardiomegaly. Electronically Signed   By: Marin Roberts M.D.   On: 01/14/2019 06:57    Scheduled Meds: Please see MAR   Vonzella Nipple, MD 01/14/2019, 1:02 PM

## 2019-01-14 NOTE — Progress Notes (Signed)
Pulmonary Critical Care Medicine [facility]   PULMONARY CRITICAL CARE SERVICE  PROGRESS NOTE  Date of Service: 01/14/2019  Raymond Avila  [id number]  DOB: 06-Jan-1966   DOA: 12/23/2018  Referring Physician: [name], MD  HPI: Raymond Avila is a 53 y.o. male seen for follow up of Acute on Chronic Respiratory Failure.  Patient right now is on T collar however did have a fever overnight he is otherwise doing very well.  Has been off the ventilator for 72 hours  Medications: Reviewed on Rounds  Physical Exam:  Vitals: Temperature one 1.9 pulse 98 respiratory 23 blood pressure 105/57 saturations 95%  Ventilator Settings off the ventilator on T collar  . General: Comfortable at this time . Eyes: Grossly normal lids, irises & conjunctiva . ENT: grossly tongue is normal . Neck: no obvious mass . Cardiovascular: S1 S2 normal no gallop . Respiratory: No rhonchi no rales are noted at this time . Abdomen: soft . Skin: no rash seen on limited exam . Musculoskeletal: not rigid . Psychiatric:unable to assess . Neurologic: no seizure no involuntary movements         Lab Data:   Basic Metabolic Panel: Recent Labs  Lab 01/10/19 0601 01/12/19 0630  NA 128* 132*  K 4.1 4.2  CL 87* 88*  CO2 31 33*  GLUCOSE 140* 131*  BUN 24* 21*  CREATININE 0.33* <0.30*  CALCIUM 9.2 9.1  MG 2.1 2.0  PHOS 3.7  --     ABG: No results for input(s): PHART, PCO2ART, PO2ART, HCO3, O2SAT in the last 168 hours.  Liver Function Tests: No results for input(s): AST, ALT, ALKPHOS, BILITOT, PROT, ALBUMIN in the last 168 hours. No results for input(s): LIPASE, AMYLASE in the last 168 hours. No results for input(s): AMMONIA in the last 168 hours.  CBC: Recent Labs  Lab 01/10/19 0601 01/12/19 0630  WBC 14.4* 11.2*  HGB 8.6* 8.6*  HCT 26.6* 27.0*  MCV 90.2 91.2  PLT 475* 435*    Cardiac Enzymes: No results for input(s): CKTOTAL, CKMB, CKMBINDEX, TROPONINI in the last  168 hours.  BNP (last 3 results) No results for input(s): BNP in the last 8760 hours.  ProBNP (last 3 results) No results for input(s): PROBNP in the last 8760 hours.  Radiological Exams: Dg Chest Port 1 View  Result Date: 01/14/2019 CLINICAL DATA:  Pneumonia. Fever. EXAM: PORTABLE CHEST 1 VIEW COMPARISON:  One-view chest x-ray a 01/02/2019 FINDINGS: The heart is enlarged. Tracheostomy tube is in place. Lung volumes remain low with elevation of the right hemidiaphragm. Aeration is improved. Right greater than left basilar airspace opacities are present. IMPRESSION: 1. Improving aeration with persistent right greater than left basilar airspace disease. While this likely reflects atelectasis, infection is not excluded. 2. Stable cardiomegaly. Electronically Signed   By: Marin Roberts M.D.   On: 01/14/2019 06:57    Assessment/Plan Active Problems:   Acute on chronic respiratory failure with hypoxia (HCC)   Lobar pneumonia, unspecified organism (HCC)   Chronic congestive heart failure (HCC)   Closed C7 fracture without spinal cord injury, sequela   Pulmonary embolism and infarction (HCC)   1. Acute on chronic respiratory failure hypoxia patient is doing well from a bleeding perspective however still has fevers.  Would consider follow-up with ID 2. Lobar pneumonia the last chest x-ray which was ordered shows improved aeration with some bibasilar airspace disease likely atelectasis.  Fever cannot be explained only by atelectasis 3. Chronic congestive heart failure  at baseline we will continue supportive care 4. C7 fracture has a neck collar in place 5. Pulmonary embolism treated we will continue to monitor   I have personally seen and evaluated the patient, evaluated laboratory and imaging results, formulated the assessment and plan and placed orders. The Patient requires high complexity decision making for assessment and support.  Case was discussed on Rounds with the Respiratory  Therapy Staff  Allyne Gee, MD [facility] Pulmonary Critical Care Medicine Sleep Medicine

## 2019-01-15 ENCOUNTER — Encounter (HOSPITAL_BASED_OUTPATIENT_CLINIC_OR_DEPARTMENT_OTHER): Payer: Medicare Other

## 2019-01-15 DIAGNOSIS — J181 Lobar pneumonia, unspecified organism: Secondary | ICD-10-CM | POA: Diagnosis not present

## 2019-01-15 DIAGNOSIS — S12690S Other displaced fracture of seventh cervical vertebra, sequela: Secondary | ICD-10-CM | POA: Diagnosis not present

## 2019-01-15 DIAGNOSIS — I82409 Acute embolism and thrombosis of unspecified deep veins of unspecified lower extremity: Secondary | ICD-10-CM | POA: Diagnosis not present

## 2019-01-15 DIAGNOSIS — I509 Heart failure, unspecified: Secondary | ICD-10-CM | POA: Diagnosis not present

## 2019-01-15 DIAGNOSIS — J9621 Acute and chronic respiratory failure with hypoxia: Secondary | ICD-10-CM | POA: Diagnosis not present

## 2019-01-15 LAB — CBC (CANCER CENTER ONLY)
HCT: 23.5 % — ABNORMAL LOW (ref 39.0–52.0)
Hemoglobin: 7.5 g/dL — ABNORMAL LOW (ref 13.0–17.0)
MCH: 29.1 pg (ref 26.0–34.0)
MCHC: 31.9 g/dL (ref 30.0–36.0)
MCV: 91.1 fL (ref 80.0–100.0)
Platelet Count: 317 10*3/uL (ref 150–400)
RBC: 2.58 MIL/uL — ABNORMAL LOW (ref 4.22–5.81)
RDW: 15.3 % (ref 11.5–15.5)
WBC Count: 16.3 10*3/uL — ABNORMAL HIGH (ref 4.0–10.5)
nRBC: 0 % (ref 0.0–0.2)

## 2019-01-15 LAB — URINE CULTURE: Culture: <10000 — AB

## 2019-01-15 LAB — BASIC METABOLIC PANEL
Anion gap: 12 (ref 5–15)
BUN: 79 mg/dL — ABNORMAL HIGH (ref 6–20)
CO2: 28 mmol/L (ref 22–32)
Calcium: 8.8 mg/dL — ABNORMAL LOW (ref 8.9–10.3)
Chloride: 94 mmol/L — ABNORMAL LOW (ref 98–111)
Creatinine, Ser: 0.91 mg/dL (ref 0.61–1.24)
GFR calc Af Amer: >60 mL/min (ref >60)
GFR calc non Af Amer: >60 mL/min (ref >60)
Glucose, Bld: 128 mg/dL — ABNORMAL HIGH (ref 70–99)
Potassium: 3.3 mmol/L — ABNORMAL LOW (ref 3.5–5.1)
Sodium: 134 mmol/L — ABNORMAL LOW (ref 135–145)

## 2019-01-15 LAB — OCCULT BLOOD X 1 CARD TO LAB, STOOL: Fecal Occult Bld: NEGATIVE

## 2019-01-15 NOTE — Progress Notes (Signed)
Upper extremity venous has been completed.   Preliminary results in CV Proc.   Abram Sander 01/15/2019 10:34 AM

## 2019-01-15 NOTE — Progress Notes (Signed)
Pulmonary Critical Care Medicine [place]   PULMONARY CRITICAL CARE SERVICE  PROGRESS NOTE  Date of Service: 01/15/2019  Raymond Avila  [id number]:[id number]  DOB: June 11, 1965   DOA: 12/23/2018  Referring Physician: Merton Border, MD  HPI: Raymond Avila is a 53 y.o. male seen for follow up of Acute on Chronic Respiratory Failure.  Patient is doing well and actually did well yesterday out of bed he is off the ventilator now for more than 96 hours.  Remains on T collar and requiring 28% FiO2  Medications: Reviewed on Rounds  Physical Exam:  Vitals: Temperature 95.9 pulse 97 respiratory 12 blood pressure 95/55 saturations 98%  Ventilator Settings off the ventilator on T collar FiO2 20%  . General: Comfortable at this time . Eyes: Grossly normal lids, irises & conjunctiva . ENT: grossly tongue is normal . Neck: no obvious mass . Cardiovascular: S1 S2 normal no gallop . Respiratory: No rhonchi no rales are noted at this time . Abdomen: soft . Skin: no rash seen on limited exam . Musculoskeletal: not rigid . Psychiatric:unable to assess . Neurologic: no seizure no involuntary movements         Lab Data:   Basic Metabolic Panel: Recent Labs  Lab 01/10/19 0601 01/12/19 0630 01/14/19 1033  NA 128* 132* 132*  K 4.1 4.2 4.6  CL 87* 88* 89*  CO2 31 33* 29  GLUCOSE 140* 131* 125*  BUN 24* 21* 54*  CREATININE 0.33* <0.30* 1.16  CALCIUM 9.2 9.1 9.4  MG 2.1 2.0 2.3  PHOS 3.7  --   --     ABG: Recent Labs  Lab 01/14/19 1430  PHART 7.464*  PCO2ART 44.4  PO2ART 93.0  HCO3 30.7*  O2SAT 96.5    Liver Function Tests: Recent Labs  Lab 01/14/19 1033  AST 40  ALT 61*  ALKPHOS 89  BILITOT 0.5  PROT 6.7  ALBUMIN 2.3*   No results for input(s): LIPASE, AMYLASE in the last 168 hours. No results for input(s): AMMONIA in the last 168 hours.  CBC: Recent Labs  Lab 01/10/19 0601 01/12/19 0630 01/14/19 1033  WBC 14.4* 11.2* 18.8*  HGB 8.6* 8.6*  9.8*  HCT 26.6* 27.0* 31.2*  MCV 90.2 91.2 91.0  PLT 475* 435* 398    Cardiac Enzymes: No results for input(s): CKTOTAL, CKMB, CKMBINDEX, TROPONINI in the last 168 hours.  BNP (last 3 results) No results for input(s): BNP in the last 8760 hours.  ProBNP (last 3 results) No results for input(s): PROBNP in the last 8760 hours.  Radiological Exams: US Renal  Result Date: 01/14/2019 CLINICAL DATA:  53 year old male with acute renal insufficiency. EXAM: RENAL / URINARY TRACT ULTRASOUND COMPLETE COMPARISON:  None. FINDINGS: Right Kidney: Renal measurements: 14.5 x 6.9 x 6.9 cm = volume: 354 mL. Normal echogenicity. No hydronephrosis or shadowing stone. Left Kidney: Renal measurements: 14.3 x 7.5 x 6.9 cm = volume: 386 mL. Normal echogenicity. No hydronephrosis or shadowing stone. Bladder: The urinary bladder is decompressed around a Foley catheter. Other: None. IMPRESSION: No hydronephrosis or shadowing stone. Electronically Signed   By: Anner Crete M.D.   On: 01/14/2019 20:29   Dg Chest Port 1 View  Result Date: 01/14/2019 CLINICAL DATA:  Pneumonia. Fever. EXAM: PORTABLE CHEST 1 VIEW COMPARISON:  One-view chest x-ray a 01/02/2019 FINDINGS: The heart is enlarged. Tracheostomy tube is in place. Lung volumes remain low with elevation of the right hemidiaphragm. Aeration is improved. Right greater than left basilar airspace opacities  are present. IMPRESSION: 1. Improving aeration with persistent right greater than left basilar airspace disease. While this likely reflects atelectasis, infection is not excluded. 2. Stable cardiomegaly. Electronically Signed   By: Marin Roberts M.D.   On: 01/14/2019 06:57    Assessment/Plan Active Problems:   Acute on chronic respiratory failure with hypoxia (HCC)   Lobar pneumonia, unspecified organism (HCC)   Chronic congestive heart failure (HCC)   Closed C7 fracture without spinal cord injury, sequela   Pulmonary embolism and infarction  (HCC)   1. Acute on chronic respiratory failure hypoxia plan is to continue to wean on T collar.  Will continue to assess readiness to work towards capping 2. Lobar pneumonia treated clinically is improved there is improved aeration noted on the chest films 3. Chronic congestive heart failure improving we will continue to monitor fluid status 4. C7 fracture has neck collar in place 5. Pulmonary embolism treated   I have personally seen and evaluated the patient, evaluated laboratory and imaging results, formulated the assessment and plan and placed orders. The Patient requires high complexity decision making for assessment and support.  Case was discussed on Rounds with the Respiratory Therapy Staff  Yevonne Pax, MD [facility] Pulmonary Critical Care Medicine Sleep Medicine

## 2019-01-16 ENCOUNTER — Other Ambulatory Visit (HOSPITAL_COMMUNITY): Payer: Medicare Other

## 2019-01-16 DIAGNOSIS — J181 Lobar pneumonia, unspecified organism: Secondary | ICD-10-CM | POA: Diagnosis not present

## 2019-01-16 DIAGNOSIS — S12690S Other displaced fracture of seventh cervical vertebra, sequela: Secondary | ICD-10-CM | POA: Diagnosis not present

## 2019-01-16 DIAGNOSIS — J9621 Acute and chronic respiratory failure with hypoxia: Secondary | ICD-10-CM | POA: Diagnosis not present

## 2019-01-16 DIAGNOSIS — I509 Heart failure, unspecified: Secondary | ICD-10-CM | POA: Diagnosis not present

## 2019-01-16 LAB — CBC
HCT: 23.4 % — ABNORMAL LOW (ref 39.0–52.0)
Hemoglobin: 7.1 g/dL — ABNORMAL LOW (ref 13.0–17.0)
MCH: 28.2 pg (ref 26.0–34.0)
MCHC: 30.3 g/dL (ref 30.0–36.0)
MCV: 92.9 fL (ref 80.0–100.0)
Platelets: 331 10*3/uL (ref 150–400)
RBC: 2.52 MIL/uL — ABNORMAL LOW (ref 4.22–5.81)
RDW: 15.2 % (ref 11.5–15.5)
WBC: 11.9 10*3/uL — ABNORMAL HIGH (ref 4.0–10.5)
nRBC: 0 % (ref 0.0–0.2)

## 2019-01-16 LAB — BASIC METABOLIC PANEL
Anion gap: 8 (ref 5–15)
BUN: 69 mg/dL — ABNORMAL HIGH (ref 6–20)
CO2: 30 mmol/L (ref 22–32)
Calcium: 8.8 mg/dL — ABNORMAL LOW (ref 8.9–10.3)
Chloride: 99 mmol/L (ref 98–111)
Creatinine, Ser: 0.48 mg/dL — ABNORMAL LOW (ref 0.61–1.24)
GFR calc Af Amer: >60 mL/min (ref >60)
GFR calc non Af Amer: >60 mL/min (ref >60)
Glucose, Bld: 164 mg/dL — ABNORMAL HIGH (ref 70–99)
Potassium: 3.4 mmol/L — ABNORMAL LOW (ref 3.5–5.1)
Sodium: 137 mmol/L (ref 135–145)

## 2019-01-16 LAB — MAGNESIUM: Magnesium: 2.7 mg/dL — ABNORMAL HIGH (ref 1.7–2.4)

## 2019-01-16 NOTE — Progress Notes (Signed)
[facility] [place]   PULMONARY CRITICAL CARE SERVICE  PROGRESS NOTE  Date of Service: 01/16/2019  Raymond Avila  [id number]  DOB: 04/07/1965   DOA: 12/23/2018  Referring Physician: Carron CurieAli Hijazi, MD  HPI: Raymond Avila is a 53 y.o. male seen for follow up of Acute on Chronic Respiratory Failure.  Patient is liberated from the ventilator remains on T collar is actually doing well has been tolerating 28% FiO2 also has been tolerating the PMV  Medications: Reviewed on Rounds  Physical Exam:  Vitals: Temperature 98.6 pulse 87 respiratory 28 blood pressure 107/58 saturations 99%  Ventilator Settings off the vent on T collar currently on 28% FiO2 using PMV  . General: Comfortable at this time . Eyes: Grossly normal lids, irises & conjunctiva . ENT: grossly tongue is normal . Neck: no obvious mass . Cardiovascular: S1 S2 normal no gallop . Respiratory: No rhonchi no rales are noted at this time . Abdomen: soft . Skin: no rash seen on limited exam . Musculoskeletal: not rigid . Psychiatric:unable to assess . Neurologic: no seizure no involuntary movements         Lab Data:   Basic Metabolic Panel: Recent Labs  Lab 01/10/19 0601 01/12/19 0630 01/14/19 1033 01/15/19 1208 01/16/19 1058  NA 128* 132* 132* 134* 137  K 4.1 4.2 4.6 3.3* 3.4*  CL 87* 88* 89* 94* 99  CO2 31 33* 29 28 30   GLUCOSE 140* 131* 125* 128* 164*  BUN 24* 21* 54* 79* 69*  CREATININE 0.33* <0.30* 1.16 0.91 0.48*  CALCIUM 9.2 9.1 9.4 8.8* 8.8*  MG 2.1 2.0 2.3  --  2.7*  PHOS 3.7  --   --   --   --     ABG: Recent Labs  Lab 01/14/19 1430  PHART 7.464*  PCO2ART 44.4  PO2ART 93.0  HCO3 30.7*  O2SAT 96.5    Liver Function Tests: Recent Labs  Lab 01/14/19 1033  AST 40  ALT 61*  ALKPHOS 89  BILITOT 0.5  PROT 6.7  ALBUMIN 2.3*   No results for input(s): LIPASE, AMYLASE in the last 168 hours. No results for input(s): AMMONIA in the last 168  hours.  CBC: Recent Labs  Lab 01/10/19 0601 01/12/19 0630 01/14/19 1033 01/15/19 1208 01/16/19 1058  WBC 14.4* 11.2* 18.8* 16.3* 11.9*  HGB 8.6* 8.6* 9.8* 7.5* 7.1*  HCT 26.6* 27.0* 31.2* 23.5* 23.4*  MCV 90.2 91.2 91.0 91.1 92.9  PLT 475* 435* 398 317 331    Cardiac Enzymes: No results for input(s): CKTOTAL, CKMB, CKMBINDEX, TROPONINI in the last 168 hours.  BNP (last 3 results) No results for input(s): BNP in the last 8760 hours.  ProBNP (last 3 results) No results for input(s): PROBNP in the last 8760 hours.  Radiological Exams: [place] Renal  Result Date: 01/14/2019 CLINICAL DATA:  53 year old male with acute renal insufficiency. EXAM: RENAL / URINARY TRACT ULTRASOUND COMPLETE COMPARISON:  None. FINDINGS: Right Kidney: Renal measurements: 14.5 x 6.9 x 6.9 cm = volume: 354 mL. Normal echogenicity. No hydronephrosis or shadowing stone. Left Kidney: Renal measurements: 14.3 x 7.5 x 6.9 cm = volume: 386 mL. Normal echogenicity. No hydronephrosis or shadowing stone. Bladder: The urinary bladder is decompressed around a Foley catheter. Other: None. IMPRESSION: No hydronephrosis or shadowing stone. Electronically Signed   By: Elgie CollardArash  Radparvar M.D.   On: 01/14/2019 20:29   Dg Chest Port 1 View  Result Date: 01/16/2019 CLINICAL DATA:  Atelectasis EXAM: PORTABLE  CHEST 1 VIEW COMPARISON:  01/14/2019 FINDINGS: Tracheostomy tube unchanged. Anterior cervical fusion noted. Some improvement in aeration lung bases. Persistent basilar atelectasis. Upper lungs clear. IMPRESSION: 1. Improvement in aeration to the lung bases. 2. Persistent basilar atelectasis. Electronically Signed   By: Suzy Bouchard M.D.   On: 01/16/2019 07:53   Vas [place] Upper Extremity Venous Duplex  Result Date: 01/15/2019 UPPER VENOUS STUDY  Indications: Swelling, and Edema Limitations: Orthopaedic appliance, line and patient positioning. Comparison Study: no prior Performing Technologist: Abram Sander RVS  Examination  Guidelines: A complete evaluation includes B-mode imaging, spectral Doppler, color Doppler, and power Doppler as needed of all accessible portions of each vessel. Bilateral testing is considered an integral part of a complete examination. Limited examinations for reoccurring indications may be performed as noted.  Right Findings: +----------+------------+---------+-----------+----------+---------------------+ RIGHT     CompressiblePhasicitySpontaneousProperties       Summary        +----------+------------+---------+-----------+----------+---------------------+ IJV                                                 not visualized due to                                                          neck brace       +----------+------------+---------+-----------+----------+---------------------+ Subclavian    Full       Yes       Yes                                    +----------+------------+---------+-----------+----------+---------------------+ Axillary      Full       Yes       Yes                                    +----------+------------+---------+-----------+----------+---------------------+ Brachial      Full       Yes       Yes                                    +----------+------------+---------+-----------+----------+---------------------+ Radial        Full                                                        +----------+------------+---------+-----------+----------+---------------------+ Ulnar         Full                                                        +----------+------------+---------+-----------+----------+---------------------+ Cephalic      Full                                                        +----------+------------+---------+-----------+----------+---------------------+  Basilic                                                Not visualized      +----------+------------+---------+-----------+----------+---------------------+  Left Findings: +----------+------------+---------+-----------+----------+---------------------+ LEFT      CompressiblePhasicitySpontaneousProperties       Summary        +----------+------------+---------+-----------+----------+---------------------+ IJV                                                 not visualized due to                                                          neck brace       +----------+------------+---------+-----------+----------+---------------------+ Subclavian    Full       Yes       Yes                                    +----------+------------+---------+-----------+----------+---------------------+ Axillary      Full       Yes       Yes                                    +----------+------------+---------+-----------+----------+---------------------+ Brachial      Full       Yes       Yes                                    +----------+------------+---------+-----------+----------+---------------------+ Radial        Full                                                        +----------+------------+---------+-----------+----------+---------------------+ Ulnar         Full                                                        +----------+------------+---------+-----------+----------+---------------------+ Cephalic      Full                                                        +----------+------------+---------+-----------+----------+---------------------+ Basilic  Not visualized     +----------+------------+---------+-----------+----------+---------------------+  Summary:  Right: No evidence of deep vein thrombosis in the upper extremity. No evidence of superficial vein thrombosis in the upper extremity. This was a limited study.  Left: No evidence of deep vein thrombosis in the upper  extremity. No evidence of superficial vein thrombosis in the upper extremity. This was a limited study.  *See table(s) above for measurements and observations.  Diagnosing physician: Waverly Ferrari MD Electronically signed by Waverly Ferrari MD on 01/15/2019 at 7:27:59 PM.    Final     Assessment/Plan Active Problems:   Acute on chronic respiratory failure with hypoxia (HCC)   Lobar pneumonia, unspecified organism (HCC)   Chronic congestive heart failure (HCC)   Closed C7 fracture without spinal cord injury, sequela   Pulmonary embolism and infarction (HCC)   1. Acute on chronic respiratory failure with hypoxia we will continue with T collar trials and use the PMV as tolerated 2. Lobar pneumonia treated we will continue secretion management pulmonary toilet 3. Chronic congestive heart failure appears to be compensated at this time 4. C7 fracture at baseline continue with supportive care 5. Pulmonary embolism treated we will continue to monitor the latest extremity ultrasound does not show a DVT   I have personally seen and evaluated the patient, evaluated laboratory and imaging results, formulated the assessment and plan and placed orders. The Patient requires high complexity decision making for assessment and support.  Case was discussed on Rounds with the Respiratory Therapy Staff  Yevonne Pax, MD [facility] Pulmonary Critical Care Medicine Sleep Medicine

## 2019-01-17 DIAGNOSIS — S12690S Other displaced fracture of seventh cervical vertebra, sequela: Secondary | ICD-10-CM | POA: Diagnosis not present

## 2019-01-17 DIAGNOSIS — J9621 Acute and chronic respiratory failure with hypoxia: Secondary | ICD-10-CM | POA: Diagnosis not present

## 2019-01-17 DIAGNOSIS — I509 Heart failure, unspecified: Secondary | ICD-10-CM | POA: Diagnosis not present

## 2019-01-17 DIAGNOSIS — J181 Lobar pneumonia, unspecified organism: Secondary | ICD-10-CM | POA: Diagnosis not present

## 2019-01-17 LAB — BASIC METABOLIC PANEL
Anion gap: 12 (ref 5–15)
BUN: 29 mg/dL — ABNORMAL HIGH (ref 6–20)
CO2: 32 mmol/L (ref 22–32)
Calcium: 8.1 mg/dL — ABNORMAL LOW (ref 8.9–10.3)
Chloride: 105 mmol/L (ref 98–111)
Creatinine, Ser: <0.3 mg/dL — ABNORMAL LOW (ref 0.61–1.24)
Glucose, Bld: 142 mg/dL — ABNORMAL HIGH (ref 70–99)
Potassium: 3.4 mmol/L — ABNORMAL LOW (ref 3.5–5.1)
Sodium: 149 mmol/L — ABNORMAL HIGH (ref 135–145)

## 2019-01-17 LAB — CBC
HCT: 23.1 % — ABNORMAL LOW (ref 39.0–52.0)
Hemoglobin: 7.4 g/dL — ABNORMAL LOW (ref 13.0–17.0)
MCH: 29.6 pg (ref 26.0–34.0)
MCHC: 32 g/dL (ref 30.0–36.0)
MCV: 92.4 fL (ref 80.0–100.0)
Platelets: 285 10*3/uL (ref 150–400)
RBC: 2.5 MIL/uL — ABNORMAL LOW (ref 4.22–5.81)
RDW: 14.5 % (ref 11.5–15.5)
WBC: 7.9 10*3/uL (ref 4.0–10.5)
nRBC: 0 % (ref 0.0–0.2)

## 2019-01-17 LAB — CULTURE, RESPIRATORY W GRAM STAIN: Culture: NORMAL

## 2019-01-17 NOTE — Progress Notes (Signed)
Pulmonary Critical Care Medicine [facility] [place]   PULMONARY CRITICAL CARE SERVICE  PROGRESS NOTE  Date of Service: 01/17/2019  [name]  [id number]  DOB: 1965-05-20   DOA: 12/23/2018  Referring Physician: [name], MD  HPI: Raymond Avila is a 53 y.o. male seen for follow up of Acute on Chronic Respiratory Failure.  Patient remains off the ventilator currently is on trach collar has been on 20% FiO2  Medications: Reviewed on Rounds  Physical Exam:  Vitals: Temperature 97.7 pulse 79 respiratory 12 blood pressure 128/71 saturations 98%  Ventilator Settings off the ventilator right now on T collar with an FiO2 of 28%  . General: Comfortable at this time . Eyes: Grossly normal lids, irises & conjunctiva . ENT: grossly tongue is normal . Neck: no obvious mass . Cardiovascular: S1 S2 normal no gallop . Respiratory: No rhonchi no rales are noted at this time . Abdomen: soft . Skin: no rash seen on limited exam . Musculoskeletal: not rigid . Psychiatric:unable to assess . Neurologic: no seizure no involuntary movements         Lab Data:   Basic Metabolic Panel: Recent Labs  Lab 01/12/19 0630 01/14/19 1033 01/15/19 1208 01/16/19 1058 01/17/19 1546  NA 132* 132* 134* 137 149*  K 4.2 4.6 3.3* 3.4* 3.4*  CL 88* 89* 94* 99 105  CO2 33* 29 28 30  32  GLUCOSE 131* 125* 128* 164* 142*  BUN 21* 54* 79* 69* 29*  CREATININE <0.30* 1.16 0.91 0.48* <0.30*  CALCIUM 9.1 9.4 8.8* 8.8* 8.1*  MG 2.0 2.3  --  2.7*  --     ABG: Recent Labs  Lab 01/14/19 1430  PHART 7.464*  PCO2ART 44.4  PO2ART 93.0  HCO3 30.7*  O2SAT 96.5    Liver Function Tests: Recent Labs  Lab 01/14/19 1033  AST 40  ALT 61*  ALKPHOS 89  BILITOT 0.5  PROT 6.7  ALBUMIN 2.3*   No results for input(s): LIPASE, AMYLASE in the last 168 hours. No results for input(s): AMMONIA in the last 168 hours.  CBC: Recent Labs  Lab 01/12/19 0630 01/14/19 1033 01/15/19 1208  01/16/19 1058 01/17/19 1546  WBC 11.2* 18.8* 16.3* 11.9* 7.9  HGB 8.6* 9.8* 7.5* 7.1* 7.4*  HCT 27.0* 31.2* 23.5* 23.4* 23.1*  MCV 91.2 91.0 91.1 92.9 92.4  PLT 435* 398 317 331 285    Cardiac Enzymes: No results for input(s): CKTOTAL, CKMB, CKMBINDEX, TROPONINI in the last 168 hours.  BNP (last 3 results) No results for input(s): BNP in the last 8760 hours.  ProBNP (last 3 results) No results for input(s): PROBNP in the last 8760 hours.  Radiological Exams: Dg Chest Port 1 View  Result Date: 01/16/2019 CLINICAL DATA:  Atelectasis EXAM: PORTABLE CHEST 1 VIEW COMPARISON:  01/14/2019 FINDINGS: Tracheostomy tube unchanged. Anterior cervical fusion noted. Some improvement in aeration lung bases. Persistent basilar atelectasis. Upper lungs clear. IMPRESSION: 1. Improvement in aeration to the lung bases. 2. Persistent basilar atelectasis. Electronically Signed   By: 14/04/2018 M.D.   On: 01/16/2019 07:53    Assessment/Plan Active Problems:   Acute on chronic respiratory failure with hypoxia (HCC)   Lobar pneumonia, unspecified organism (HCC)   Chronic congestive heart failure (HCC)   Closed C7 fracture without spinal cord injury, sequela   Pulmonary embolism and infarction (HCC)   1. Acute on chronic respiratory failure hypoxia patient continues on T collar wean 2. Lobar pneumonia treated slowly improving 3. Chronic congestive  heart failure monitor fluid status diurese as tolerated 4. C7 fracture stable 5. Pulmonary embolism treated   I have personally seen and evaluated the patient, evaluated laboratory and imaging results, formulated the assessment and plan and placed orders. The Patient requires high complexity decision making for assessment and support.  Case was discussed on Rounds with the Respiratory Therapy Staff  Allyne Gee, MD [facility] Pulmonary Critical Care Medicine Sleep Medicine

## 2019-01-18 DIAGNOSIS — J181 Lobar pneumonia, unspecified organism: Secondary | ICD-10-CM | POA: Diagnosis not present

## 2019-01-18 DIAGNOSIS — I509 Heart failure, unspecified: Secondary | ICD-10-CM | POA: Diagnosis not present

## 2019-01-18 DIAGNOSIS — J9621 Acute and chronic respiratory failure with hypoxia: Secondary | ICD-10-CM | POA: Diagnosis not present

## 2019-01-18 DIAGNOSIS — S12690S Other displaced fracture of seventh cervical vertebra, sequela: Secondary | ICD-10-CM | POA: Diagnosis not present

## 2019-01-18 LAB — BASIC METABOLIC PANEL
Anion gap: 13 (ref 5–15)
BUN: 30 mg/dL — ABNORMAL HIGH (ref 6–20)
CO2: 29 mmol/L (ref 22–32)
Calcium: 9.5 mg/dL (ref 8.9–10.3)
Chloride: 101 mmol/L (ref 98–111)
Creatinine, Ser: 0.44 mg/dL — ABNORMAL LOW (ref 0.61–1.24)
GFR calc Af Amer: >60 mL/min (ref >60)
GFR calc non Af Amer: >60 mL/min (ref >60)
Glucose, Bld: 170 mg/dL — ABNORMAL HIGH (ref 70–99)
Potassium: 5.5 mmol/L — ABNORMAL HIGH (ref 3.5–5.1)
Sodium: 143 mmol/L (ref 135–145)

## 2019-01-18 LAB — POTASSIUM: Potassium: 5.1 mmol/L (ref 3.5–5.1)

## 2019-01-18 NOTE — Progress Notes (Signed)
Pulmonary Critical Care Medicine [place]   PULMONARY CRITICAL CARE SERVICE  PROGRESS NOTE  Date of Service: 01/18/2019  MARISA HUFSTETLER  KVQ:[id number]  DOB: [birth year]   DOA: 12/23/2018  Referring Physician: Merton Border, MD  HPI: Raymond Avila is a 53 y.o. male seen for follow up of Acute on Chronic Respiratory Failure.  Patient currently is on T collar without distress at this time.  Has been on 28% FiO2  Medications: Reviewed on Rounds  Physical Exam:  Vitals: Temperature 98.0 pulse 85 respiratory rate 24 blood pressure 164/88 saturations 99%  Ventilator Settings off the ventilator right now on T collar  . General: Comfortable at this time . Eyes: Grossly normal lids, irises & conjunctiva . ENT: grossly tongue is normal . Neck: no obvious mass . Cardiovascular: S1 S2 normal no gallop . Respiratory: No rhonchi no rales are noted at this time . Abdomen: soft . Skin: no rash seen on limited exam . Musculoskeletal: not rigid . Psychiatric:unable to assess . Neurologic: no seizure no involuntary movements         Lab Data:   Basic Metabolic Panel: Recent Labs  Lab 01/12/19 0630 01/14/19 1033 01/15/19 1208 01/16/19 1058 01/17/19 1546 01/18/19 0643  NA 132* 132* 134* 137 149* 143  K 4.2 4.6 3.3* 3.4* 3.4* 5.5*  CL 88* 89* 94* 99 105 101  CO2 33* 29 28 30  32 29  GLUCOSE 131* 125* 128* 164* 142* 170*  BUN 21* 54* 79* 69* 29* 30*  CREATININE <0.30* 1.16 0.91 0.48* <0.30* 0.44*  CALCIUM 9.1 9.4 8.8* 8.8* 8.1* 9.5  MG 2.0 2.3  --  2.7*  --   --     ABG: Recent Labs  Lab 01/14/19 1430  PHART 7.464*  PCO2ART 44.4  PO2ART 93.0  HCO3 30.7*  O2SAT 96.5    Liver Function Tests: Recent Labs  Lab 01/14/19 1033  AST 40  ALT 61*  ALKPHOS 89  BILITOT 0.5  PROT 6.7  ALBUMIN 2.3*   No results for input(s): LIPASE, AMYLASE in the last 168 hours. No results for input(s): AMMONIA in the last 168 hours.  CBC: Recent Labs  Lab  01/12/19 0630 01/14/19 1033 01/15/19 1208 01/16/19 1058 01/17/19 1546  WBC 11.2* 18.8* 16.3* 11.9* 7.9  HGB 8.6* 9.8* 7.5* 7.1* 7.4*  HCT 27.0* 31.2* 23.5* 23.4* 23.1*  MCV 91.2 91.0 91.1 92.9 92.4  PLT 435* 398 317 331 285    Cardiac Enzymes: No results for input(s): CKTOTAL, CKMB, CKMBINDEX, TROPONINI in the last 168 hours.  BNP (last 3 results) No results for input(s): BNP in the last 8760 hours.  ProBNP (last 3 results) No results for input(s): PROBNP in the last 8760 hours.  Radiological Exams: No results found.  Assessment/Plan Active Problems:   Acute on chronic respiratory failure with hypoxia (HCC)   Lobar pneumonia, unspecified organism (HCC)   Chronic congestive heart failure (HCC)   Closed C7 fracture without spinal cord injury, sequela   Pulmonary embolism and infarction ([place])   1. Acute on chronic respiratory failure hypoxia plan is to continue with T collar will change the trach out to a #6 cuffless trach and will try capping 2. Lobar pneumonia treated clinically is improving we will continue with supportive care 3. Chronic congestive heart failure compensated 4. C7 fracture repaired 5. Pulmonary embolism treated we will continue with supportive care   I have personally seen and evaluated the patient, evaluated laboratory and imaging results, formulated the  assessment and plan and placed orders. The Patient requires high complexity decision making for assessment and support.  Case was discussed on Rounds with the Respiratory Therapy Staff  Allyne Gee, MD [facility] Pulmonary Critical Care Medicine Sleep Medicine

## 2019-01-19 ENCOUNTER — Other Ambulatory Visit (HOSPITAL_COMMUNITY): Payer: Medicare Other

## 2019-01-19 DIAGNOSIS — J9621 Acute and chronic respiratory failure with hypoxia: Secondary | ICD-10-CM | POA: Diagnosis not present

## 2019-01-19 DIAGNOSIS — S12690S Other displaced fracture of seventh cervical vertebra, sequela: Secondary | ICD-10-CM | POA: Diagnosis not present

## 2019-01-19 DIAGNOSIS — I509 Heart failure, unspecified: Secondary | ICD-10-CM | POA: Diagnosis not present

## 2019-01-19 DIAGNOSIS — J181 Lobar pneumonia, unspecified organism: Secondary | ICD-10-CM | POA: Diagnosis not present

## 2019-01-19 LAB — BASIC METABOLIC PANEL
Anion gap: 9 (ref 5–15)
BUN: 20 mg/dL (ref 6–20)
CO2: 37 mmol/L — ABNORMAL HIGH (ref 22–32)
Calcium: 9.5 mg/dL (ref 8.9–10.3)
Chloride: 96 mmol/L — ABNORMAL LOW (ref 98–111)
Creatinine, Ser: <0.3 mg/dL — ABNORMAL LOW (ref 0.61–1.24)
Glucose, Bld: 156 mg/dL — ABNORMAL HIGH (ref 70–99)
Potassium: 3.9 mmol/L (ref 3.5–5.1)
Sodium: 142 mmol/L (ref 135–145)

## 2019-01-19 LAB — CULTURE, BLOOD (ROUTINE X 2)
Culture: NO GROWTH
Culture: NO GROWTH
Special Requests: ADEQUATE
Special Requests: ADEQUATE

## 2019-01-19 LAB — CBC
HCT: 27.5 % — ABNORMAL LOW (ref 39.0–52.0)
Hemoglobin: 8.4 g/dL — ABNORMAL LOW (ref 13.0–17.0)
MCH: 28.5 pg (ref 26.0–34.0)
MCHC: 30.5 g/dL (ref 30.0–36.0)
MCV: 93.2 fL (ref 80.0–100.0)
Platelets: 320 10*3/uL (ref 150–400)
RBC: 2.95 MIL/uL — ABNORMAL LOW (ref 4.22–5.81)
RDW: 14.4 % (ref 11.5–15.5)
WBC: 9.4 10*3/uL (ref 4.0–10.5)
nRBC: 0 % (ref 0.0–0.2)

## 2019-01-19 LAB — MAGNESIUM: Magnesium: 1.7 mg/dL (ref 1.7–2.4)

## 2019-01-19 LAB — PHOSPHORUS: Phosphorus: 3.5 mg/dL (ref 2.5–4.6)

## 2019-01-19 NOTE — Progress Notes (Signed)
Pulmonary Critical Care Medicine [place]   PULMONARY CRITICAL CARE SERVICE  PROGRESS NOTE  Date of Service: 01/19/2019  Raymond Avila  [id number]:[id number]  DOB: April 16, 1965   DOA: 12/23/2018  Referring Physician: Merton Border, MD  HPI: Raymond Avila is a 53 y.o. male seen for follow up of Acute on Chronic Respiratory Failure.  Patient currently is capping has been capping for more than 24 hours is on 1 L oxygen no significant desaturations are noted  Medications: Reviewed on Rounds  Physical Exam:  Vitals: Temperature 97.4 pulse 69 respiratory 18 blood pressure 157/89 saturations 100%  Ventilator Settings capping right now for 24 hours  . General: Comfortable at this time . Eyes: Grossly normal lids, irises & conjunctiva . ENT: grossly tongue is normal . Neck: no obvious mass . Cardiovascular: S1 S2 normal no gallop . Respiratory: No rhonchi no rales are noted at this time . Abdomen: soft . Skin: no rash seen on limited exam . Musculoskeletal: not rigid . Psychiatric:unable to assess . Neurologic: no seizure no involuntary movements         Lab Data:   Basic Metabolic Panel: Recent Labs  Lab 01/14/19 1033 01/15/19 1208 01/16/19 1058 01/17/19 1546 01/18/19 0643 01/18/19 1022 01/19/19 0603  NA 132* 134* 137 149* 143  --  142  K 4.6 3.3* 3.4* 3.4* 5.5* 5.1 3.9  CL 89* 94* 99 105 101  --  96*  CO2 29 28 30  32 29  --  37*  GLUCOSE 125* 128* 164* 142* 170*  --  156*  BUN 54* 79* 69* 29* 30*  --  20  CREATININE 1.16 0.91 0.48* <0.30* 0.44*  --  <0.30*  CALCIUM 9.4 8.8* 8.8* 8.1* 9.5  --  9.5  MG 2.3  --  2.7*  --   --   --  1.7  PHOS  --   --   --   --   --   --  3.5    ABG: Recent Labs  Lab 01/14/19 1430  PHART 7.464*  PCO2ART 44.4  PO2ART 93.0  HCO3 30.7*  O2SAT 96.5    Liver Function Tests: Recent Labs  Lab 01/14/19 1033  AST 40  ALT 61*  ALKPHOS 89  BILITOT 0.5  PROT 6.7  ALBUMIN 2.3*   No results for input(s): LIPASE,  AMYLASE in the last 168 hours. No results for input(s): AMMONIA in the last 168 hours.  CBC: Recent Labs  Lab 01/14/19 1033 01/15/19 1208 01/16/19 1058 01/17/19 1546 01/19/19 0603  WBC 18.8* 16.3* 11.9* 7.9 9.4  HGB 9.8* 7.5* 7.1* 7.4* 8.4*  HCT 31.2* 23.5* 23.4* 23.1* 27.5*  MCV 91.0 91.1 92.9 92.4 93.2  PLT 398 317 331 285 320    Cardiac Enzymes: No results for input(s): CKTOTAL, CKMB, CKMBINDEX, TROPONINI in the last 168 hours.  BNP (last 3 results) No results for input(s): BNP in the last 8760 hours.  ProBNP (last 3 results) No results for input(s): PROBNP in the last 8760 hours.  Radiological Exams: No results found.  Assessment/Plan Active Problems:   Acute on chronic respiratory failure with hypoxia (HCC)   Lobar pneumonia, unspecified organism (HCC)   Chronic congestive heart failure (HCC)   Closed C7 fracture without spinal cord injury, sequela   Pulmonary embolism and infarction ([place])   1. Acute on chronic respiratory failure hypoxia plan is to continue with capping hopefully we may be looking at the potential for decannulation 2. Lobar pneumonia treated improving  3. Chronic congestive heart failure compensated 4. C7 fracture stable 5. Pulmonary embolism treated   I have personally seen and evaluated the patient, evaluated laboratory and imaging results, formulated the assessment and plan and placed orders. The Patient requires high complexity decision making for assessment and support.  Case was discussed on Rounds with the Respiratory Therapy Staff  Yevonne Pax, MD [facility] Pulmonary Critical Care Medicine Sleep Medicine

## 2019-01-20 DIAGNOSIS — I509 Heart failure, unspecified: Secondary | ICD-10-CM | POA: Diagnosis not present

## 2019-01-20 DIAGNOSIS — S12690S Other displaced fracture of seventh cervical vertebra, sequela: Secondary | ICD-10-CM | POA: Diagnosis not present

## 2019-01-20 DIAGNOSIS — J9621 Acute and chronic respiratory failure with hypoxia: Secondary | ICD-10-CM | POA: Diagnosis not present

## 2019-01-20 DIAGNOSIS — J181 Lobar pneumonia, unspecified organism: Secondary | ICD-10-CM | POA: Diagnosis not present

## 2019-01-20 LAB — MAGNESIUM: Magnesium: 1.7 mg/dL (ref 1.7–2.4)

## 2019-01-20 NOTE — Progress Notes (Signed)
Pulmonary Critical Care Medicine [facility] [place]   PULMONARY CRITICAL CARE SERVICE  PROGRESS NOTE  Date of Service: 01/20/2019  Raymond Avila  KXF:[id number]  DOB: 11/07/1965   DOA: 12/23/2018  Referring Physician: [name], MD  HPI: Raymond Avila is a 53 y.o. male seen for follow up of Acute on Chronic Respiratory Failure.  Patient is capping has been capping now for about 48 hours.  Patient needs ongoing encouragement  Medications: Reviewed on Rounds  Physical Exam:  Vitals: Temperature 97.3 pulse 82 respiratory 21 blood pressure 157/85 saturations 97%  Ventilator Settings capping off the ventilator for 48 hours  . General: Comfortable at this time . Eyes: Grossly normal lids, irises & conjunctiva . ENT: grossly tongue is normal . Neck: no obvious mass . Cardiovascular: S1 S2 normal no gallop . Respiratory: No rhonchi no rales are noted at this time . Abdomen: soft . Skin: no rash seen on limited exam . Musculoskeletal: not rigid . Psychiatric:unable to assess . Neurologic: no seizure no involuntary movements         Lab Data:   Basic Metabolic Panel: Recent Labs  Lab 01/14/19 1033 01/15/19 1208 01/16/19 1058 01/17/19 1546 01/18/19 0643 01/18/19 1022 01/19/19 0603 01/20/19 0420  NA 132* 134* 137 149* 143  --  142  --   K 4.6 3.3* 3.4* 3.4* 5.5* 5.1 3.9  --   CL 89* 94* 99 105 101  --  96*  --   CO2 29 28 30  32 29  --  37*  --   GLUCOSE 125* 128* 164* 142* 170*  --  156*  --   BUN 54* 79* 69* 29* 30*  --  20  --   CREATININE 1.16 0.91 0.48* <0.30* 0.44*  --  <0.30*  --   CALCIUM 9.4 8.8* 8.8* 8.1* 9.5  --  9.5  --   MG 2.3  --  2.7*  --   --   --  1.7 1.7  PHOS  --   --   --   --   --   --  3.5  --     ABG: Recent Labs  Lab 01/14/19 1430  PHART 7.464*  PCO2ART 44.4  PO2ART 93.0  HCO3 30.7*  O2SAT 96.5    Liver Function Tests: Recent Labs  Lab 01/14/19 1033  AST 40  ALT 61*  ALKPHOS 89  BILITOT 0.5  PROT 6.7   ALBUMIN 2.3*   No results for input(s): LIPASE, AMYLASE in the last 168 hours. No results for input(s): AMMONIA in the last 168 hours.  CBC: Recent Labs  Lab 01/14/19 1033 01/15/19 1208 01/16/19 1058 01/17/19 1546 01/19/19 0603  WBC 18.8* 16.3* 11.9* 7.9 9.4  HGB 9.8* 7.5* 7.1* 7.4* 8.4*  HCT 31.2* 23.5* 23.4* 23.1* 27.5*  MCV 91.0 91.1 92.9 92.4 93.2  PLT 398 317 331 285 320    Cardiac Enzymes: No results for input(s): CKTOTAL, CKMB, CKMBINDEX, TROPONINI in the last 168 hours.  BNP (last 3 results) No results for input(s): BNP in the last 8760 hours.  ProBNP (last 3 results) No results for input(s): PROBNP in the last 8760 hours.  Radiological Exams: No results found.  Assessment/Plan Active Problems:   Acute on chronic respiratory failure with hypoxia (HCC)   Lobar pneumonia, unspecified organism (HCC)   Chronic congestive heart failure (HCC)   Closed C7 fracture without spinal cord injury, sequela   Pulmonary embolism and infarction (HCC)   1. Acute on  chronic respiratory failure hypoxia plan is to continue with the capping weans continue pulmonary toilet secretion management 2. Lobar pneumonia treated improving 3. Chronic congestive heart failure at baseline 4. C7 fracture no change 5. Pulmonary embolism treated   I have personally seen and evaluated the patient, evaluated laboratory and imaging results, formulated the assessment and plan and placed orders. The Patient requires high complexity decision making for assessment and support.  Case was discussed on Rounds with the Respiratory Therapy Staff  Allyne Gee, MD [facility] Pulmonary Critical Care Medicine Sleep Medicine

## 2019-01-21 DIAGNOSIS — J9621 Acute and chronic respiratory failure with hypoxia: Secondary | ICD-10-CM | POA: Diagnosis not present

## 2019-01-21 DIAGNOSIS — S12690S Other displaced fracture of seventh cervical vertebra, sequela: Secondary | ICD-10-CM | POA: Diagnosis not present

## 2019-01-21 DIAGNOSIS — J181 Lobar pneumonia, unspecified organism: Secondary | ICD-10-CM | POA: Diagnosis not present

## 2019-01-21 DIAGNOSIS — I509 Heart failure, unspecified: Secondary | ICD-10-CM | POA: Diagnosis not present

## 2019-01-21 LAB — BASIC METABOLIC PANEL
Anion gap: 9 (ref 5–15)
BUN: 13 mg/dL (ref 6–20)
CO2: 33 mmol/L — ABNORMAL HIGH (ref 22–32)
Calcium: 9.3 mg/dL (ref 8.9–10.3)
Chloride: 93 mmol/L — ABNORMAL LOW (ref 98–111)
Creatinine, Ser: <0.3 mg/dL — ABNORMAL LOW (ref 0.61–1.24)
Glucose, Bld: 128 mg/dL — ABNORMAL HIGH (ref 70–99)
Potassium: 4.3 mmol/L (ref 3.5–5.1)
Sodium: 135 mmol/L (ref 135–145)

## 2019-01-21 LAB — CBC
HCT: 29 % — ABNORMAL LOW (ref 39.0–52.0)
Hemoglobin: 9 g/dL — ABNORMAL LOW (ref 13.0–17.0)
MCH: 28.4 pg (ref 26.0–34.0)
MCHC: 31 g/dL (ref 30.0–36.0)
MCV: 91.5 fL (ref 80.0–100.0)
Platelets: 334 10*3/uL (ref 150–400)
RBC: 3.17 MIL/uL — ABNORMAL LOW (ref 4.22–5.81)
RDW: 15.1 % (ref 11.5–15.5)
WBC: 10.7 10*3/uL — ABNORMAL HIGH (ref 4.0–10.5)
nRBC: 0 % (ref 0.0–0.2)

## 2019-01-21 LAB — PHOSPHORUS: Phosphorus: 4 mg/dL (ref 2.5–4.6)

## 2019-01-21 LAB — MAGNESIUM: Magnesium: 1.7 mg/dL (ref 1.7–2.4)

## 2019-01-21 NOTE — Progress Notes (Signed)
Pulmonary Critical Care Medicine [place]   PULMONARY CRITICAL CARE SERVICE  PROGRESS NOTE  Date of Service: 01/21/2019  Raymond Avila  [id number]:[id number]  DOB: 1965/04/03   DOA: 12/23/2018  Referring Physician: Merton Border, MD  HPI: Raymond Avila is a 53 y.o. male seen for follow up of Acute on Chronic Respiratory Failure.  Patient is doing well capping has been on room air is capping for 72 hours.  Still some issues with a weak cough which we are working on with the Acapella device  Medications: Reviewed on Rounds  Physical Exam:  Vitals: Temperature 98.0 pulse 77 respiratory 14 blood pressure 153/87 saturations 98%  Ventilator Settings capping on room air  . General: Comfortable at this time . Eyes: Grossly normal lids, irises & conjunctiva . ENT: grossly tongue is normal . Neck: no obvious mass . Cardiovascular: S1 S2 normal no gallop . Respiratory: No rhonchi no rales are noted at this time . Abdomen: soft . Skin: no rash seen on limited exam . Musculoskeletal: not rigid . Psychiatric:unable to assess . Neurologic: no seizure no involuntary movements         Lab Data:   Basic Metabolic Panel: Recent Labs  Lab 01/16/19 1058 01/17/19 1546 01/18/19 0643 01/18/19 1022 01/19/19 0603 01/20/19 0420 01/21/19 0526  NA 137 149* 143  --  142  --  135  K 3.4* 3.4* 5.5* 5.1 3.9  --  4.3  CL 99 105 101  --  96*  --  93*  CO2 30 32 29  --  37*  --  33*  GLUCOSE 164* 142* 170*  --  156*  --  128*  BUN 69* 29* 30*  --  20  --  13  CREATININE 0.48* <0.30* 0.44*  --  <0.30*  --  <0.30*  CALCIUM 8.8* 8.1* 9.5  --  9.5  --  9.3  MG 2.7*  --   --   --  1.7 1.7 1.7  PHOS  --   --   --   --  3.5  --  4.0    ABG: No results for input(s): PHART, PCO2ART, PO2ART, HCO3, O2SAT in the last 168 hours.  Liver Function Tests: No results for input(s): AST, ALT, ALKPHOS, BILITOT, PROT, ALBUMIN in the last 168 hours. No results for input(s): LIPASE, AMYLASE in  the last 168 hours. No results for input(s): AMMONIA in the last 168 hours.  CBC: Recent Labs  Lab 01/15/19 1208 01/16/19 1058 01/17/19 1546 01/19/19 0603 01/21/19 0526  WBC 16.3* 11.9* 7.9 9.4 10.7*  HGB 7.5* 7.1* 7.4* 8.4* 9.0*  HCT 23.5* 23.4* 23.1* 27.5* 29.0*  MCV 91.1 92.9 92.4 93.2 91.5  PLT 317 331 285 320 334    Cardiac Enzymes: No results for input(s): CKTOTAL, CKMB, CKMBINDEX, TROPONINI in the last 168 hours.  BNP (last 3 results) No results for input(s): BNP in the last 8760 hours.  ProBNP (last 3 results) No results for input(s): PROBNP in the last 8760 hours.  Radiological Exams: No results found.  Assessment/Plan Active Problems:   Acute on chronic respiratory failure with hypoxia (HCC)   Lobar pneumonia, unspecified organism (HCC)   Chronic congestive heart failure (HCC)   Closed C7 fracture without spinal cord injury, sequela   Pulmonary embolism and infarction ([place])   1. Acute on chronic respiratory failure hypoxia plan is to continue with capping on room air continue encouraging coughing 2. Lobar pneumonia treated we will continue with supportive  care treated clinically is resolved 3. Chronic congestive heart failure compensated 4. C7 fracture continue present management 5. Pulmonary embolism treated   I have personally seen and evaluated the patient, evaluated laboratory and imaging results, formulated the assessment and plan and placed orders. The Patient requires high complexity decision making for assessment and support.  Case was discussed on Rounds with the Respiratory Therapy Staff  Yevonne Pax, MD [facility] Pulmonary Critical Care Medicine Sleep Medicine

## 2019-01-22 DIAGNOSIS — I509 Heart failure, unspecified: Secondary | ICD-10-CM | POA: Diagnosis not present

## 2019-01-22 DIAGNOSIS — J9621 Acute and chronic respiratory failure with hypoxia: Secondary | ICD-10-CM | POA: Diagnosis not present

## 2019-01-22 DIAGNOSIS — J181 Lobar pneumonia, unspecified organism: Secondary | ICD-10-CM | POA: Diagnosis not present

## 2019-01-22 DIAGNOSIS — S12690S Other displaced fracture of seventh cervical vertebra, sequela: Secondary | ICD-10-CM | POA: Diagnosis not present

## 2019-01-22 LAB — MAGNESIUM: Magnesium: 1.5 mg/dL — ABNORMAL LOW (ref 1.7–2.4)

## 2019-01-22 NOTE — Progress Notes (Signed)
Pulmonary Critical Care Medicine [place]   PULMONARY CRITICAL CARE SERVICE  PROGRESS NOTE  Date of Service: 01/22/2019  Raymond Avila  [id number]:[id number]  DOB: Mar 11, 1965   DOA: 12/23/2018  Referring Physician: Merton Border, MD  HPI: Raymond Avila is a 53 y.o. male seen for follow up of Acute on Chronic Respiratory Failure.  Patient currently is capping doing well we should be doing the Acapella device  Medications: Reviewed on Rounds  Physical Exam:  Vitals: Temperature 97.5 pulse 84 respiratory rate 16 blood pressure is 143/71 saturations 95%  Ventilator Settings capping off the ventilator at this time  . General: Comfortable at this time . Eyes: Grossly normal lids, irises & conjunctiva . ENT: grossly tongue is normal . Neck: no obvious mass . Cardiovascular: S1 S2 normal no gallop . Respiratory: No rhonchi no rales are noted . Abdomen: soft . Skin: no rash seen on limited exam . Musculoskeletal: not rigid . Psychiatric:unable to assess . Neurologic: no seizure no involuntary movements         Lab Data:   Basic Metabolic Panel: Recent Labs  Lab 01/16/19 1058 01/17/19 1546 01/18/19 0643 01/18/19 1022 01/19/19 0603 01/20/19 0420 01/21/19 0526  NA 137 149* 143  --  142  --  135  K 3.4* 3.4* 5.5* 5.1 3.9  --  4.3  CL 99 105 101  --  96*  --  93*  CO2 30 32 29  --  37*  --  33*  GLUCOSE 164* 142* 170*  --  156*  --  128*  BUN 69* 29* 30*  --  20  --  13  CREATININE 0.48* <0.30* 0.44*  --  <0.30*  --  <0.30*  CALCIUM 8.8* 8.1* 9.5  --  9.5  --  9.3  MG 2.7*  --   --   --  1.7 1.7 1.7  PHOS  --   --   --   --  3.5  --  4.0    ABG: No results for input(s): PHART, PCO2ART, PO2ART, HCO3, O2SAT in the last 168 hours.  Liver Function Tests: No results for input(s): AST, ALT, ALKPHOS, BILITOT, PROT, ALBUMIN in the last 168 hours. No results for input(s): LIPASE, AMYLASE in the last 168 hours. No results for input(s): AMMONIA in the last  168 hours.  CBC: Recent Labs  Lab 01/15/19 1208 01/16/19 1058 01/17/19 1546 01/19/19 0603 01/21/19 0526  WBC 16.3* 11.9* 7.9 9.4 10.7*  HGB 7.5* 7.1* 7.4* 8.4* 9.0*  HCT 23.5* 23.4* 23.1* 27.5* 29.0*  MCV 91.1 92.9 92.4 93.2 91.5  PLT 317 331 285 320 334    Cardiac Enzymes: No results for input(s): CKTOTAL, CKMB, CKMBINDEX, TROPONINI in the last 168 hours.  BNP (last 3 results) No results for input(s): BNP in the last 8760 hours.  ProBNP (last 3 results) No results for input(s): PROBNP in the last 8760 hours.  Radiological Exams: No results found.  Assessment/Plan Active Problems:   Acute on chronic respiratory failure with hypoxia (HCC)   Lobar pneumonia, unspecified organism (HCC)   Chronic congestive heart failure (HCC)   Closed C7 fracture without spinal cord injury, sequela   Pulmonary embolism and infarction ([place])   1. Acute on chronic respiratory failure with hypoxia plan is to continue with Acapella continue aggressive pulmonary toilet supportive care 2. Lobar pneumonia treated we will continue to follow 3. Chronic congestive heart failure continue with present management appears to be compensated 4. C7 fracture  status post. 5. Pulmonary embolism treated   I have personally seen and evaluated the patient, evaluated laboratory and imaging results, formulated the assessment and plan and placed orders. The Patient requires high complexity decision making for assessment and support.  Case was discussed on Rounds with the Respiratory Therapy Staff  Yevonne Pax, MD [facility] Pulmonary Critical Care Medicine Sleep Medicine

## 2019-01-23 DIAGNOSIS — J181 Lobar pneumonia, unspecified organism: Secondary | ICD-10-CM | POA: Diagnosis not present

## 2019-01-23 DIAGNOSIS — I509 Heart failure, unspecified: Secondary | ICD-10-CM | POA: Diagnosis not present

## 2019-01-23 DIAGNOSIS — J9621 Acute and chronic respiratory failure with hypoxia: Secondary | ICD-10-CM | POA: Diagnosis not present

## 2019-01-23 DIAGNOSIS — S12690S Other displaced fracture of seventh cervical vertebra, sequela: Secondary | ICD-10-CM | POA: Diagnosis not present

## 2019-01-23 LAB — CBC
HCT: 28.7 % — ABNORMAL LOW (ref 39.0–52.0)
Hemoglobin: 8.8 g/dL — ABNORMAL LOW (ref 13.0–17.0)
MCH: 28.2 pg (ref 26.0–34.0)
MCHC: 30.7 g/dL (ref 30.0–36.0)
MCV: 92 fL (ref 80.0–100.0)
Platelets: 305 10*3/uL (ref 150–400)
RBC: 3.12 MIL/uL — ABNORMAL LOW (ref 4.22–5.81)
RDW: 15.8 % — ABNORMAL HIGH (ref 11.5–15.5)
WBC: 11.4 10*3/uL — ABNORMAL HIGH (ref 4.0–10.5)
nRBC: 0 % (ref 0.0–0.2)

## 2019-01-23 LAB — BASIC METABOLIC PANEL
Anion gap: 10 (ref 5–15)
BUN: 13 mg/dL (ref 6–20)
CO2: 26 mmol/L (ref 22–32)
Calcium: 9.3 mg/dL (ref 8.9–10.3)
Chloride: 94 mmol/L — ABNORMAL LOW (ref 98–111)
Creatinine, Ser: <0.3 mg/dL — ABNORMAL LOW (ref 0.61–1.24)
Glucose, Bld: 108 mg/dL — ABNORMAL HIGH (ref 70–99)
Potassium: 4.7 mmol/L (ref 3.5–5.1)
Sodium: 130 mmol/L — ABNORMAL LOW (ref 135–145)

## 2019-01-23 LAB — ALBUMIN: Albumin: 2 g/dL — ABNORMAL LOW (ref 3.5–5.0)

## 2019-01-23 LAB — MAGNESIUM: Magnesium: 2 mg/dL (ref 1.7–2.4)

## 2019-01-23 LAB — PHOSPHORUS: Phosphorus: 4.5 mg/dL (ref 2.5–4.6)

## 2019-01-23 NOTE — Progress Notes (Signed)
Pulmonary Critical Care Medicine [facility] [place]   PULMONARY CRITICAL CARE SERVICE  PROGRESS NOTE  Date of Service: 01/23/2019  Raymond Avila  [id number]:[id number]  DOB: 01-20-1966   DOA: 12/23/2018  Referring Physician: [name], MD  HPI: Raymond Avila is a 53 y.o. male seen for follow up of Acute on Chronic Respiratory Failure.  Patient currently is capping has been on room air.  To be comfortable  Medications: Reviewed on Rounds  Physical Exam:  Vitals: Temperature 97.3 pulse 73 respiratory rate 15 blood pressure 109/63 saturations 95%  Ventilator Settings currently capping on room air  . General: Comfortable at this time . Eyes: Grossly normal lids, irises & conjunctiva . ENT: grossly tongue is normal . Neck: no obvious mass . Cardiovascular: S1 S2 normal no gallop . Respiratory: No rhonchi no rales are noted at this time . Abdomen: soft . Skin: no rash seen on limited exam . Musculoskeletal: not rigid . Psychiatric:unable to assess . Neurologic: no seizure no involuntary movements         Lab Data:   Basic Metabolic Panel: Recent Labs  Lab 01/17/19 1546 01/18/19 0643 01/18/19 1022 01/19/19 0603 01/20/19 0420 01/21/19 0526 01/22/19 1258 01/23/19 0445  NA 149* 143  --  142  --  135  --  130*  K 3.4* 5.5* 5.1 3.9  --  4.3  --  4.7  CL 105 101  --  96*  --  93*  --  94*  CO2 32 29  --  37*  --  33*  --  26  GLUCOSE 142* 170*  --  156*  --  128*  --  108*  BUN 29* 30*  --  20  --  13  --  13  CREATININE <0.30* 0.44*  --  <0.30*  --  <0.30*  --  <0.30*  CALCIUM 8.1* 9.5  --  9.5  --  9.3  --  9.3  MG  --   --   --  1.7 1.7 1.7 1.5* 2.0  PHOS  --   --   --  3.5  --  4.0  --  4.5    ABG: No results for input(s): PHART, PCO2ART, PO2ART, HCO3, O2SAT in the last 168 hours.  Liver Function Tests: Recent Labs  Lab 01/23/19 0445  ALBUMIN 2.0*   No results for input(s): LIPASE, AMYLASE in the last 168 hours. No results for input(s): AMMONIA  in the last 168 hours.  CBC: Recent Labs  Lab 01/17/19 1546 01/19/19 0603 01/21/19 0526 01/23/19 0445  WBC 7.9 9.4 10.7* 11.4*  HGB 7.4* 8.4* 9.0* 8.8*  HCT 23.1* 27.5* 29.0* 28.7*  MCV 92.4 93.2 91.5 92.0  PLT 285 320 334 305    Cardiac Enzymes: No results for input(s): CKTOTAL, CKMB, CKMBINDEX, TROPONINI in the last 168 hours.  BNP (last 3 results) No results for input(s): BNP in the last 8760 hours.  ProBNP (last 3 results) No results for input(s): PROBNP in the last 8760 hours.  Radiological Exams: No results found.  Assessment/Plan Active Problems:   Acute on chronic respiratory failure with hypoxia (HCC)   Lobar pneumonia, unspecified organism (HCC)   Chronic congestive heart failure (HCC)   Closed C7 fracture without spinal cord injury, sequela   Pulmonary embolism and infarction (HCC)   1. Acute on chronic respiratory failure with hypoxia plan is to continue with capping process he needs to actively coughing try to move up his secretions he has  to be coached by respiratory therapy to do this 2. Lobar pneumonia no changes we will continue with supportive care 3. Chronic congestive heart failure appears to be compensated 4. C7 fracture status post repair 5. Pulmonary embolism treated   I have personally seen and evaluated the patient, evaluated laboratory and imaging results, formulated the assessment and plan and placed orders. The Patient requires high complexity decision making for assessment and support.  Case was discussed on Rounds with the Respiratory Therapy Staff  Allyne Gee, MD [facility] Pulmonary Critical Care Medicine Sleep Medicine

## 2019-01-24 DIAGNOSIS — J181 Lobar pneumonia, unspecified organism: Secondary | ICD-10-CM | POA: Diagnosis not present

## 2019-01-24 DIAGNOSIS — S12690S Other displaced fracture of seventh cervical vertebra, sequela: Secondary | ICD-10-CM | POA: Diagnosis not present

## 2019-01-24 DIAGNOSIS — I509 Heart failure, unspecified: Secondary | ICD-10-CM | POA: Diagnosis not present

## 2019-01-24 DIAGNOSIS — J9621 Acute and chronic respiratory failure with hypoxia: Secondary | ICD-10-CM | POA: Diagnosis not present

## 2019-01-24 LAB — BASIC METABOLIC PANEL
Anion gap: 12 (ref 5–15)
BUN: 15 mg/dL (ref 6–20)
CO2: 24 mmol/L (ref 22–32)
Calcium: 9.5 mg/dL (ref 8.9–10.3)
Chloride: 94 mmol/L — ABNORMAL LOW (ref 98–111)
Creatinine, Ser: 0.47 mg/dL — ABNORMAL LOW (ref 0.61–1.24)
GFR calc Af Amer: >60 mL/min (ref >60)
GFR calc non Af Amer: >60 mL/min (ref >60)
Glucose, Bld: 128 mg/dL — ABNORMAL HIGH (ref 70–99)
Potassium: 3.9 mmol/L (ref 3.5–5.1)
Sodium: 130 mmol/L — ABNORMAL LOW (ref 135–145)

## 2019-01-24 NOTE — Progress Notes (Signed)
Pulmonary Critical Care Medicine [facility] [place]   PULMONARY CRITICAL CARE SERVICE  PROGRESS NOTE  Date of Service: 01/24/2019  Raymond Avila  LHT:[id number]  DOB: April 23, 1965   DOA: 12/23/2018  Referring Physician: [name], MD  HPI: Raymond Avila is a 53 y.o. male seen for follow up of Acute on Chronic Respiratory Failure.  Patient currently is capping has been on room air looks good he has been doing better as far as secretions are concerned.  Also he does have improvement in his upper extremity movement  Medications: Reviewed on Rounds  Physical Exam:  Vitals: Temperature 97.9 pulse 100 respiratory 27 blood pressure 96/84 saturations 95%  Ventilator Settings capping on room air at this time  . General: Comfortable at this time . Eyes: Grossly normal lids, irises & conjunctiva . ENT: grossly tongue is normal . Neck: no obvious mass . Cardiovascular: S1 S2 normal no gallop . Respiratory: Scattered rhonchi distant . Abdomen: soft . Skin: no rash seen on limited exam . Musculoskeletal: not rigid . Psychiatric:unable to assess . Neurologic: no seizure no involuntary movements         Lab Data:   Basic Metabolic Panel: Recent Labs  Lab 01/18/19 0643 01/18/19 1022 01/19/19 0603 01/20/19 0420 01/21/19 0526 01/22/19 1258 01/23/19 0445 01/24/19 0532  NA 143  --  142  --  135  --  130* 130*  K 5.5* 5.1 3.9  --  4.3  --  4.7 3.9  CL 101  --  96*  --  93*  --  94* 94*  CO2 29  --  37*  --  33*  --  26 24  GLUCOSE 170*  --  156*  --  128*  --  108* 128*  BUN 30*  --  20  --  13  --  13 15  CREATININE 0.44*  --  <0.30*  --  <0.30*  --  <0.30* 0.47*  CALCIUM 9.5  --  9.5  --  9.3  --  9.3 9.5  MG  --   --  1.7 1.7 1.7 1.5* 2.0  --   PHOS  --   --  3.5  --  4.0  --  4.5  --     ABG: No results for input(s): PHART, PCO2ART, PO2ART, HCO3, O2SAT in the last 168 hours.  Liver Function Tests: Recent Labs  Lab 01/23/19 0445  ALBUMIN 2.0*   No  results for input(s): LIPASE, AMYLASE in the last 168 hours. No results for input(s): AMMONIA in the last 168 hours.  CBC: Recent Labs  Lab 01/17/19 1546 01/19/19 0603 01/21/19 0526 01/23/19 0445  WBC 7.9 9.4 10.7* 11.4*  HGB 7.4* 8.4* 9.0* 8.8*  HCT 23.1* 27.5* 29.0* 28.7*  MCV 92.4 93.2 91.5 92.0  PLT 285 320 334 305    Cardiac Enzymes: No results for input(s): CKTOTAL, CKMB, CKMBINDEX, TROPONINI in the last 168 hours.  BNP (last 3 results) No results for input(s): BNP in the last 8760 hours.  ProBNP (last 3 results) No results for input(s): PROBNP in the last 8760 hours.  Radiological Exams: No results found.  Assessment/Plan Active Problems:   Acute on chronic respiratory failure with hypoxia (HCC)   Lobar pneumonia, unspecified organism (HCC)   Chronic congestive heart failure (HCC)   Closed C7 fracture without spinal cord injury, sequela   Pulmonary embolism and infarction (HCC)   1. Acute on chronic respiratory failure with hypoxia patient currently is capping on  room air needs ongoing encouragement for coughing and bringing up his secretions.  He does have improvement neurologically also in his upper extremities 2. Lobar pneumonia treated clinically is improving 3. Chronic congestive heart failure compensated 4. C7 fracture improving upper extremity movement though still weak will benefit from neuro rehab 5. Pulmonary embolism treated   I have personally seen and evaluated the patient, evaluated laboratory and imaging results, formulated the assessment and plan and placed orders. The Patient requires high complexity decision making for assessment and support.  Case was discussed on Rounds with the Respiratory Therapy Staff  Allyne Gee, MD [facility] Pulmonary Critical Care Medicine Sleep Medicine

## 2019-01-25 ENCOUNTER — Other Ambulatory Visit (HOSPITAL_COMMUNITY): Payer: Medicare Other

## 2019-01-25 DIAGNOSIS — J181 Lobar pneumonia, unspecified organism: Secondary | ICD-10-CM | POA: Diagnosis not present

## 2019-01-25 DIAGNOSIS — J9621 Acute and chronic respiratory failure with hypoxia: Secondary | ICD-10-CM | POA: Diagnosis not present

## 2019-01-25 DIAGNOSIS — I509 Heart failure, unspecified: Secondary | ICD-10-CM | POA: Diagnosis not present

## 2019-01-25 DIAGNOSIS — S12690S Other displaced fracture of seventh cervical vertebra, sequela: Secondary | ICD-10-CM | POA: Diagnosis not present

## 2019-01-25 LAB — BASIC METABOLIC PANEL
Anion gap: 8 (ref 5–15)
BUN: 15 mg/dL (ref 6–20)
CO2: 29 mmol/L (ref 22–32)
Calcium: 9.3 mg/dL (ref 8.9–10.3)
Chloride: 96 mmol/L — ABNORMAL LOW (ref 98–111)
Creatinine, Ser: <0.3 mg/dL — ABNORMAL LOW (ref 0.61–1.24)
Glucose, Bld: 150 mg/dL — ABNORMAL HIGH (ref 70–99)
Potassium: 3.9 mmol/L (ref 3.5–5.1)
Sodium: 133 mmol/L — ABNORMAL LOW (ref 135–145)

## 2019-01-25 LAB — PHOSPHORUS: Phosphorus: 4.1 mg/dL (ref 2.5–4.6)

## 2019-01-25 LAB — PROCALCITONIN: Procalcitonin: <0.1 ng/mL

## 2019-01-25 LAB — CBC
HCT: 27.2 % — ABNORMAL LOW (ref 39.0–52.0)
Hemoglobin: 8.5 g/dL — ABNORMAL LOW (ref 13.0–17.0)
MCH: 28.6 pg (ref 26.0–34.0)
MCHC: 31.3 g/dL (ref 30.0–36.0)
MCV: 91.6 fL (ref 80.0–100.0)
Platelets: 274 10*3/uL (ref 150–400)
RBC: 2.97 MIL/uL — ABNORMAL LOW (ref 4.22–5.81)
RDW: 16 % — ABNORMAL HIGH (ref 11.5–15.5)
WBC: 10.1 10*3/uL (ref 4.0–10.5)
nRBC: 0 % (ref 0.0–0.2)

## 2019-01-25 LAB — MAGNESIUM: Magnesium: 1.7 mg/dL (ref 1.7–2.4)

## 2019-01-25 NOTE — Progress Notes (Signed)
Pulmonary Critical Care Medicine [place]   PULMONARY CRITICAL CARE SERVICE  PROGRESS NOTE  Date of Service: 01/25/2019  Raymond Avila  [id number]:[id number]  DOB: 1965-05-31   DOA: 12/23/2018  Referring Physician: Merton Border, MD  HPI: Raymond Avila is a 53 y.o. male seen for follow up of Acute on Chronic Respiratory Failure.  Currently patient is capping and appears to be tolerating it well.  Has been on room air.  Appears that the cervical collar may need to stay in for longer period of time after discussion with the physical therapist  Medications: Reviewed on Rounds  Physical Exam:  Vitals: Temperature 97.9 pulse 77 respiratory 13 blood pressure 95/54 saturations 96%  Ventilator Settings off the ventilator capping currently  . General: Comfortable at this time . Eyes: Grossly normal lids, irises & conjunctiva . ENT: grossly tongue is normal . Neck: no obvious mass . Cardiovascular: S1 S2 normal no gallop . Respiratory: No rhonchi coarse breath sounds are noted at this time . Abdomen: soft . Skin: no rash seen on limited exam . Musculoskeletal: not rigid . Psychiatric:unable to assess . Neurologic: no seizure no involuntary movements         Lab Data:   Basic Metabolic Panel: Recent Labs  Lab 01/19/19 0603 01/20/19 0420 01/21/19 0526 01/22/19 1258 01/23/19 0445 01/24/19 0532 01/25/19 0747  NA 142  --  135  --  130* 130* 133*  K 3.9  --  4.3  --  4.7 3.9 3.9  CL 96*  --  93*  --  94* 94* 96*  CO2 37*  --  33*  --  26 24 29   GLUCOSE 156*  --  128*  --  108* 128* 150*  BUN 20  --  13  --  13 15 15   CREATININE <0.30*  --  <0.30*  --  <0.30* 0.47* <0.30*  CALCIUM 9.5  --  9.3  --  9.3 9.5 9.3  MG 1.7 1.7 1.7 1.5* 2.0  --  1.7  PHOS 3.5  --  4.0  --  4.5  --  4.1    ABG: No results for input(s): PHART, PCO2ART, PO2ART, HCO3, O2SAT in the last 168 hours.  Liver Function Tests: Recent Labs  Lab 01/23/19 0445  ALBUMIN 2.0*   No  results for input(s): LIPASE, AMYLASE in the last 168 hours. No results for input(s): AMMONIA in the last 168 hours.  CBC: Recent Labs  Lab 01/19/19 0603 01/21/19 0526 01/23/19 0445 01/25/19 0747  WBC 9.4 10.7* 11.4* 10.1  HGB 8.4* 9.0* 8.8* 8.5*  HCT 27.5* 29.0* 28.7* 27.2*  MCV 93.2 91.5 92.0 91.6  PLT 320 334 305 274    Cardiac Enzymes: No results for input(s): CKTOTAL, CKMB, CKMBINDEX, TROPONINI in the last 168 hours.  BNP (last 3 results) No results for input(s): BNP in the last 8760 hours.  ProBNP (last 3 results) No results for input(s): PROBNP in the last 8760 hours.  Radiological Exams: DG CHEST PORT 1 VIEW  Result Date: 01/25/2019 CLINICAL DATA:  Spinal cord injury.  Cough and pneumonia. EXAM: PORTABLE CHEST 1 VIEW COMPARISON:  01/16/2019 FINDINGS: Previous tracheostomy. Patchy infiltrates in the lower lobes left more than right have improved but not completely resolved. Small amount of pleural fluid on the left. No worsening or other new finding. IMPRESSION: Some improvement in patchy bilateral lower lobe pneumonia left more than right, but not completely resolved yet. Small amount of pleural fluid on the left.  Electronically Signed   By: Paulina Fusi M.D.   On: 01/25/2019 10:10    Assessment/Plan Active Problems:   Acute on chronic respiratory failure with hypoxia (HCC)   Lobar pneumonia, unspecified organism (HCC)   Chronic congestive heart failure (HCC)   Closed C7 fracture without spinal cord injury, sequela   Pulmonary embolism and infarction (HCC)   1. Acute on chronic respiratory failure hypoxia doing well with capping but probably should be able to proceed to decannulation this week 2. Lobar pneumonia treated improving chest x-ray was done 3. Chronic congestive heart failure follow-up chest x-ray ordered.  Results show small pleural effusion 4. C7 fracture physical therapy as tolerated 5. Pulmonary embolism treated we will continue with supportive  care   I have personally seen and evaluated the patient, evaluated laboratory and imaging results, formulated the assessment and plan and placed orders. The Patient requires high complexity decision making for assessment and support.  Case was discussed on Rounds with the Respiratory Therapy Staff  Yevonne Pax, MD [facility] Pulmonary Critical Care Medicine Sleep Medicine

## 2019-01-26 DIAGNOSIS — J181 Lobar pneumonia, unspecified organism: Secondary | ICD-10-CM | POA: Diagnosis not present

## 2019-01-26 DIAGNOSIS — S12690S Other displaced fracture of seventh cervical vertebra, sequela: Secondary | ICD-10-CM | POA: Diagnosis not present

## 2019-01-26 DIAGNOSIS — I509 Heart failure, unspecified: Secondary | ICD-10-CM | POA: Diagnosis not present

## 2019-01-26 DIAGNOSIS — J9621 Acute and chronic respiratory failure with hypoxia: Secondary | ICD-10-CM | POA: Diagnosis not present

## 2019-01-26 NOTE — Progress Notes (Signed)
Pulmonary Critical Care Medicine [facility] [place]   PULMONARY CRITICAL CARE SERVICE  PROGRESS NOTE  Date of Service: 01/26/2019  Raymond Avila  [id number]:[id number]  DOB: 18-Jan-1966   DOA: 12/23/2018  Referring Physician: [name], MD  HPI: Raymond Avila is a 53 y.o. male seen for follow up of Acute on Chronic Respiratory Failure.  He continues to do well with capping need to determine his discharge plan could likely be decannulated if he continues to show improvement as he is  Medications: Reviewed on Rounds  Physical Exam:  Vitals: Temperature 97.9 pulse 90 respiratory 19 blood pressure 118/67 saturations 98%  Ventilator Settings capping off the ventilator  . General: Comfortable at this time . Eyes: Grossly normal lids, irises & conjunctiva . ENT: grossly tongue is normal . Neck: no obvious mass . Cardiovascular: S1 S2 normal no gallop . Respiratory: No rhonchi no rales are noted at this time . Abdomen: soft . Skin: no rash seen on limited exam . Musculoskeletal: not rigid . Psychiatric:unable to assess . Neurologic: no seizure no involuntary movements         Lab Data:   Basic Metabolic Panel: Recent Labs  Lab 01/20/19 0420 01/21/19 0526 01/22/19 1258 01/23/19 0445 01/24/19 0532 01/25/19 0747  NA  --  135  --  130* 130* 133*  K  --  4.3  --  4.7 3.9 3.9  CL  --  93*  --  94* 94* 96*  CO2  --  33*  --  26 24 29   GLUCOSE  --  128*  --  108* 128* 150*  BUN  --  13  --  13 15 15   CREATININE  --  <0.30*  --  <0.30* 0.47* <0.30*  CALCIUM  --  9.3  --  9.3 9.5 9.3  MG 1.7 1.7 1.5* 2.0  --  1.7  PHOS  --  4.0  --  4.5  --  4.1    ABG: No results for input(s): PHART, PCO2ART, PO2ART, HCO3, O2SAT in the last 168 hours.  Liver Function Tests: Recent Labs  Lab 01/23/19 0445  ALBUMIN 2.0*   No results for input(s): LIPASE, AMYLASE in the last 168 hours. No results for input(s): AMMONIA in the last 168 hours.  CBC: Recent Labs  Lab  01/21/19 0526 01/23/19 0445 01/25/19 0747  WBC 10.7* 11.4* 10.1  HGB 9.0* 8.8* 8.5*  HCT 29.0* 28.7* 27.2*  MCV 91.5 92.0 91.6  PLT 334 305 274    Cardiac Enzymes: No results for input(s): CKTOTAL, CKMB, CKMBINDEX, TROPONINI in the last 168 hours.  BNP (last 3 results) No results for input(s): BNP in the last 8760 hours.  ProBNP (last 3 results) No results for input(s): PROBNP in the last 8760 hours.  Radiological Exams: DG CHEST PORT 1 VIEW  Result Date: 01/25/2019 CLINICAL DATA:  Spinal cord injury.  Cough and pneumonia. EXAM: PORTABLE CHEST 1 VIEW COMPARISON:  01/16/2019 FINDINGS: Previous tracheostomy. Patchy infiltrates in the lower lobes left more than right have improved but not completely resolved. Small amount of pleural fluid on the left. No worsening or other new finding. IMPRESSION: Some improvement in patchy bilateral lower lobe pneumonia left more than right, but not completely resolved yet. Small amount of pleural fluid on the left. Electronically Signed   By: 01/27/2019 M.D.   On: 01/25/2019 10:10    Assessment/Plan Active Problems:   Acute on chronic respiratory failure with hypoxia (HCC)   Lobar pneumonia,  unspecified organism [facility])   Chronic congestive heart failure (HCC)   Closed C7 fracture without spinal cord injury, sequela   Pulmonary embolism and infarction ([place])   1. Acute on chronic respiratory failure hypoxia plan is to continue with capping as he shows improvement we will continue to assess possibility of decannulation 2. Lobar pneumonia treated we will continue with supportive care. 3. Chronic congestive heart failure compensated 4. C7 fracture continues to remain in the neck collar 5. Pulmonary embolism treated   I have personally seen and evaluated the patient, evaluated laboratory and imaging results, formulated the assessment and plan and placed orders. The Patient requires high complexity decision making for assessment and support.   Case was discussed on Rounds with the Respiratory Therapy Staff  Allyne Gee, MD [facility] Pulmonary Critical Care Medicine Sleep Medicine

## 2019-01-27 DIAGNOSIS — S12690S Other displaced fracture of seventh cervical vertebra, sequela: Secondary | ICD-10-CM | POA: Diagnosis not present

## 2019-01-27 DIAGNOSIS — I509 Heart failure, unspecified: Secondary | ICD-10-CM | POA: Diagnosis not present

## 2019-01-27 DIAGNOSIS — J9621 Acute and chronic respiratory failure with hypoxia: Secondary | ICD-10-CM | POA: Diagnosis not present

## 2019-01-27 DIAGNOSIS — J181 Lobar pneumonia, unspecified organism: Secondary | ICD-10-CM | POA: Diagnosis not present

## 2019-01-27 NOTE — Progress Notes (Signed)
Pulmonary Critical Care Medicine [place]   PULMONARY CRITICAL CARE SERVICE  PROGRESS NOTE  Date of Service: 01/27/2019  Raymond Avila  [id number]  DOB: December 15, 1965   DOA: 12/23/2018  Referring Physician: Merton Border, MD  HPI: Raymond Avila is a 53 y.o. male seen for follow up of Acute on Chronic Respiratory Failure.  Patient currently is capping has been on 21% no distress is noted at this time  Medications: Reviewed on Rounds  Physical Exam:  Vitals: Temperature 97.4 pulse 73 respiratory rate 17 blood pressure 98/50 saturations 99%  Ventilator Settings capping off the ventilator right now  . General: Comfortable at this time . Eyes: Grossly normal lids, irises & conjunctiva . ENT: grossly tongue is normal . Neck: no obvious mass . Cardiovascular: S1 S2 normal no gallop . Respiratory: No rhonchi no rales are noted at this time . Abdomen: soft . Skin: no rash seen on limited exam . Musculoskeletal: not rigid . Psychiatric:unable to assess . Neurologic: no seizure no involuntary movements         Lab Data:   Basic Metabolic Panel: Recent Labs  Lab 01/21/19 0526 01/22/19 1258 01/23/19 0445 01/24/19 0532 01/25/19 0747  NA 135  --  130* 130* 133*  K 4.3  --  4.7 3.9 3.9  CL 93*  --  94* 94* 96*  CO2 33*  --  26 24 29   GLUCOSE 128*  --  108* 128* 150*  BUN 13  --  13 15 15   CREATININE <0.30*  --  <0.30* 0.47* <0.30*  CALCIUM 9.3  --  9.3 9.5 9.3  MG 1.7 1.5* 2.0  --  1.7  PHOS 4.0  --  4.5  --  4.1    ABG: No results for input(s): PHART, PCO2ART, PO2ART, HCO3, O2SAT in the last 168 hours.  Liver Function Tests: Recent Labs  Lab 01/23/19 0445  ALBUMIN 2.0*   No results for input(s): LIPASE, AMYLASE in the last 168 hours. No results for input(s): AMMONIA in the last 168 hours.  CBC: Recent Labs  Lab 01/21/19 0526 01/23/19 0445 01/25/19 0747  WBC 10.7* 11.4* 10.1  HGB 9.0* 8.8* 8.5*  HCT 29.0* 28.7* 27.2*  MCV 91.5  92.0 91.6  PLT 334 305 274    Cardiac Enzymes: No results for input(s): CKTOTAL, CKMB, CKMBINDEX, TROPONINI in the last 168 hours.  BNP (last 3 results) No results for input(s): BNP in the last 8760 hours.  ProBNP (last 3 results) No results for input(s): PROBNP in the last 8760 hours.  Radiological Exams: No results found.  Assessment/Plan Active Problems:   Acute on chronic respiratory failure with hypoxia (HCC)   Lobar pneumonia, unspecified organism (HCC)   Chronic congestive heart failure (HCC)   Closed C7 fracture without spinal cord injury, sequela   Pulmonary embolism and infarction ([place])   1. Acute on chronic respiratory failure hypoxia doing well with capping which will be continued 2. Lobar pneumonia treated we will continue to follow 3. Chronic congestive heart failure baseline we will continue supportive care monitor fluid status 4. C7 fracture status post repair 5. Pulmonary embolism treated we will continue present management   I have personally seen and evaluated the patient, evaluated laboratory and imaging results, formulated the assessment and plan and placed orders. The Patient requires high complexity decision making for assessment and support.  Case was discussed on Rounds with the Respiratory Therapy Staff  Allyne Gee, MD [facility] Pulmonary Critical Care Medicine  Sleep Medicine

## 2019-01-28 DIAGNOSIS — J181 Lobar pneumonia, unspecified organism: Secondary | ICD-10-CM | POA: Diagnosis not present

## 2019-01-28 DIAGNOSIS — I509 Heart failure, unspecified: Secondary | ICD-10-CM | POA: Diagnosis not present

## 2019-01-28 DIAGNOSIS — S12690S Other displaced fracture of seventh cervical vertebra, sequela: Secondary | ICD-10-CM | POA: Diagnosis not present

## 2019-01-28 DIAGNOSIS — J9621 Acute and chronic respiratory failure with hypoxia: Secondary | ICD-10-CM | POA: Diagnosis not present

## 2019-01-28 LAB — VALPROIC ACID LEVEL: Valproic Acid Lvl: 29 ug/mL — ABNORMAL LOW (ref 50.0–100.0)

## 2019-01-28 NOTE — Progress Notes (Addendum)
Pulmonary Critical Care Medicine [place]   PULMONARY CRITICAL CARE SERVICE  PROGRESS NOTE  Date of Service: 01/28/2019  Raymond Avila  [id number]  DOB: Jan 03, 1966   DOA: 12/23/2018  Referring Physician: Merton Border, MD  HPI: Raymond Avila is a 53 y.o. male seen for follow up of Acute on Chronic Respiratory Failure.  Patient is Actually looks good still has periodic confusion noted  Medications: Reviewed on Rounds  Physical Exam:  Vitals: Temperature 98.1 pulse 90 respiratory 26 blood pressure 117/70 saturations 99%  Ventilator Settings capping right now off the ventilator  . General: Comfortable at this time . Eyes: Grossly normal lids, irises & conjunctiva . ENT: grossly tongue is normal . Neck: no obvious mass . Cardiovascular: S1 S2 normal no gallop . Respiratory: No rhonchi no rales are noted at this time . Abdomen: soft . Skin: no rash seen on limited exam . Musculoskeletal: not rigid . Psychiatric:unable to assess . Neurologic: no seizure no involuntary movements         Lab Data:   Basic Metabolic Panel: Recent Labs  Lab 01/22/19 1258 01/23/19 0445 01/24/19 0532 01/25/19 0747  NA  --  130* 130* 133*  K  --  4.7 3.9 3.9  CL  --  94* 94* 96*  CO2  --  26 24 29   GLUCOSE  --  108* 128* 150*  BUN  --  13 15 15   CREATININE  --  <0.30* 0.47* <0.30*  CALCIUM  --  9.3 9.5 9.3  MG 1.5* 2.0  --  1.7  PHOS  --  4.5  --  4.1    ABG: No results for input(s): PHART, PCO2ART, PO2ART, HCO3, O2SAT in the last 168 hours.  Liver Function Tests: Recent Labs  Lab 01/23/19 0445  ALBUMIN 2.0*   No results for input(s): LIPASE, AMYLASE in the last 168 hours. No results for input(s): AMMONIA in the last 168 hours.  CBC: Recent Labs  Lab 01/23/19 0445 01/25/19 0747  WBC 11.4* 10.1  HGB 8.8* 8.5*  HCT 28.7* 27.2*  MCV 92.0 91.6  PLT 305 274    Cardiac Enzymes: No results for input(s): CKTOTAL, CKMB, CKMBINDEX, TROPONINI in  the last 168 hours.  BNP (last 3 results) No results for input(s): BNP in the last 8760 hours.  ProBNP (last 3 results) No results for input(s): PROBNP in the last 8760 hours.  Radiological Exams: No results found.  Assessment/Plan Active Problems:   Acute on chronic respiratory failure with hypoxia (HCC)   Lobar pneumonia, unspecified organism (HCC)   Chronic congestive heart failure (HCC)   Closed C7 fracture without spinal cord injury, sequela   Pulmonary embolism and infarction ([place])   1. Acute on chronic respiratory failure with hypoxia patient is capping currently doing fairly well 2. Lobar pneumonia treated we will continue with present management 3. Chronic congestive heart failure at baseline 4. C7 fracture no changes 5. Pulmonary embolism treated   I have personally seen and evaluated the patient, evaluated laboratory and imaging results, formulated the assessment and plan and placed orders. The Patient requires high complexity decision making for assessment and support.  Case was discussed on Rounds with the Respiratory Therapy Staff  Allyne Gee, MD [facility] Pulmonary Critical Care Medicine Sleep Medicine

## 2019-01-29 ENCOUNTER — Other Ambulatory Visit (HOSPITAL_COMMUNITY): Payer: Medicare Other

## 2019-01-29 DIAGNOSIS — J9621 Acute and chronic respiratory failure with hypoxia: Secondary | ICD-10-CM | POA: Diagnosis not present

## 2019-01-29 DIAGNOSIS — S12690S Other displaced fracture of seventh cervical vertebra, sequela: Secondary | ICD-10-CM | POA: Diagnosis not present

## 2019-01-29 DIAGNOSIS — J181 Lobar pneumonia, unspecified organism: Secondary | ICD-10-CM | POA: Diagnosis not present

## 2019-01-29 DIAGNOSIS — I509 Heart failure, unspecified: Secondary | ICD-10-CM | POA: Diagnosis not present

## 2019-01-29 MED ORDER — IOHEXOL 300 MG/ML  SOLN
100.0000 mL | Freq: Once | INTRAMUSCULAR | Status: AC | PRN
  Administered 2019-01-29: 100 mL via INTRAVENOUS

## 2019-01-29 NOTE — Progress Notes (Signed)
Pulmonary Critical Care Medicine [place]   PULMONARY CRITICAL CARE SERVICE  PROGRESS NOTE  Date of Service: 01/29/2019  Raymond Avila  [id number]  DOB: 08-15-1965   DOA: 12/23/2018  Referring Physician: Merton Border, MD  HPI: Raymond Avila is a 53 y.o. male seen for follow up of Acute on Chronic Respiratory Failure.  Patient currently is capping has been on room air looks good right now  Medications: Reviewed on Rounds  Physical Exam:  Vitals: Temperature 99.1 pulse 86 respiratory rate 16 blood pressure 122/73 saturations 97%  Ventilator Settings capping right now on room air  . General: Comfortable at this time . Eyes: Grossly normal lids, irises & conjunctiva . ENT: grossly tongue is normal . Neck: no obvious mass . Cardiovascular: S1 S2 normal no gallop . Respiratory: No rhonchi no rales are noted at this time . Abdomen: soft . Skin: no rash seen on limited exam . Musculoskeletal: not rigid . Psychiatric:unable to assess . Neurologic: no seizure no involuntary movements         Lab Data:   Basic Metabolic Panel: Recent Labs  Lab 01/23/19 0445 01/24/19 0532 01/25/19 0747  NA 130* 130* 133*  K 4.7 3.9 3.9  CL 94* 94* 96*  CO2 26 24 29   GLUCOSE 108* 128* 150*  BUN 13 15 15   CREATININE <0.30* 0.47* <0.30*  CALCIUM 9.3 9.5 9.3  MG 2.0  --  1.7  PHOS 4.5  --  4.1    ABG: No results for input(s): PHART, PCO2ART, PO2ART, HCO3, O2SAT in the last 168 hours.  Liver Function Tests: Recent Labs  Lab 01/23/19 0445  ALBUMIN 2.0*   No results for input(s): LIPASE, AMYLASE in the last 168 hours. No results for input(s): AMMONIA in the last 168 hours.  CBC: Recent Labs  Lab 01/23/19 0445 01/25/19 0747  WBC 11.4* 10.1  HGB 8.8* 8.5*  HCT 28.7* 27.2*  MCV 92.0 91.6  PLT 305 274    Cardiac Enzymes: No results for input(s): CKTOTAL, CKMB, CKMBINDEX, TROPONINI in the last 168 hours.  BNP (last 3 results) No results for  input(s): BNP in the last 8760 hours.  ProBNP (last 3 results) No results for input(s): PROBNP in the last 8760 hours.  Radiological Exams: No results found.  Assessment/Plan Active Problems:   Acute on chronic respiratory failure with hypoxia (HCC)   Lobar pneumonia, unspecified organism (HCC)   Chronic congestive heart failure (HCC)   Closed C7 fracture without spinal cord injury, sequela   Pulmonary embolism and infarction ([place])   1. Acute on chronic respiratory failure hypoxia plan is to continue with capping trials titrate oxygen continue pulmonary toilet. 2. Lobar pneumonia treated we will continue with supportive care 3. Chronic congestive heart failure no change 4. C7 fracture at baseline 5. Pulmonary embolism treated   I have personally seen and evaluated the patient, evaluated laboratory and imaging results, formulated the assessment and plan and placed orders. The Patient requires high complexity decision making for assessment and support.  Case was discussed on Rounds with the Respiratory Therapy Staff  Allyne Gee, MD [facility] Pulmonary Critical Care Medicine Sleep Medicine

## 2019-01-30 DIAGNOSIS — J181 Lobar pneumonia, unspecified organism: Secondary | ICD-10-CM | POA: Diagnosis not present

## 2019-01-30 DIAGNOSIS — I509 Heart failure, unspecified: Secondary | ICD-10-CM | POA: Diagnosis not present

## 2019-01-30 DIAGNOSIS — S12690S Other displaced fracture of seventh cervical vertebra, sequela: Secondary | ICD-10-CM | POA: Diagnosis not present

## 2019-01-30 DIAGNOSIS — J9621 Acute and chronic respiratory failure with hypoxia: Secondary | ICD-10-CM | POA: Diagnosis not present

## 2019-01-30 NOTE — Progress Notes (Addendum)
Pulmonary Critical Care Medicine [facility] [place]   PULMONARY CRITICAL CARE SERVICE  PROGRESS NOTE  Date of Service: 01/30/2019  KUBA SHEPHERD  [id number]  DOB: 12/22/1965   DOA: 12/23/2018  Referring Physician: [name], MD  HPI: Raymond Avila is a 53 y.o. male seen for follow up of Acute on Chronic Respiratory Failure.  Continues to do well with Capping the patient is still having some issues with is weak cough  Medications: Reviewed on Rounds  Physical Exam:  Vitals: Temperature 98.6 pulse 107 respiratory 26 blood pressure 118/70 saturations 96%  Ventilator Settings capping right now off the vent  . General: Comfortable at this time . Eyes: Grossly normal lids, irises & conjunctiva . ENT: grossly tongue is normal . Neck: no obvious mass . Cardiovascular: S1 S2 normal no gallop . Respiratory: No rhonchi no rales are noted at this time . Abdomen: soft . Skin: no rash seen on limited exam . Musculoskeletal: not rigid . Psychiatric:unable to assess . Neurologic: no seizure no involuntary movements         Lab Data:   Basic Metabolic Panel: Recent Labs  Lab 01/24/19 0532 01/25/19 0747  NA 130* 133*  K 3.9 3.9  CL 94* 96*  CO2 24 29  GLUCOSE 128* 150*  BUN 15 15  CREATININE 0.47* <0.30*  CALCIUM 9.5 9.3  MG  --  1.7  PHOS  --  4.1    ABG: No results for input(s): PHART, PCO2ART, PO2ART, HCO3, O2SAT in the last 168 hours.  Liver Function Tests: No results for input(s): AST, ALT, ALKPHOS, BILITOT, PROT, ALBUMIN in the last 168 hours. No results for input(s): LIPASE, AMYLASE in the last 168 hours. No results for input(s): AMMONIA in the last 168 hours.  CBC: Recent Labs  Lab 01/25/19 0747  WBC 10.1  HGB 8.5*  HCT 27.2*  MCV 91.6  PLT 274    Cardiac Enzymes: No results for input(s): CKTOTAL, CKMB, CKMBINDEX, TROPONINI in the last 168 hours.  BNP (last 3 results) No results for input(s): BNP in the last 8760  hours.  ProBNP (last 3 results) No results for input(s): PROBNP in the last 8760 hours.  Radiological Exams: CT PELVIS W CONTRAST  Result Date: 01/29/2019 CLINICAL DATA:  Anal or rectal abscess. EXAM: CT PELVIS WITH CONTRAST TECHNIQUE: Multidetector CT imaging of the pelvis was performed using the standard protocol following the bolus administration of intravenous contrast. CONTRAST:  OMNIPAQUE IOHEXOL 300 MG/ML  SOLN COMPARISON:  None. FINDINGS: Urinary Tract: Urinary bladder is decompressed secondary to Foley catheter. Bowel: Rectal tube is in place. No definite evidence of perirectal or perianal abscess is noted. The appendix appears normal. There is no definite abnormality seen involving the visualized bowel loops. Vascular/Lymphatic: No pathologically enlarged lymph nodes. No significant vascular abnormality seen. Reproductive:  No mass or other significant abnormality Other: Skin thickening and subcutaneous inflammatory changes are noted in the right buttocks concerning for cellulitis. No fluid collection or abscess is noted. Musculoskeletal: No suspicious bone lesions identified. IMPRESSION: Findings consistent with cellulitis involving the right buttocks, but no definite abscess or fluid collection is noted. Rectal tube is in place. Electronically Signed   By: Lupita Raider M.D.   On: 01/29/2019 15:24    Assessment/Plan Active Problems:   Acute on chronic respiratory failure with hypoxia (HCC)   Lobar pneumonia, unspecified organism (HCC)   Chronic congestive heart failure (HCC)   Closed C7 fracture without spinal cord injury, sequela  Pulmonary embolism and infarction ([place])   1. Acute on chronic respiratory failure hypoxia we will continue with capping trials as ordered continue secretion management pulmonary toilet. 2. Lobar pneumonia treated we will continue with the present management clinically is improving 3. Chronic congestive heart failure compensated 4. C7 fracture  no change therapy as tolerated 5. Pulmonary embolism treated   I have personally seen and evaluated the patient, evaluated laboratory and imaging results, formulated the assessment and plan and placed orders. The Patient requires high complexity decision making for assessment and support.  Case was discussed on Rounds with the Respiratory Therapy Staff  Allyne Gee, MD [facility] Pulmonary Critical Care Medicine Sleep Medicine

## 2019-01-31 DIAGNOSIS — S12690S Other displaced fracture of seventh cervical vertebra, sequela: Secondary | ICD-10-CM | POA: Diagnosis not present

## 2019-01-31 DIAGNOSIS — J9621 Acute and chronic respiratory failure with hypoxia: Secondary | ICD-10-CM | POA: Diagnosis not present

## 2019-01-31 DIAGNOSIS — I509 Heart failure, unspecified: Secondary | ICD-10-CM | POA: Diagnosis not present

## 2019-01-31 DIAGNOSIS — J181 Lobar pneumonia, unspecified organism: Secondary | ICD-10-CM | POA: Diagnosis not present

## 2019-01-31 NOTE — Progress Notes (Signed)
Pulmonary Critical Care Medicine [place]   PULMONARY CRITICAL CARE SERVICE  PROGRESS NOTE  Date of Service: 01/31/2019  JONATHYN CAROTHERS  LNL:[id number]  DOB: 1966-02-07   DOA: 12/23/2018  Referring Physician: Merton Border, MD  HPI: NAYQUAN EVINGER is a 53 y.o. male seen for follow up of Acute on Chronic Respiratory Failure.  Patient is doing well with capping trials plan is to continue to monitor his secretions if they improve we will look at the possibility of decannulation  Medications: Reviewed on Rounds  Physical Exam:  Vitals: Temperature 100.6 pulse 97 respiratory 29 blood pressure 132/70 saturations 98%  Ventilator Settings capping off the ventilator right now  . General: Comfortable at this time . Eyes: Grossly normal lids, irises & conjunctiva . ENT: grossly tongue is normal . Neck: no obvious mass . Cardiovascular: S1 S2 normal no gallop . Respiratory: No rhonchi no rales are noted at this time . Abdomen: soft . Skin: no rash seen on limited exam . Musculoskeletal: not rigid . Psychiatric:unable to assess . Neurologic: no seizure no involuntary movements         Lab Data:   Basic Metabolic Panel: Recent Labs  Lab 01/25/19 0747  NA 133*  K 3.9  CL 96*  CO2 29  GLUCOSE 150*  BUN 15  CREATININE <0.30*  CALCIUM 9.3  MG 1.7  PHOS 4.1    ABG: No results for input(s): PHART, PCO2ART, PO2ART, HCO3, O2SAT in the last 168 hours.  Liver Function Tests: No results for input(s): AST, ALT, ALKPHOS, BILITOT, PROT, ALBUMIN in the last 168 hours. No results for input(s): LIPASE, AMYLASE in the last 168 hours. No results for input(s): AMMONIA in the last 168 hours.  CBC: Recent Labs  Lab 01/25/19 0747  WBC 10.1  HGB 8.5*  HCT 27.2*  MCV 91.6  PLT 274    Cardiac Enzymes: No results for input(s): CKTOTAL, CKMB, CKMBINDEX, TROPONINI in the last 168 hours.  BNP (last 3 results) No results for input(s): BNP in the last 8760  hours.  ProBNP (last 3 results) No results for input(s): PROBNP in the last 8760 hours.  Radiological Exams: No results found.  Assessment/Plan Active Problems:   Acute on chronic respiratory failure with hypoxia (HCC)   Lobar pneumonia, unspecified organism (HCC)   Chronic congestive heart failure (HCC)   Closed C7 fracture without spinal cord injury, sequela   Pulmonary embolism and infarction ([place])   1. Acute on chronic respiratory failure hypoxia plan is to continue with capping trials titrate oxygen continue pulmonary toilet 2. Lobar pneumonia treated clinically resolved 3. Chronic congestive heart failure compensated 4. C7 fracture no change 5. Pulmonary embolism at baseline   I have personally seen and evaluated the patient, evaluated laboratory and imaging results, formulated the assessment and plan and placed orders. The Patient requires high complexity decision making for assessment and support.  Case was discussed on Rounds with the Respiratory Therapy Staff  Allyne Gee, MD [facility] Pulmonary Critical Care Medicine Sleep Medicine

## 2019-02-01 DIAGNOSIS — J181 Lobar pneumonia, unspecified organism: Secondary | ICD-10-CM | POA: Diagnosis not present

## 2019-02-01 DIAGNOSIS — S12690S Other displaced fracture of seventh cervical vertebra, sequela: Secondary | ICD-10-CM | POA: Diagnosis not present

## 2019-02-01 DIAGNOSIS — I509 Heart failure, unspecified: Secondary | ICD-10-CM | POA: Diagnosis not present

## 2019-02-01 DIAGNOSIS — J9621 Acute and chronic respiratory failure with hypoxia: Secondary | ICD-10-CM | POA: Diagnosis not present

## 2019-02-01 LAB — CBC WITH DIFFERENTIAL/PLATELET
Abs Immature Granulocytes: 0.06 10*3/uL (ref 0.00–0.07)
Basophils Absolute: 0.1 10*3/uL (ref 0.0–0.1)
Basophils Relative: 1 %
Eosinophils Absolute: 0.4 10*3/uL (ref 0.0–0.5)
Eosinophils Relative: 4 %
HCT: 30.1 % — ABNORMAL LOW (ref 39.0–52.0)
Hemoglobin: 9.2 g/dL — ABNORMAL LOW (ref 13.0–17.0)
Immature Granulocytes: 1 %
Lymphocytes Relative: 8 %
Lymphs Abs: 0.8 10*3/uL (ref 0.7–4.0)
MCH: 28.3 pg (ref 26.0–34.0)
MCHC: 30.6 g/dL (ref 30.0–36.0)
MCV: 92.6 fL (ref 80.0–100.0)
Monocytes Absolute: 1.4 10*3/uL — ABNORMAL HIGH (ref 0.1–1.0)
Monocytes Relative: 14 %
Neutro Abs: 6.8 10*3/uL (ref 1.7–7.7)
Neutrophils Relative %: 72 %
Platelets: 322 10*3/uL (ref 150–400)
RBC: 3.25 MIL/uL — ABNORMAL LOW (ref 4.22–5.81)
RDW: 15.9 % — ABNORMAL HIGH (ref 11.5–15.5)
WBC: 9.4 10*3/uL (ref 4.0–10.5)
nRBC: 0 % (ref 0.0–0.2)

## 2019-02-01 LAB — BASIC METABOLIC PANEL
Anion gap: 10 (ref 5–15)
BUN: 21 mg/dL — ABNORMAL HIGH (ref 6–20)
CO2: 30 mmol/L (ref 22–32)
Calcium: 9.5 mg/dL (ref 8.9–10.3)
Chloride: 95 mmol/L — ABNORMAL LOW (ref 98–111)
Creatinine, Ser: <0.3 mg/dL — ABNORMAL LOW (ref 0.61–1.24)
Glucose, Bld: 141 mg/dL — ABNORMAL HIGH (ref 70–99)
Potassium: 3.5 mmol/L (ref 3.5–5.1)
Sodium: 135 mmol/L (ref 135–145)

## 2019-02-01 LAB — VANCOMYCIN, TROUGH: Vancomycin Tr: 11 ug/mL — ABNORMAL LOW (ref 15–20)

## 2019-02-01 NOTE — Progress Notes (Signed)
Pulmonary Critical Care Medicine [place]   PULMONARY CRITICAL CARE SERVICE  PROGRESS NOTE  Date of Service: 02/01/2019  JAYVEON CONVEY  [id number]  DOB: April 21, 1965   DOA: 12/23/2018  Referring Physician: Merton Border, MD  HPI: Raymond Avila is a 53 y.o. male seen for follow up of Acute on Chronic Respiratory Failure.  Patient is capping doing well right now is on 2 L oxygen  Medications: Reviewed on Rounds  Physical Exam:  Vitals: Temperature 98.4 pulse 81 respiratory 15 blood pressure 146/83 saturations 96%  Ventilator Settings capping currently is off the ventilator on 2 L oxygen  . General: Comfortable at this time . Eyes: Grossly normal lids, irises & conjunctiva . ENT: grossly tongue is normal . Neck: no obvious mass . Cardiovascular: S1 S2 normal no gallop . Respiratory: No rhonchi no rales are noted at this time . Abdomen: soft . Skin: no rash seen on limited exam . Musculoskeletal: not rigid . Psychiatric:unable to assess . Neurologic: no seizure no involuntary movements         Lab Data:   Basic Metabolic Panel: Recent Labs  Lab 02/01/19 0635  NA 135  K 3.5  CL 95*  CO2 30  GLUCOSE 141*  BUN 21*  CREATININE <0.30*  CALCIUM 9.5    ABG: No results for input(s): PHART, PCO2ART, PO2ART, HCO3, O2SAT in the last 168 hours.  Liver Function Tests: No results for input(s): AST, ALT, ALKPHOS, BILITOT, PROT, ALBUMIN in the last 168 hours. No results for input(s): LIPASE, AMYLASE in the last 168 hours. No results for input(s): AMMONIA in the last 168 hours.  CBC: Recent Labs  Lab 02/01/19 0635  WBC 9.4  NEUTROABS 6.8  HGB 9.2*  HCT 30.1*  MCV 92.6  PLT 322    Cardiac Enzymes: No results for input(s): CKTOTAL, CKMB, CKMBINDEX, TROPONINI in the last 168 hours.  BNP (last 3 results) No results for input(s): BNP in the last 8760 hours.  ProBNP (last 3 results) No results for input(s): PROBNP in the last 8760  hours.  Radiological Exams: No results found.  Assessment/Plan Active Problems:   Acute on chronic respiratory failure with hypoxia (HCC)   Lobar pneumonia, unspecified organism (HCC)   Chronic congestive heart failure (HCC)   Closed C7 fracture without spinal cord injury, sequela   Pulmonary embolism and infarction ([place])   1. Acute on chronic respiratory failure with hypoxia plan is to continue with capping trials patient is on 2 L oxygen.  Continue secretion management supportive care 2. Lobar pneumonia treated clinically improving 3. Chronic congestive heart failure compensated 4. C7 fracture stable 5. Pulmonary embolism no change we will continue present management treated   I have personally seen and evaluated the patient, evaluated laboratory and imaging results, formulated the assessment and plan and placed orders. The Patient requires high complexity decision making for assessment and support.  Case was discussed on Rounds with the Respiratory Therapy Staff  Allyne Gee, MD [facility] Pulmonary Critical Care Medicine Sleep Medicine

## 2019-02-02 DIAGNOSIS — S12690S Other displaced fracture of seventh cervical vertebra, sequela: Secondary | ICD-10-CM | POA: Diagnosis not present

## 2019-02-02 DIAGNOSIS — I509 Heart failure, unspecified: Secondary | ICD-10-CM | POA: Diagnosis not present

## 2019-02-02 DIAGNOSIS — J9621 Acute and chronic respiratory failure with hypoxia: Secondary | ICD-10-CM | POA: Diagnosis not present

## 2019-02-02 DIAGNOSIS — J181 Lobar pneumonia, unspecified organism: Secondary | ICD-10-CM | POA: Diagnosis not present

## 2019-02-02 NOTE — Progress Notes (Signed)
Pulmonary Critical Care Medicine [place]   PULMONARY CRITICAL CARE SERVICE  PROGRESS NOTE  Date of Service: 02/02/2019  Raymond Avila  SJG:[id number]  DOB: [birth year]   DOA: 12/23/2018  Referring Physician: Merton Border, MD  HPI: Raymond Avila is a 53 y.o. male seen for follow up of Acute on Chronic Respiratory Failure.  Patient right now is capping and is doing fairly well currently on about 2 L oxygen  Medications: Reviewed on Rounds  Physical Exam:  Vitals: Temperature 96.8 pulse 80 respiratory 18 blood pressure 119/60 saturations 99%  Ventilator Settings capping off the ventilator at this time  . General: Comfortable at this time . Eyes: Grossly normal lids, irises & conjunctiva . ENT: grossly tongue is normal . Neck: no obvious mass . Cardiovascular: S1 S2 normal no gallop . Respiratory: No rhonchi coarse breath sounds are noted . Abdomen: soft . Skin: no rash seen on limited exam . Musculoskeletal: not rigid . Psychiatric:unable to assess . Neurologic: no seizure no involuntary movements         Lab Data:   Basic Metabolic Panel: Recent Labs  Lab 02/01/19 0635  NA 135  K 3.5  CL 95*  CO2 30  GLUCOSE 141*  BUN 21*  CREATININE <0.30*  CALCIUM 9.5    ABG: No results for input(s): PHART, PCO2ART, PO2ART, HCO3, O2SAT in the last 168 hours.  Liver Function Tests: No results for input(s): AST, ALT, ALKPHOS, BILITOT, PROT, ALBUMIN in the last 168 hours. No results for input(s): LIPASE, AMYLASE in the last 168 hours. No results for input(s): AMMONIA in the last 168 hours.  CBC: Recent Labs  Lab 02/01/19 0635  WBC 9.4  NEUTROABS 6.8  HGB 9.2*  HCT 30.1*  MCV 92.6  PLT 322    Cardiac Enzymes: No results for input(s): CKTOTAL, CKMB, CKMBINDEX, TROPONINI in the last 168 hours.  BNP (last 3 results) No results for input(s): BNP in the last 8760 hours.  ProBNP (last 3 results) No results for input(s): PROBNP in the last  8760 hours.  Radiological Exams: No results found.  Assessment/Plan Active Problems:   Acute on chronic respiratory failure with hypoxia (HCC)   Lobar pneumonia, unspecified organism (HCC)   Chronic congestive heart failure (HCC)   Closed C7 fracture without spinal cord injury, sequela   Pulmonary embolism and infarction ([place])   1. Acute on chronic respiratory failure with hypoxia plan is to continue with capping trials as ordered continue secretion management pulmonary toilet. 2. Lobar pneumonia treated we will continue to follow along 3. Chronic congestive heart failure baseline 4. C7 fracture no changes noted patient is status post repair 5. Pulmonary embolism treated   I have personally seen and evaluated the patient, evaluated laboratory and imaging results, formulated the assessment and plan and placed orders. The Patient requires high complexity decision making for assessment and support.  Case was discussed on Rounds with the Respiratory Therapy Staff  Allyne Gee, MD [facility] Pulmonary Critical Care Medicine Sleep Medicine

## 2019-02-03 DIAGNOSIS — J9621 Acute and chronic respiratory failure with hypoxia: Secondary | ICD-10-CM | POA: Diagnosis not present

## 2019-02-03 DIAGNOSIS — S12690S Other displaced fracture of seventh cervical vertebra, sequela: Secondary | ICD-10-CM | POA: Diagnosis not present

## 2019-02-03 DIAGNOSIS — J181 Lobar pneumonia, unspecified organism: Secondary | ICD-10-CM | POA: Diagnosis not present

## 2019-02-03 DIAGNOSIS — I509 Heart failure, unspecified: Secondary | ICD-10-CM | POA: Diagnosis not present

## 2019-02-03 NOTE — Progress Notes (Signed)
Pulmonary Critical Care Medicine [place]   PULMONARY CRITICAL CARE SERVICE  PROGRESS NOTE  Date of Service: 02/03/2019  MAGGIE DWORKIN  PZW:[id number]  DOB: 07/08/1965   DOA: 12/23/2018  Referring Physician: Merton Border, MD  HPI: Raymond Avila is a 53 y.o. male seen for follow up of Acute on Chronic Respiratory Failure.  Patient currently is capping doing relatively well.  Secretions are reported to moderate  Medications: Reviewed on Rounds  Physical Exam:  Vitals: Temperature 100.0 pulse 87 respiratory rate 19 blood pressure 130/75 saturations 96%  Ventilator Settings capping right now off the ventilator  . General: Comfortable at this time . Eyes: Grossly normal lids, irises & conjunctiva . ENT: grossly tongue is normal . Neck: no obvious mass . Cardiovascular: S1 S2 normal no gallop . Respiratory: Coarse breath sounds with a few scattered rhonchi . Abdomen: soft . Skin: no rash seen on limited exam . Musculoskeletal: not rigid . Psychiatric:unable to assess . Neurologic: no seizure no involuntary movements         Lab Data:   Basic Metabolic Panel: Recent Labs  Lab 02/01/19 0635  NA 135  K 3.5  CL 95*  CO2 30  GLUCOSE 141*  BUN 21*  CREATININE <0.30*  CALCIUM 9.5    ABG: No results for input(s): PHART, PCO2ART, PO2ART, HCO3, O2SAT in the last 168 hours.  Liver Function Tests: No results for input(s): AST, ALT, ALKPHOS, BILITOT, PROT, ALBUMIN in the last 168 hours. No results for input(s): LIPASE, AMYLASE in the last 168 hours. No results for input(s): AMMONIA in the last 168 hours.  CBC: Recent Labs  Lab 02/01/19 0635  WBC 9.4  NEUTROABS 6.8  HGB 9.2*  HCT 30.1*  MCV 92.6  PLT 322    Cardiac Enzymes: No results for input(s): CKTOTAL, CKMB, CKMBINDEX, TROPONINI in the last 168 hours.  BNP (last 3 results) No results for input(s): BNP in the last 8760 hours.  ProBNP (last 3 results) No results for input(s):  PROBNP in the last 8760 hours.  Radiological Exams: No results found.  Assessment/Plan Active Problems:   Acute on chronic respiratory failure with hypoxia (HCC)   Lobar pneumonia, unspecified organism (HCC)   Chronic congestive heart failure (HCC)   Closed C7 fracture without spinal cord injury, sequela   Pulmonary embolism and infarction ([place])   1. Acute on chronic respiratory failure with hypoxia the patient will continue with capping trials slowly this point is improving cannulation 2. Lobar pneumonia treated supportive care 3. Chronic congestive heart failure baseline 4. C7 fracture no changes noted 5. Pulmonary embolism treated   I have personally seen and evaluated the patient, evaluated laboratory and imaging results, formulated the assessment and plan and placed orders. The Patient requires high complexity decision making for assessment and support.  Case was discussed on Rounds with the Respiratory Therapy Staff  Allyne Gee, MD [facility] Pulmonary Critical Care Medicine Sleep Medicine

## 2019-02-04 DIAGNOSIS — I509 Heart failure, unspecified: Secondary | ICD-10-CM | POA: Diagnosis not present

## 2019-02-04 DIAGNOSIS — J9621 Acute and chronic respiratory failure with hypoxia: Secondary | ICD-10-CM | POA: Diagnosis not present

## 2019-02-04 DIAGNOSIS — J181 Lobar pneumonia, unspecified organism: Secondary | ICD-10-CM | POA: Diagnosis not present

## 2019-02-04 DIAGNOSIS — S12690S Other displaced fracture of seventh cervical vertebra, sequela: Secondary | ICD-10-CM | POA: Diagnosis not present

## 2019-02-04 LAB — CBC
HCT: 27.4 % — ABNORMAL LOW (ref 39.0–52.0)
Hemoglobin: 8.5 g/dL — ABNORMAL LOW (ref 13.0–17.0)
MCH: 28.4 pg (ref 26.0–34.0)
MCHC: 31 g/dL (ref 30.0–36.0)
MCV: 91.6 fL (ref 80.0–100.0)
Platelets: 427 10*3/uL — ABNORMAL HIGH (ref 150–400)
RBC: 2.99 MIL/uL — ABNORMAL LOW (ref 4.22–5.81)
RDW: 15.9 % — ABNORMAL HIGH (ref 11.5–15.5)
WBC: 8.5 10*3/uL (ref 4.0–10.5)
nRBC: 0 % (ref 0.0–0.2)

## 2019-02-04 LAB — BASIC METABOLIC PANEL
Anion gap: 10 (ref 5–15)
BUN: 20 mg/dL (ref 6–20)
CO2: 32 mmol/L (ref 22–32)
Calcium: 9.5 mg/dL (ref 8.9–10.3)
Chloride: 92 mmol/L — ABNORMAL LOW (ref 98–111)
Creatinine, Ser: 0.41 mg/dL — ABNORMAL LOW (ref 0.61–1.24)
GFR calc Af Amer: >60 mL/min (ref >60)
GFR calc non Af Amer: >60 mL/min (ref >60)
Glucose, Bld: 127 mg/dL — ABNORMAL HIGH (ref 70–99)
Potassium: 4.2 mmol/L (ref 3.5–5.1)
Sodium: 134 mmol/L — ABNORMAL LOW (ref 135–145)

## 2019-02-04 LAB — MAGNESIUM: Magnesium: 2 mg/dL (ref 1.7–2.4)

## 2019-02-04 LAB — PHOSPHORUS: Phosphorus: 4.2 mg/dL (ref 2.5–4.6)

## 2019-02-04 NOTE — Progress Notes (Signed)
Pulmonary Critical Care Medicine [place]   PULMONARY CRITICAL CARE SERVICE  PROGRESS NOTE  Date of Service: 02/04/2019  Raymond Avila  KZS:[id number]  DOB: 1965/09/29   DOA: 12/23/2018  Referring Physician: Merton Border, MD  HPI: Raymond Avila is a 53 y.o. male seen for follow up of Acute on Chronic Respiratory Failure.  Patient is capping without any distress.  There is no apparent planned surgical exploration after this hospitalization per primary care team discussion today  Medications: Reviewed on Rounds  Physical Exam:  Vitals: Temperature 97.5 pulse 83 respiratory 18 blood pressure 119/67 saturations 97%  Ventilator Settings capping off the ventilator  . General: Comfortable at this time . Eyes: Grossly normal lids, irises & conjunctiva . ENT: grossly tongue is normal . Neck: no obvious mass . Cardiovascular: S1 S2 normal no gallop . Respiratory: No rhonchi no rales are noted at this time . Abdomen: soft . Skin: no rash seen on limited exam . Musculoskeletal: not rigid . Psychiatric:unable to assess . Neurologic: no seizure no involuntary movements         Lab Data:   Basic Metabolic Panel: Recent Labs  Lab 02/01/19 0635 02/04/19 0510  NA 135 134*  K 3.5 4.2  CL 95* 92*  CO2 30 32  GLUCOSE 141* 127*  BUN 21* 20  CREATININE <0.30* 0.41*  CALCIUM 9.5 9.5  MG  --  2.0  PHOS  --  4.2    ABG: No results for input(s): PHART, PCO2ART, PO2ART, HCO3, O2SAT in the last 168 hours.  Liver Function Tests: No results for input(s): AST, ALT, ALKPHOS, BILITOT, PROT, ALBUMIN in the last 168 hours. No results for input(s): LIPASE, AMYLASE in the last 168 hours. No results for input(s): AMMONIA in the last 168 hours.  CBC: Recent Labs  Lab 02/01/19 0635 02/04/19 0510  WBC 9.4 8.5  NEUTROABS 6.8  --   HGB 9.2* 8.5*  HCT 30.1* 27.4*  MCV 92.6 91.6  PLT 322 427*    Cardiac Enzymes: No results for input(s): CKTOTAL, CKMB,  CKMBINDEX, TROPONINI in the last 168 hours.  BNP (last 3 results) No results for input(s): BNP in the last 8760 hours.  ProBNP (last 3 results) No results for input(s): PROBNP in the last 8760 hours.  Radiological Exams: No results found.  Assessment/Plan Active Problems:   Acute on chronic respiratory failure with hypoxia (HCC)   Lobar pneumonia, unspecified organism (HCC)   Chronic congestive heart failure (HCC)   Closed C7 fracture without spinal cord injury, sequela   Pulmonary embolism and infarction ([place])   1. Acute on chronic respiratory failure hypoxia the plan is to continue with capping hopefully we might be able to be able to eventually decannulate him and more than likely because the patient does not require any further surgical intervention 2. Number pneumonia treated we will continue with supportive care 3. Chronic congestive heart failure patient is at baseline 4. C7 fracture status post ACDF 5. Pulmonary embolism treated we will continue to monitor closely   I have personally seen and evaluated the patient, evaluated laboratory and imaging results, formulated the assessment and plan and placed orders. The Patient requires high complexity decision making for assessment and support.  Case was discussed on Rounds with the Respiratory Therapy Staff  Allyne Gee, MD [facility] Pulmonary Critical Care Medicine Sleep Medicine

## 2019-02-05 DIAGNOSIS — S12690S Other displaced fracture of seventh cervical vertebra, sequela: Secondary | ICD-10-CM | POA: Diagnosis not present

## 2019-02-05 DIAGNOSIS — J181 Lobar pneumonia, unspecified organism: Secondary | ICD-10-CM | POA: Diagnosis not present

## 2019-02-05 DIAGNOSIS — J9621 Acute and chronic respiratory failure with hypoxia: Secondary | ICD-10-CM | POA: Diagnosis not present

## 2019-02-05 DIAGNOSIS — I509 Heart failure, unspecified: Secondary | ICD-10-CM | POA: Diagnosis not present

## 2019-02-05 NOTE — Progress Notes (Signed)
Pulmonary Critical Care Medicine [place]   PULMONARY CRITICAL CARE SERVICE  PROGRESS NOTE  Date of Service: 02/05/2019  Raymond Avila  KCL:[id number]  DOB: April 17, 1965   DOA: 12/23/2018  Referring Physician: Merton Border, MD  HPI: Raymond Avila is a 53 y.o. male seen for follow up of Acute on Chronic Respiratory Failure.  Patient is capping looks good he has been comfortable without any major distress no events noted overnight.  Medications: Reviewed on Rounds  Physical Exam:  Vitals: Temperature is 98.1 pulse 88 respiratory rate 15 blood pressure is 123/65 saturations 99%  Ventilator Settings capping off the ventilator  . General: Comfortable at this time . Eyes: Grossly normal lids, irises & conjunctiva . ENT: grossly tongue is normal . Neck: no obvious mass . Cardiovascular: S1 S2 normal no gallop . Respiratory: No rhonchi no rales are noted at this time . Abdomen: soft . Skin: no rash seen on limited exam . Musculoskeletal: not rigid . Psychiatric:unable to assess . Neurologic: no seizure no involuntary movements         Lab Data:   Basic Metabolic Panel: Recent Labs  Lab 02/01/19 0635 02/04/19 0510  NA 135 134*  K 3.5 4.2  CL 95* 92*  CO2 30 32  GLUCOSE 141* 127*  BUN 21* 20  CREATININE <0.30* 0.41*  CALCIUM 9.5 9.5  MG  --  2.0  PHOS  --  4.2    ABG: No results for input(s): PHART, PCO2ART, PO2ART, HCO3, O2SAT in the last 168 hours.  Liver Function Tests: No results for input(s): AST, ALT, ALKPHOS, BILITOT, PROT, ALBUMIN in the last 168 hours. No results for input(s): LIPASE, AMYLASE in the last 168 hours. No results for input(s): AMMONIA in the last 168 hours.  CBC: Recent Labs  Lab 02/01/19 0635 02/04/19 0510  WBC 9.4 8.5  NEUTROABS 6.8  --   HGB 9.2* 8.5*  HCT 30.1* 27.4*  MCV 92.6 91.6  PLT 322 427*    Cardiac Enzymes: No results for input(s): CKTOTAL, CKMB, CKMBINDEX, TROPONINI in the last 168  hours.  BNP (last 3 results) No results for input(s): BNP in the last 8760 hours.  ProBNP (last 3 results) No results for input(s): PROBNP in the last 8760 hours.  Radiological Exams: No results found.  Assessment/Plan Active Problems:   Acute on chronic respiratory failure with hypoxia (HCC)   Lobar pneumonia, unspecified organism (HCC)   Chronic congestive heart failure (HCC)   Closed C7 fracture without spinal cord injury, sequela   Pulmonary embolism and infarction ([place])   1. Acute on chronic respiratory failure hypoxia plan is to continue with capping trials with continue secretion management pulmonary toilet. 2. Lobar pneumonia treated we will continue present therapy 3. Chronic congestive heart failure at baseline 4. C7 fracture no changes noted 5. Pulmonary embolism treated we will continue to follow   I have personally seen and evaluated the patient, evaluated laboratory and imaging results, formulated the assessment and plan and placed orders. The Patient requires high complexity decision making for assessment and support.  Case was discussed on Rounds with the Respiratory Therapy Staff  Allyne Gee, MD [facility] Pulmonary Critical Care Medicine Sleep Medicine

## 2019-02-06 DIAGNOSIS — I509 Heart failure, unspecified: Secondary | ICD-10-CM | POA: Diagnosis not present

## 2019-02-06 DIAGNOSIS — S12690S Other displaced fracture of seventh cervical vertebra, sequela: Secondary | ICD-10-CM | POA: Diagnosis not present

## 2019-02-06 DIAGNOSIS — J181 Lobar pneumonia, unspecified organism: Secondary | ICD-10-CM | POA: Diagnosis not present

## 2019-02-06 DIAGNOSIS — J9621 Acute and chronic respiratory failure with hypoxia: Secondary | ICD-10-CM | POA: Diagnosis not present

## 2019-02-06 NOTE — Progress Notes (Signed)
Pulmonary Critical Care Medicine [place]   PULMONARY CRITICAL CARE SERVICE  PROGRESS NOTE  Date of Service: 02/06/2019  RYKER SUDBURY  [id number]  DOB: October 30, 1965   DOA: 12/23/2018  Referring Physician: Merton Border, MD  HPI: Raymond Avila is a 53 y.o. male seen for follow up of Acute on Chronic Respiratory Failure.  Patient currently is capping has been doing relatively well no major changes are noted at this time  Medications: Reviewed on Rounds  Physical Exam:  Vitals: Temperature 99.2 pulse 95 respiratory 19 blood pressure 146/78 saturations 98%  Ventilator Settings off the ventilator capping  . General: Comfortable at this time . Eyes: Grossly normal lids, irises & conjunctiva . ENT: grossly tongue is normal . Neck: no obvious mass . Cardiovascular: S1 S2 normal no gallop . Respiratory: No rhonchi no rales are noted at this time . Abdomen: soft . Skin: no rash seen on limited exam . Musculoskeletal: not rigid . Psychiatric:unable to assess . Neurologic: no seizure no involuntary movements         Lab Data:   Basic Metabolic Panel: Recent Labs  Lab 02/01/19 0635 02/04/19 0510  NA 135 134*  K 3.5 4.2  CL 95* 92*  CO2 30 32  GLUCOSE 141* 127*  BUN 21* 20  CREATININE <0.30* 0.41*  CALCIUM 9.5 9.5  MG  --  2.0  PHOS  --  4.2    ABG: No results for input(s): PHART, PCO2ART, PO2ART, HCO3, O2SAT in the last 168 hours.  Liver Function Tests: No results for input(s): AST, ALT, ALKPHOS, BILITOT, PROT, ALBUMIN in the last 168 hours. No results for input(s): LIPASE, AMYLASE in the last 168 hours. No results for input(s): AMMONIA in the last 168 hours.  CBC: Recent Labs  Lab 02/01/19 0635 02/04/19 0510  WBC 9.4 8.5  NEUTROABS 6.8  --   HGB 9.2* 8.5*  HCT 30.1* 27.4*  MCV 92.6 91.6  PLT 322 427*    Cardiac Enzymes: No results for input(s): CKTOTAL, CKMB, CKMBINDEX, TROPONINI in the last 168 hours.  BNP (last 3  results) No results for input(s): BNP in the last 8760 hours.  ProBNP (last 3 results) No results for input(s): PROBNP in the last 8760 hours.  Radiological Exams: No results found.  Assessment/Plan Active Problems:   Acute on chronic respiratory failure with hypoxia (HCC)   Lobar pneumonia, unspecified organism (HCC)   Chronic congestive heart failure (HCC)   Closed C7 fracture without spinal cord injury, sequela   Pulmonary embolism and infarction ([place])   1. Acute on chronic respiratory failure hypoxia patient continues with capping trials not decannulate at this time secondary to the cervical collar 2. Lobar pneumonia treated clinically is improved 3. Chronic congestive heart failure appears to be compensated 4. C7 fracture status post ACDF we will continue with supportive care 5. Pulmonary embolism treated we will continue supportive care   I have personally seen and evaluated the patient, evaluated laboratory and imaging results, formulated the assessment and plan and placed orders. The Patient requires high complexity decision making for assessment and support.  Case was discussed on Rounds with the Respiratory Therapy Staff  Allyne Gee, MD [facility] Pulmonary Critical Care Medicine Sleep Medicine

## 2019-02-07 DIAGNOSIS — S12690S Other displaced fracture of seventh cervical vertebra, sequela: Secondary | ICD-10-CM | POA: Diagnosis not present

## 2019-02-07 DIAGNOSIS — J9621 Acute and chronic respiratory failure with hypoxia: Secondary | ICD-10-CM | POA: Diagnosis not present

## 2019-02-07 DIAGNOSIS — I509 Heart failure, unspecified: Secondary | ICD-10-CM | POA: Diagnosis not present

## 2019-02-07 DIAGNOSIS — J181 Lobar pneumonia, unspecified organism: Secondary | ICD-10-CM | POA: Diagnosis not present

## 2019-02-07 NOTE — Progress Notes (Signed)
Pulmonary Critical Care Medicine [place]   PULMONARY CRITICAL CARE SERVICE  PROGRESS NOTE  Date of Service: 02/07/2019  Raymond Avila  [id number]  DOB: July 16, 1965   DOA: 12/23/2018  Referring Physician: Merton Border, MD  HPI: Raymond Avila is a 53 y.o. male seen for follow up of Acute on Chronic Respiratory Failure.  Patient currently is capping remains normal without any distress at this time  Medications: Reviewed on Rounds  Physical Exam:  Vitals: Temperature 98.6 pulse 86 respiratory rate 18 blood pressure is 120/60 saturations 97%  Ventilator Settings capping right now on room air off the vent  . General: Comfortable at this time . Eyes: Grossly normal lids, irises & conjunctiva . ENT: grossly tongue is normal . Neck: no obvious mass . Cardiovascular: S1 S2 normal no gallop . Respiratory: No rhonchi no rales are noted at this time . Abdomen: soft . Skin: no rash seen on limited exam . Musculoskeletal: not rigid . Psychiatric:unable to assess . Neurologic: no seizure no involuntary movements         Lab Data:   Basic Metabolic Panel: Recent Labs  Lab 02/01/19 0635 02/04/19 0510  NA 135 134*  K 3.5 4.2  CL 95* 92*  CO2 30 32  GLUCOSE 141* 127*  BUN 21* 20  CREATININE <0.30* 0.41*  CALCIUM 9.5 9.5  MG  --  2.0  PHOS  --  4.2    ABG: No results for input(s): PHART, PCO2ART, PO2ART, HCO3, O2SAT in the last 168 hours.  Liver Function Tests: No results for input(s): AST, ALT, ALKPHOS, BILITOT, PROT, ALBUMIN in the last 168 hours. No results for input(s): LIPASE, AMYLASE in the last 168 hours. No results for input(s): AMMONIA in the last 168 hours.  CBC: Recent Labs  Lab 02/01/19 0635 02/04/19 0510  WBC 9.4 8.5  NEUTROABS 6.8  --   HGB 9.2* 8.5*  HCT 30.1* 27.4*  MCV 92.6 91.6  PLT 322 427*    Cardiac Enzymes: No results for input(s): CKTOTAL, CKMB, CKMBINDEX, TROPONINI in the last 168 hours.  BNP (last 3  results) No results for input(s): BNP in the last 8760 hours.  ProBNP (last 3 results) No results for input(s): PROBNP in the last 8760 hours.  Radiological Exams: No results found.  Assessment/Plan Active Problems:   Acute on chronic respiratory failure with hypoxia (HCC)   Lobar pneumonia, unspecified organism (HCC)   Chronic congestive heart failure (HCC)   Closed C7 fracture without spinal cord injury, sequela   Pulmonary embolism and infarction ([place])   1. Acute on chronic respiratory failure hypoxia plan is to continue with capping trials oxygen as necessary 2. Lobar pneumonia treated improving clinically 3. C7 fracture status post ACDF still with an unstable neck 4. Chronic congestive heart failure compensated 5. Pulmonary embolism at baseline   I have personally seen and evaluated the patient, evaluated laboratory and imaging results, formulated the assessment and plan and placed orders. The Patient requires high complexity decision making for assessment and support.  Case was discussed on Rounds with the Respiratory Therapy Staff  Allyne Gee, MD [facility] Pulmonary Critical Care Medicine Sleep Medicine

## 2019-02-08 DIAGNOSIS — J181 Lobar pneumonia, unspecified organism: Secondary | ICD-10-CM | POA: Diagnosis not present

## 2019-02-08 DIAGNOSIS — S12690S Other displaced fracture of seventh cervical vertebra, sequela: Secondary | ICD-10-CM | POA: Diagnosis not present

## 2019-02-08 DIAGNOSIS — I509 Heart failure, unspecified: Secondary | ICD-10-CM | POA: Diagnosis not present

## 2019-02-08 DIAGNOSIS — J9621 Acute and chronic respiratory failure with hypoxia: Secondary | ICD-10-CM | POA: Diagnosis not present

## 2019-02-08 NOTE — Progress Notes (Signed)
Pulmonary Critical Care Medicine [place]   PULMONARY CRITICAL CARE SERVICE  PROGRESS NOTE  Date of Service: 02/08/2019  Raymond Avila  Raymond Avila  DOB: 1965-07-20   DOA: 12/23/2018  Referring Physician: Merton Border, MD  HPI: Raymond Avila is a 53 y.o. male seen for follow up of Acute on Chronic Respiratory Failure.  Patient currently is capping his been on room air no distress is noted at this time  Medications: Reviewed on Rounds  Physical Exam:  Vitals: Temperature 98.3 pulse 94 respiratory 25 blood pressure 120/64 saturations 99%  Ventilator Settings capping right now on room air  . General: Comfortable at this time . Eyes: Grossly normal lids, irises & conjunctiva . ENT: grossly tongue is normal . Neck: no obvious mass . Cardiovascular: S1 S2 normal no gallop . Respiratory: No rhonchi no rales are noted at this time . Abdomen: soft . Skin: no rash seen on limited exam . Musculoskeletal: not rigid . Psychiatric:unable to assess . Neurologic: no seizure no involuntary movements         Lab Data:   Basic Metabolic Panel: Recent Labs  Lab 02/04/19 0510  NA 134*  K 4.2  CL 92*  CO2 32  GLUCOSE 127*  BUN 20  CREATININE 0.41*  CALCIUM 9.5  MG 2.0  PHOS 4.2    ABG: No results for input(s): PHART, PCO2ART, PO2ART, HCO3, O2SAT in the last 168 hours.  Liver Function Tests: No results for input(s): AST, ALT, ALKPHOS, BILITOT, PROT, ALBUMIN in the last 168 hours. No results for input(s): LIPASE, AMYLASE in the last 168 hours. No results for input(s): AMMONIA in the last 168 hours.  CBC: Recent Labs  Lab 02/04/19 0510  WBC 8.5  HGB 8.5*  HCT 27.4*  MCV 91.6  PLT 427*    Cardiac Enzymes: No results for input(s): CKTOTAL, CKMB, CKMBINDEX, TROPONINI in the last 168 hours.  BNP (last 3 results) No results for input(s): BNP in the last 8760 hours.  ProBNP (last 3 results) No results for input(s): PROBNP in the last 8760  hours.  Radiological Exams: No results found.  Assessment/Plan Active Problems:   Acute on chronic respiratory failure with hypoxia (HCC)   Lobar pneumonia, unspecified organism (HCC)   Chronic congestive heart failure (HCC)   Closed C7 fracture without spinal cord injury, sequela   Pulmonary embolism and infarction ([place])   1. Acute on chronic respiratory failure hypoxia plan is to continue with capping no decannulation as planned 2. Lobar pneumonia treated clinically improving 3. Congestive heart failure at baseline 4. C7 fracture status post ACDF 5. Pulmonary embolism treated   I have personally seen and evaluated the patient, evaluated laboratory and imaging results, formulated the assessment and plan and placed orders. The Patient requires high complexity decision making for assessment and support.  Case was discussed on Rounds with the Respiratory Therapy Staff  Allyne Gee, MD [facility] Pulmonary Critical Care Medicine Sleep Medicine

## 2019-02-09 ENCOUNTER — Institutional Professional Consult (permissible substitution) (HOSPITAL_COMMUNITY): Payer: Medicare Other

## 2019-02-09 DIAGNOSIS — J9621 Acute and chronic respiratory failure with hypoxia: Secondary | ICD-10-CM | POA: Diagnosis not present

## 2019-02-09 DIAGNOSIS — J181 Lobar pneumonia, unspecified organism: Secondary | ICD-10-CM | POA: Diagnosis not present

## 2019-02-09 DIAGNOSIS — I509 Heart failure, unspecified: Secondary | ICD-10-CM | POA: Diagnosis not present

## 2019-02-09 DIAGNOSIS — S12690S Other displaced fracture of seventh cervical vertebra, sequela: Secondary | ICD-10-CM | POA: Diagnosis not present

## 2019-02-09 LAB — BASIC METABOLIC PANEL
Anion gap: 9 (ref 5–15)
BUN: 22 mg/dL — ABNORMAL HIGH (ref 6–20)
CO2: 29 mmol/L (ref 22–32)
Calcium: 9.6 mg/dL (ref 8.9–10.3)
Chloride: 95 mmol/L — ABNORMAL LOW (ref 98–111)
Creatinine, Ser: 0.37 mg/dL — ABNORMAL LOW (ref 0.61–1.24)
GFR calc Af Amer: >60 mL/min (ref >60)
GFR calc non Af Amer: >60 mL/min (ref >60)
Glucose, Bld: 145 mg/dL — ABNORMAL HIGH (ref 70–99)
Potassium: 3.7 mmol/L (ref 3.5–5.1)
Sodium: 133 mmol/L — ABNORMAL LOW (ref 135–145)

## 2019-02-09 LAB — CBC
HCT: 29.6 % — ABNORMAL LOW (ref 39.0–52.0)
Hemoglobin: 9.3 g/dL — ABNORMAL LOW (ref 13.0–17.0)
MCH: 28.4 pg (ref 26.0–34.0)
MCHC: 31.4 g/dL (ref 30.0–36.0)
MCV: 90.5 fL (ref 80.0–100.0)
Platelets: 446 10*3/uL — ABNORMAL HIGH (ref 150–400)
RBC: 3.27 MIL/uL — ABNORMAL LOW (ref 4.22–5.81)
RDW: 16.4 % — ABNORMAL HIGH (ref 11.5–15.5)
WBC: 11.5 10*3/uL — ABNORMAL HIGH (ref 4.0–10.5)
nRBC: 0 % (ref 0.0–0.2)

## 2019-02-09 LAB — MAGNESIUM: Magnesium: 2 mg/dL (ref 1.7–2.4)

## 2019-02-09 NOTE — Progress Notes (Signed)
Pulmonary Critical Care Medicine [place]   PULMONARY CRITICAL CARE SERVICE  PROGRESS NOTE  Date of Service: 02/09/2019  Raymond Avila  [id number]  DOB: 09-Jul-1965   DOA: 12/23/2018  Referring Physician: Merton Border, MD  HPI: Raymond Avila is a 53 y.o. male seen for follow up of Acute on Chronic Respiratory Failure.  Patient currently is capping doing fairly well.  Has had no issues with oxygenation so far.  Medications: Reviewed on Rounds  Physical Exam:  Vitals: Temperature 97.8 pulse 89 respiratory rate 20 blood pressure is 126/75 saturations 99%  Ventilator Settings capping off the ventilator  . General: Comfortable at this time . Eyes: Grossly normal lids, irises & conjunctiva . ENT: grossly tongue is normal . Neck: no obvious mass . Cardiovascular: S1 S2 normal no gallop . Respiratory: No rhonchi no rales are noted at this time . Abdomen: soft . Skin: no rash seen on limited exam . Musculoskeletal: not rigid . Psychiatric:unable to assess . Neurologic: no seizure no involuntary movements         Lab Data:   Basic Metabolic Panel: Recent Labs  Lab 02/04/19 0510 02/09/19 0508  NA 134* 133*  K 4.2 3.7  CL 92* 95*  CO2 32 29  GLUCOSE 127* 145*  BUN 20 22*  CREATININE 0.41* 0.37*  CALCIUM 9.5 9.6  MG 2.0 2.0  PHOS 4.2  --     ABG: No results for input(s): PHART, PCO2ART, PO2ART, HCO3, O2SAT in the last 168 hours.  Liver Function Tests: No results for input(s): AST, ALT, ALKPHOS, BILITOT, PROT, ALBUMIN in the last 168 hours. No results for input(s): LIPASE, AMYLASE in the last 168 hours. No results for input(s): AMMONIA in the last 168 hours.  CBC: Recent Labs  Lab 02/04/19 0510 02/09/19 0508  WBC 8.5 11.5*  HGB 8.5* 9.3*  HCT 27.4* 29.6*  MCV 91.6 90.5  PLT 427* 446*    Cardiac Enzymes: No results for input(s): CKTOTAL, CKMB, CKMBINDEX, TROPONINI in the last 168 hours.  BNP (last 3 results) No results for  input(s): BNP in the last 8760 hours.  ProBNP (last 3 results) No results for input(s): PROBNP in the last 8760 hours.  Radiological Exams: No results found.  Assessment/Plan Active Problems:   Acute on chronic respiratory failure with hypoxia (HCC)   Lobar pneumonia, unspecified organism (HCC)   Chronic congestive heart failure (HCC)   Closed C7 fracture without spinal cord injury, sequela   Pulmonary embolism and infarction ([place])   1. Acute on chronic respiratory failure hypoxia continue with capping trials titrate oxygen continue pulmonary toilet 2. Lobar pneumonia treated clinically improving we will continue with supportive care 3. Chronic congestive heart failure no change 4. C7 fracture we will continue present management 5. Pulmonary embolism treated   I have personally seen and evaluated the patient, evaluated laboratory and imaging results, formulated the assessment and plan and placed orders. The Patient requires high complexity decision making for assessment and support.  Case was discussed on Rounds with the Respiratory Therapy Staff  Allyne Gee, MD [facility] Pulmonary Critical Care Medicine Sleep Medicine

## 2019-02-10 DIAGNOSIS — S12690S Other displaced fracture of seventh cervical vertebra, sequela: Secondary | ICD-10-CM | POA: Diagnosis not present

## 2019-02-10 DIAGNOSIS — J9621 Acute and chronic respiratory failure with hypoxia: Secondary | ICD-10-CM | POA: Diagnosis not present

## 2019-02-10 DIAGNOSIS — I509 Heart failure, unspecified: Secondary | ICD-10-CM | POA: Diagnosis not present

## 2019-02-10 DIAGNOSIS — J181 Lobar pneumonia, unspecified organism: Secondary | ICD-10-CM | POA: Diagnosis not present

## 2019-02-10 LAB — URINE CULTURE
Culture: NO GROWTH
Special Requests: NORMAL

## 2019-02-10 LAB — URINALYSIS, ROUTINE W REFLEX MICROSCOPIC
Bacteria, UA: NONE SEEN
Bilirubin Urine: NEGATIVE
Glucose, UA: NEGATIVE mg/dL
Ketones, ur: NEGATIVE mg/dL
Leukocytes,Ua: NEGATIVE
Nitrite: NEGATIVE
Protein, ur: 30 mg/dL — AB
RBC / HPF: >50 RBC/hpf — ABNORMAL HIGH (ref 0–5)
Specific Gravity, Urine: 1.018 (ref 1.005–1.030)
pH: 9 — ABNORMAL HIGH (ref 5.0–8.0)

## 2019-02-10 NOTE — Progress Notes (Signed)
Pulmonary Critical Care Medicine [place]   PULMONARY CRITICAL CARE SERVICE  PROGRESS NOTE  Date of Service: 02/10/2019  Raymond Avila  [id number]:[id number]  DOB: [birth year]   DOA: 12/23/2018  Referring Physician: Merton Border, MD  HPI: Raymond Avila is a 53 y.o. male seen for follow up of Acute on Chronic Respiratory Failure.  Patient is capping right now is on room air without any distress  Medications: Reviewed on Rounds  Physical Exam:  Vitals: Temperature 96.7 pulse 89 respiratory 18 blood pressure is 139/63 saturations 100%  Ventilator Settings capping on room air off the ventilator  . General: Comfortable at this time . Eyes: Grossly normal lids, irises & conjunctiva . ENT: grossly tongue is normal . Neck: no obvious mass . Cardiovascular: S1 S2 normal no gallop . Respiratory: No rhonchi no rales are noted at this time . Abdomen: soft . Skin: no rash seen on limited exam . Musculoskeletal: not rigid . Psychiatric:unable to assess . Neurologic: no seizure no involuntary movements         Lab Data:   Basic Metabolic Panel: Recent Labs  Lab 02/04/19 0510 02/09/19 0508  NA 134* 133*  K 4.2 3.7  CL 92* 95*  CO2 32 29  GLUCOSE 127* 145*  BUN 20 22*  CREATININE 0.41* 0.37*  CALCIUM 9.5 9.6  MG 2.0 2.0  PHOS 4.2  --     ABG: No results for input(s): PHART, PCO2ART, PO2ART, HCO3, O2SAT in the last 168 hours.  Liver Function Tests: No results for input(s): AST, ALT, ALKPHOS, BILITOT, PROT, ALBUMIN in the last 168 hours. No results for input(s): LIPASE, AMYLASE in the last 168 hours. No results for input(s): AMMONIA in the last 168 hours.  CBC: Recent Labs  Lab 02/04/19 0510 02/09/19 0508  WBC 8.5 11.5*  HGB 8.5* 9.3*  HCT 27.4* 29.6*  MCV 91.6 90.5  PLT 427* 446*    Cardiac Enzymes: No results for input(s): CKTOTAL, CKMB, CKMBINDEX, TROPONINI in the last 168 hours.  BNP (last 3 results) No results for input(s): BNP in the  last 8760 hours.  ProBNP (last 3 results) No results for input(s): PROBNP in the last 8760 hours.  Radiological Exams: DG CHEST PORT 1 VIEW  Result Date: 02/09/2019 CLINICAL DATA:  Fever EXAM: PORTABLE CHEST 1 VIEW COMPARISON:  01/25/2019 FINDINGS: Tracheostomy tube remains in place. Heart size is enlarged. Low lung volumes. Streaky bibasilar opacities. No large pleural fluid collection. No pneumothorax. IMPRESSION: 1. Low lung volumes with streaky bibasilar opacities, findings may represent atelectasis and/or pneumonia. 2. Cardiomegaly. Electronically Signed   By: Davina Poke D.O.   On: 02/09/2019 15:15    Assessment/Plan Active Problems:   Acute on chronic respiratory failure with hypoxia (HCC)   Lobar pneumonia, unspecified organism (HCC)   Chronic congestive heart failure (HCC)   Closed C7 fracture without spinal cord injury, sequela   Pulmonary embolism and infarction ([place])   1. Acute on chronic respiratory failure with hypoxia the plan is to continue with capping trials will continue pulmonary toilet supportive care 2. Lobar pneumonia no changes are noted patient's x-ray shows some areas of atelectasis 3. Chronic congestive heart failure compensated 4. C7 fracture status post ACDF 5. Pulmonary embolism treated we'll continue present management   I have personally seen and evaluated the patient, evaluated laboratory and imaging results, formulated the assessment and plan and placed orders. The Patient requires high complexity decision making for assessment and support.  Rounds were done with  the Respiratory Therapy Director and Staff therapists and discussed with nursing staff also.  Allyne Gee, MD [facility] Pulmonary Critical Care Medicine Sleep Medicine

## 2019-02-11 DIAGNOSIS — J181 Lobar pneumonia, unspecified organism: Secondary | ICD-10-CM | POA: Diagnosis not present

## 2019-02-11 DIAGNOSIS — S12690S Other displaced fracture of seventh cervical vertebra, sequela: Secondary | ICD-10-CM | POA: Diagnosis not present

## 2019-02-11 DIAGNOSIS — J9621 Acute and chronic respiratory failure with hypoxia: Secondary | ICD-10-CM | POA: Diagnosis not present

## 2019-02-11 DIAGNOSIS — I509 Heart failure, unspecified: Secondary | ICD-10-CM | POA: Diagnosis not present

## 2019-02-11 NOTE — Progress Notes (Signed)
Pulmonary Critical Care Medicine [place]   PULMONARY CRITICAL CARE SERVICE  PROGRESS NOTE  Date of Service: 02/11/2019  Raymond Avila  [id number]:[id number]  DOB: 1965/04/01   DOA: 12/23/2018  Referring Physician: Merton Border, MD  HPI: Raymond Avila is a 53 y.o. male seen for follow up of Acute on Chronic Respiratory Failure.  Patient currently is capping without any distress seems to be tolerating it well  Medications: Reviewed on Rounds  Physical Exam:  Vitals: Temperature 97.6 pulse 88 respiratory rate 17 blood pressure 101/59 saturations 100%  Ventilator Settings capping currently off the ventilator  . General: Comfortable at this time . Eyes: Grossly normal lids, irises & conjunctiva . ENT: grossly tongue is normal . Neck: no obvious mass . Cardiovascular: S1 S2 normal no gallop . Respiratory: No rhonchi coarse breath sounds are noted . Abdomen: soft . Skin: no rash seen on limited exam . Musculoskeletal: not rigid . Psychiatric:unable to assess . Neurologic: no seizure no involuntary movements         Lab Data:   Basic Metabolic Panel: Recent Labs  Lab 02/09/19 0508  NA 133*  K 3.7  CL 95*  CO2 29  GLUCOSE 145*  BUN 22*  CREATININE 0.37*  CALCIUM 9.6  MG 2.0    ABG: No results for input(s): PHART, PCO2ART, PO2ART, HCO3, O2SAT in the last 168 hours.  Liver Function Tests: No results for input(s): AST, ALT, ALKPHOS, BILITOT, PROT, ALBUMIN in the last 168 hours. No results for input(s): LIPASE, AMYLASE in the last 168 hours. No results for input(s): AMMONIA in the last 168 hours.  CBC: Recent Labs  Lab 02/09/19 0508  WBC 11.5*  HGB 9.3*  HCT 29.6*  MCV 90.5  PLT 446*    Cardiac Enzymes: No results for input(s): CKTOTAL, CKMB, CKMBINDEX, TROPONINI in the last 168 hours.  BNP (last 3 results) No results for input(s): BNP in the last 8760 hours.  ProBNP (last 3 results) No results for input(s): PROBNP in the last 8760  hours.  Radiological Exams: No results found.  Assessment/Plan Active Problems:   Acute on chronic respiratory failure with hypoxia (HCC)   Lobar pneumonia, unspecified organism (HCC)   Chronic congestive heart failure (HCC)   Closed C7 fracture without spinal cord injury, sequela   Pulmonary embolism and infarction ([place])   1. Acute on chronic respiratory failure with hypoxia the plan is to continue with capping trials will continue secretion management supportive care 2. Lobar pneumonia treated clinically improved 3. Chronic congestive heart failure at baseline 4. C7 fracture no changes we will continue with supportive care 5. Pulmonary embolism treated improved   I have personally seen and evaluated the patient, evaluated laboratory and imaging results, formulated the assessment and plan and placed orders. The Patient requires high complexity decision making for assessment and support.  Rounds were done with the Respiratory Therapy Director and Staff therapists and discussed with nursing staff also.  Allyne Gee, MD [facility] Pulmonary Critical Care Medicine Sleep Medicine

## 2019-02-11 NOTE — Progress Notes (Signed)
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 He was admitted to outside hospital on 11/23/2018. He required anterior cervical disc fusion and despite this developed paraplegia. Postoperatively patient was intubated and had respiratory complications after that. He developed pneumonia secondary to Klebsiella. He had prolonged ventilation and eventually underwent tracheostomy placement. Patient also had DVT in right lower extremity and left upper extremity which led to pulmonary embolism. He was initially on heparin drip and then transitioned to Eliquis. He also had a feeding tube placed. Apparently posterior surgical fusion was postponed secondary to the patient's medical condition and inability to stay prone for the surgery because of the respiratory failure. Due to his medical problems [facility]. After admission here patient has been having fever spikes. All cultures to date negative. He was treated with IV vancomycin, meropenem, fluconazole.  However, continued to have fevers despite being on antibiotics.  Therefore antibiotics have been discontinued.  Thought to be neuroleptic malignant syndrome?  Due to psych medications.  His psych medications were adjusted.  After that patient started having diarrhea.  He has stool for C. difficile checked in the past and it was negative.  He was treated empirically with p.o. vancomycin.  Currently on Flagyl.  However, continuing to have diarrhea.  Afebrile at this time.  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  Assessment &  Plan:   Active Problems:   Acute on chronic respiratory failure with hypoxia (HCC)   Lobar pneumonia, unspecified organism (HCC)   Chronic congestive heart failure (HCC)   Closed C7 fracture without spinal cord injury, sequela   Pulmonary embolism and infarction (HCC) Diarrhea Right lower extremity/left upper extremity DVT Fevers COPD Obstructive sleep apnea Morbid obesity Protein calorie malnutrition Dysphagia History of schizoaffective disorder Sacrococcygeal pressure ulcer stage IV  Acute on chronic hypoxemic respiratory failure: Improving.  Previouschest x-ray showed mild right basilar atelectasis or infiltrate. He already received treatment with IV vancomycin, meropenem, fluconazole. PreviousTracheal aspirate cultures showing rare gram-positive rods, normal respiratory flora.   At this time his trach has been capped.  He appears to be stable on room air without any acute distress.  Pneumonia: Already treated with IV vancomycin, ertapenem, fluconazole.  Improving.  Pulmonary following.  He is stable at this time.  His trach has been capped. If his respiratory status worsens, would recommend CT imaging for better evaluation.   Fever: Patient was having persistent fever since his admission here despite being on antibiotics. Respiratory cultures showing normal flora. There was also concern for aspiration.  Fevers were thought to be either secondary to DVT/PE versus neurolept malignant syndrome.  His psychiatric medications were adjusted.  He was also treated with antimicrobials and then with p.o. vancomycin.  Currently on Flagyl.  At this time afebrile.  Diarrhea: In the past he stool for C. difficile was negative.  The diarrhea could be secondary to tube feeds?  He was already treated with p.o. vancomycin.  Currently on Flagyl.  He also received fiber but not helping.  At this time would recommend trial of cholestyramine.  Right lower extremity DVT/left upper extremity  DVT/pulmonary embolism: He was on IV heparin at  the outside facility now transitioned to Eliquis. Further management per the primary team.  C7 fracture status post ACDF surgery: Surgical site stable. Unfortunately has paraplegia with weakness. Continue supportive management per the primary team.  COPD: Continue medication and management per primary team and pulmonary.  Morbid obesity/obstructive sleep apnea: Patient admits to not using the CPAP. Continue management per primary team and pulmonary.  Protein calorie malnutrition: Continue management per primary team.  Dysphagia: Unfortunately due to his dysphagia he is high risk for aspiration and worsening respiratory failure secondary to aspiration pneumonia.  Schizoaffective disorder: Medications and management per the primary team.  Sacrococcygeal pressure ulcer: Continue local wound care.  He has drainage.  May need surgical evaluation.  Subjective: Having diarrhea.  He is afebrile at this time.  Objective: Vitals: Temperature 98, pulse 98, respiratory rate 16, blood pressure 125/74, pulse oximetry 100%  Examination: General exam: Morbidly obese male, awake, not in any acute distress  HEENT: Atraumatic, normocephalic, pupils equal and reactive, ear or nose lesions. Neck: Trach capped, has c-collar in place Respiratory system: Occasional rhonchi, no wheezing anteriorly Cardiovascular system: S1 & S2 heard, no murmur. Gastrointestinal system: Obese male, soft and nontender. Normal bowel sounds heard. Central nervous system: Weakness in all extremities Extremities: Edema lower extremity Skin: No rashes, sacrococcygeal pressure ulcer stage IV Psychiatry: Mood & affect appropriate.    Data Reviewed: I have personally reviewed following labs and imaging studies  CBC: Recent Labs  Lab 02/09/19 0508  WBC 11.5*  HGB 9.3*  HCT 29.6*  MCV 90.5  PLT 446*   Basic Metabolic Panel: Recent Labs  Lab 02/09/19 0508  NA  133*  K 3.7  CL 95*  CO2 29  GLUCOSE 145*  BUN 22*  CREATININE 0.37*  CALCIUM 9.6  MG 2.0   GFR: CrCl cannot be calculated (Unknown ideal weight.). Liver Function Tests: No results for input(s): AST, ALT, ALKPHOS, BILITOT, PROT, ALBUMIN in the last 168 hours. No results for input(s): LIPASE, AMYLASE in the last 168 hours. No results for input(s): AMMONIA in the last 168 hours. Coagulation Profile: No results for input(s): INR, PROTIME in the last 168 hours. Cardiac Enzymes: No results for input(s): CKTOTAL, CKMB, CKMBINDEX, TROPONINI in the last 168 hours. BNP (last 3 results) No results for input(s): PROBNP in the last 8760 hours. HbA1C: No results for input(s): HGBA1C in the last 72 hours. CBG: No results for input(s): GLUCAP in the last 168 hours. Lipid Profile: No results for input(s): CHOL, HDL, LDLCALC, TRIG, CHOLHDL, LDLDIRECT in the last 72 hours. Thyroid Function Tests: No results for input(s): TSH, T4TOTAL, FREET4, T3FREE, THYROIDAB in the last 72 hours. Anemia Panel: No results for input(s): VITAMINB12, FOLATE, FERRITIN, TIBC, IRON, RETICCTPCT in the last 72 hours. Sepsis Labs: No results for input(s): PROCALCITON, LATICACIDVEN in the last 168 hours.  Recent Results (from the past 240 hour(s))  Culture, Urine     Status: None   Collection Time: 02/10/19  4:00 AM   Specimen: Urine, Catheterized  Result Value Ref Range Status   Specimen Description URINE, CATHETERIZED  Final   Special Requests Normal  Final   Culture   Final    NO GROWTH Performed at [facility], [address], [place], [place] [address]    Report Status 02/10/2019 FINAL  Final         Radiology Studies: DG CHEST PORT 1 VIEW  Result Date: 02/09/2019 CLINICAL DATA:  Fever EXAM: PORTABLE CHEST 1 VIEW COMPARISON:  01/25/2019 FINDINGS:  Tracheostomy tube remains in place. Heart size is enlarged. Low lung volumes. Streaky bibasilar opacities. No large pleural fluid collection. No  pneumothorax. IMPRESSION: 1. Low lung volumes with streaky bibasilar opacities, findings may represent atelectasis and/or pneumonia. 2. Cardiomegaly. Electronically Signed   By: Davina Poke D.O.   On: 02/09/2019 15:15        Scheduled Meds: Please see MAR  Yaakov Guthrie, MD 02/11/2019, 12:54 PM

## 2019-02-12 DIAGNOSIS — J181 Lobar pneumonia, unspecified organism: Secondary | ICD-10-CM | POA: Diagnosis not present

## 2019-02-12 DIAGNOSIS — I509 Heart failure, unspecified: Secondary | ICD-10-CM | POA: Diagnosis not present

## 2019-02-12 DIAGNOSIS — S12690S Other displaced fracture of seventh cervical vertebra, sequela: Secondary | ICD-10-CM | POA: Diagnosis not present

## 2019-02-12 DIAGNOSIS — J9621 Acute and chronic respiratory failure with hypoxia: Secondary | ICD-10-CM | POA: Diagnosis not present

## 2019-02-12 LAB — CBC
HCT: 30.4 % — ABNORMAL LOW (ref 39.0–52.0)
Hemoglobin: 9.6 g/dL — ABNORMAL LOW (ref 13.0–17.0)
MCH: 28.7 pg (ref 26.0–34.0)
MCHC: 31.6 g/dL (ref 30.0–36.0)
MCV: 91 fL (ref 80.0–100.0)
Platelets: 363 10*3/uL (ref 150–400)
RBC: 3.34 MIL/uL — ABNORMAL LOW (ref 4.22–5.81)
RDW: 16.3 % — ABNORMAL HIGH (ref 11.5–15.5)
WBC: 9.3 10*3/uL (ref 4.0–10.5)
nRBC: 0 % (ref 0.0–0.2)

## 2019-02-12 LAB — BASIC METABOLIC PANEL
Anion gap: 12 (ref 5–15)
BUN: 16 mg/dL (ref 6–20)
CO2: 24 mmol/L (ref 22–32)
Calcium: 9.5 mg/dL (ref 8.9–10.3)
Chloride: 97 mmol/L — ABNORMAL LOW (ref 98–111)
Creatinine, Ser: <0.3 mg/dL — ABNORMAL LOW (ref 0.61–1.24)
Glucose, Bld: 96 mg/dL (ref 70–99)
Potassium: 3.9 mmol/L (ref 3.5–5.1)
Sodium: 133 mmol/L — ABNORMAL LOW (ref 135–145)

## 2019-02-12 LAB — MAGNESIUM: Magnesium: 1.9 mg/dL (ref 1.7–2.4)

## 2019-02-12 NOTE — Progress Notes (Signed)
Pulmonary Critical Care Medicine [facility] [place]   PULMONARY CRITICAL CARE SERVICE  PROGRESS NOTE  Date of Service: 02/12/2019  Raymond Avila  [id number]  DOB: 09-03-1965   DOA: 12/23/2018  Referring Physician: [name], MD  HPI: Raymond Avila is a 54 y.o. male seen for follow up of Acute on Chronic Respiratory Failure.  Patient remains capping without any distress now is on room air.  Respiratory therapy reports that patient has minimal secretions noted and is not requiring frequent suctioning.  Medications: Reviewed on Rounds  Physical Exam:  Vitals: Temperature 98.2 pulse 80 respiratory 19 blood pressure 101/59 saturations 96%  Ventilator Settings capping off the ventilator  . General: Comfortable at this time . Eyes: Grossly normal lids, irises & conjunctiva . ENT: grossly tongue is normal . Neck: no obvious mass . Cardiovascular: S1 S2 normal no gallop . Respiratory: No rhonchi no rales are noted at this time . Abdomen: soft . Skin: no rash seen on limited exam . Musculoskeletal: not rigid . Psychiatric:unable to assess . Neurologic: no seizure no involuntary movements         Lab Data:   Basic Metabolic Panel: Recent Labs  Lab 02/09/19 0508 02/12/19 0529  NA 133* 133*  K 3.7 3.9  CL 95* 97*  CO2 29 24  GLUCOSE 145* 96  BUN 22* 16  CREATININE 0.37* <0.30*  CALCIUM 9.6 9.5  MG 2.0 1.9    ABG: No results for input(s): PHART, PCO2ART, PO2ART, HCO3, O2SAT in the last 168 hours.  Liver Function Tests: No results for input(s): AST, ALT, ALKPHOS, BILITOT, PROT, ALBUMIN in the last 168 hours. No results for input(s): LIPASE, AMYLASE in the last 168 hours. No results for input(s): AMMONIA in the last 168 hours.  CBC: Recent Labs  Lab 02/09/19 0508 02/12/19 0529  WBC 11.5* 9.3  HGB 9.3* 9.6*  HCT 29.6* 30.4*  MCV 90.5 91.0  PLT 446* 363    Cardiac Enzymes: No results for input(s): CKTOTAL, CKMB, CKMBINDEX, TROPONINI in the  last 168 hours.  BNP (last 3 results) No results for input(s): BNP in the last 8760 hours.  ProBNP (last 3 results) No results for input(s): PROBNP in the last 8760 hours.  Radiological Exams: No results found.  Assessment/Plan Active Problems:   Acute on chronic respiratory failure with hypoxia (HCC)   Lobar pneumonia, unspecified organism (HCC)   Chronic congestive heart failure (HCC)   Closed C7 fracture without spinal cord injury, sequela   Pulmonary embolism and infarction (HCC)   1. Acute on chronic respiratory failure hypoxia plan is to continue with capping I will consider decannulation if we continue to get reports of no secretions. 2. Lobar pneumonia treated clinically resolved 3. Chronic congestive heart failure monitor fluid status currently is compensated 4. C7 fracture status post ACDF 5. Pulmonary embolism treated anticoagulation   I have personally seen and evaluated the patient, evaluated laboratory and imaging results, formulated the assessment and plan and placed orders. The Patient requires high complexity decision making for assessment and support.  Rounds were done with the Respiratory Therapy Director and Staff therapists and discussed with nursing staff also.  Yevonne Pax, MD [facility] Pulmonary Critical Care Medicine Sleep Medicine

## 2019-02-13 DIAGNOSIS — S12690S Other displaced fracture of seventh cervical vertebra, sequela: Secondary | ICD-10-CM | POA: Diagnosis not present

## 2019-02-13 DIAGNOSIS — I509 Heart failure, unspecified: Secondary | ICD-10-CM | POA: Diagnosis not present

## 2019-02-13 DIAGNOSIS — J181 Lobar pneumonia, unspecified organism: Secondary | ICD-10-CM | POA: Diagnosis not present

## 2019-02-13 DIAGNOSIS — J9621 Acute and chronic respiratory failure with hypoxia: Secondary | ICD-10-CM | POA: Diagnosis not present

## 2019-02-13 NOTE — Progress Notes (Signed)
Pulmonary Critical Care Medicine [facility] [place]   PULMONARY CRITICAL CARE SERVICE  PROGRESS NOTE  Date of Service: 02/13/2019  Raymond Avila  [id number]  DOB: February 03, 1966   DOA: 12/23/2018  Referring Physician: [name], MD  HPI: Raymond Avila is a 54 y.o. male seen for follow up of Acute on Chronic Respiratory Failure.  Patient is capping doing well secretions are still reported today as being significant  Medications: Reviewed on Rounds  Physical Exam:  Vitals: Temperature 100.4 pulse 108 respiratory rate 30 blood pressure is 130/68 saturations 93%  Ventilator Settings capping currently on room air  . General: Comfortable at this time . Eyes: Grossly normal lids, irises & conjunctiva . ENT: grossly tongue is normal . Neck: no obvious mass . Cardiovascular: S1 S2 normal no gallop . Respiratory: No rhonchi no rales noted at this time . Abdomen: soft . Skin: no rash seen on limited exam . Musculoskeletal: not rigid . Psychiatric:unable to assess . Neurologic: no seizure no involuntary movements         Lab Data:   Basic Metabolic Panel: Recent Labs  Lab 02/09/19 0508 02/12/19 0529  NA 133* 133*  K 3.7 3.9  CL 95* 97*  CO2 29 24  GLUCOSE 145* 96  BUN 22* 16  CREATININE 0.37* <0.30*  CALCIUM 9.6 9.5  MG 2.0 1.9    ABG: No results for input(s): PHART, PCO2ART, PO2ART, HCO3, O2SAT in the last 168 hours.  Liver Function Tests: No results for input(s): AST, ALT, ALKPHOS, BILITOT, PROT, ALBUMIN in the last 168 hours. No results for input(s): LIPASE, AMYLASE in the last 168 hours. No results for input(s): AMMONIA in the last 168 hours.  CBC: Recent Labs  Lab 02/09/19 0508 02/12/19 0529  WBC 11.5* 9.3  HGB 9.3* 9.6*  HCT 29.6* 30.4*  MCV 90.5 91.0  PLT 446* 363    Cardiac Enzymes: No results for input(s): CKTOTAL, CKMB, CKMBINDEX, TROPONINI in the last 168 hours.  BNP (last 3 results) No results for input(s): BNP in the last  8760 hours.  ProBNP (last 3 results) No results for input(s): PROBNP in the last 8760 hours.  Radiological Exams: No results found.  Assessment/Plan Active Problems:   Acute on chronic respiratory failure with hypoxia (HCC)   Lobar pneumonia, unspecified organism (HCC)   Chronic congestive heart failure (HCC)   Closed C7 fracture without spinal cord injury, sequela   Pulmonary embolism and infarction (HCC)   1. Acute on chronic respiratory failure with hypoxia we will continue with capping trials patient's recent secretions have increased.  The patient will be held up as far as being able to decannulate because of the secretion issues. 2. Lobar pneumonia patient now has low-grade fever which suggest follow-up chest x-ray 3. Chronic congestive heart failure appears to be compensated 4. C7 fracture status post ACDF 5. Pulmonary embolism treated we will continue present management supportive care   I have personally seen and evaluated the patient, evaluated laboratory and imaging results, formulated the assessment and plan and placed orders. The Patient requires high complexity decision making with multiple systems involvement.  Rounds were done with the Respiratory Therapy Director and Staff therapists and discussed with nursing staff also.  Yevonne Pax, MD [facility] Pulmonary Critical Care Medicine Sleep Medicine

## 2019-02-14 ENCOUNTER — Other Ambulatory Visit (HOSPITAL_COMMUNITY): Payer: Medicare Other

## 2019-02-14 DIAGNOSIS — S12690S Other displaced fracture of seventh cervical vertebra, sequela: Secondary | ICD-10-CM | POA: Diagnosis not present

## 2019-02-14 DIAGNOSIS — I509 Heart failure, unspecified: Secondary | ICD-10-CM | POA: Diagnosis not present

## 2019-02-14 DIAGNOSIS — J9621 Acute and chronic respiratory failure with hypoxia: Secondary | ICD-10-CM | POA: Diagnosis not present

## 2019-02-14 DIAGNOSIS — J181 Lobar pneumonia, unspecified organism: Secondary | ICD-10-CM | POA: Diagnosis not present

## 2019-02-14 LAB — CBC
HCT: 29 % — ABNORMAL LOW (ref 39.0–52.0)
Hemoglobin: 8.8 g/dL — ABNORMAL LOW (ref 13.0–17.0)
MCH: 28.7 pg (ref 26.0–34.0)
MCHC: 30.3 g/dL (ref 30.0–36.0)
MCV: 94.5 fL (ref 80.0–100.0)
Platelets: 385 10*3/uL (ref 150–400)
RBC: 3.07 MIL/uL — ABNORMAL LOW (ref 4.22–5.81)
RDW: 16.9 % — ABNORMAL HIGH (ref 11.5–15.5)
WBC: 21.7 10*3/uL — ABNORMAL HIGH (ref 4.0–10.5)
nRBC: 0 % (ref 0.0–0.2)

## 2019-02-14 LAB — URINALYSIS, ROUTINE W REFLEX MICROSCOPIC
Bacteria, UA: NONE SEEN
Bilirubin Urine: NEGATIVE
Glucose, UA: NEGATIVE mg/dL
Ketones, ur: NEGATIVE mg/dL
Leukocytes,Ua: NEGATIVE
Nitrite: NEGATIVE
Protein, ur: NEGATIVE mg/dL
Specific Gravity, Urine: 1.011 (ref 1.005–1.030)
pH: 5 (ref 5.0–8.0)

## 2019-02-14 LAB — BASIC METABOLIC PANEL
Anion gap: 8 (ref 5–15)
BUN: 22 mg/dL — ABNORMAL HIGH (ref 6–20)
CO2: 27 mmol/L (ref 22–32)
Calcium: 9.5 mg/dL (ref 8.9–10.3)
Chloride: 100 mmol/L (ref 98–111)
Creatinine, Ser: 0.36 mg/dL — ABNORMAL LOW (ref 0.61–1.24)
GFR calc Af Amer: >60 mL/min (ref >60)
GFR calc non Af Amer: >60 mL/min (ref >60)
Glucose, Bld: 158 mg/dL — ABNORMAL HIGH (ref 70–99)
Potassium: 3.9 mmol/L (ref 3.5–5.1)
Sodium: 135 mmol/L (ref 135–145)

## 2019-02-14 LAB — MAGNESIUM: Magnesium: 2 mg/dL (ref 1.7–2.4)

## 2019-02-14 NOTE — Progress Notes (Addendum)
Pulmonary Critical Care Medicine [place]   PULMONARY CRITICAL CARE SERVICE  PROGRESS NOTE  Date of Service: 02/14/2019  Raymond Avila  [id number]  DOB: 10/09/1965   DOA: 12/23/2018  Referring Physician: Merton Border, MD  HPI: Raymond Avila is a 54 y.o. male seen for follow up of Acute on Chronic Respiratory Failure.  Patient has had a change in status apparently spiked a fever temperature was up to 102.4 was also somewhat tachycardic.  There is also a question of possibility of pneumonia.  Medications: Reviewed on Rounds  Physical Exam:  Vitals: Temperature 102.4 pulse 106 respiratory rate 26 blood pressure 120/64 saturations 95%  Ventilator Settings capping right now on 2 L  . General: Comfortable at this time . Eyes: Grossly normal lids, irises & conjunctiva . ENT: grossly tongue is normal . Neck: no obvious mass . Cardiovascular: S1 S2 normal no gallop . Respiratory: Scattered rhonchi expansion is equal at this time . Abdomen: soft . Skin: no rash seen on limited exam . Musculoskeletal: not rigid . Psychiatric:unable to assess . Neurologic: no seizure no involuntary movements         Lab Data:   Basic Metabolic Panel: Recent Labs  Lab 02/09/19 0508 02/12/19 0529 02/14/19 1035  NA 133* 133* 135  K 3.7 3.9 3.9  CL 95* 97* 100  CO2 29 24 27   GLUCOSE 145* 96 158*  BUN 22* 16 22*  CREATININE 0.37* <0.30* 0.36*  CALCIUM 9.6 9.5 9.5  MG 2.0 1.9 2.0    ABG: No results for input(s): PHART, PCO2ART, PO2ART, HCO3, O2SAT in the last 168 hours.  Liver Function Tests: No results for input(s): AST, ALT, ALKPHOS, BILITOT, PROT, ALBUMIN in the last 168 hours. No results for input(s): LIPASE, AMYLASE in the last 168 hours. No results for input(s): AMMONIA in the last 168 hours.  CBC: Recent Labs  Lab 02/09/19 0508 02/12/19 0529 02/14/19 1035  WBC 11.5* 9.3 21.7*  HGB 9.3* 9.6* 8.8*  HCT 29.6* 30.4* 29.0*  MCV 90.5 91.0 94.5  PLT  446* 363 385    Cardiac Enzymes: No results for input(s): CKTOTAL, CKMB, CKMBINDEX, TROPONINI in the last 168 hours.  BNP (last 3 results) No results for input(s): BNP in the last 8760 hours.  ProBNP (last 3 results) No results for input(s): PROBNP in the last 8760 hours.  Radiological Exams: DG CHEST PORT 1 VIEW  Result Date: 02/14/2019 CLINICAL DATA:  High temperature. EXAM: PORTABLE CHEST 1 VIEW COMPARISON:  Radiograph 02/09/2019 FINDINGS: Tracheostomy tube tip at the thoracic inlet. Multiple overlying monitoring devices partially obscure evaluation particularly of the right chest. Streaky opacities in the left mid lung, with patchy bilateral lower lung zones. Cardiomegaly. No evidence of pneumothorax or large pleural effusion. Unchanged elevation of right hemidiaphragm. IMPRESSION: 1. Streaky opacities in the left mid lung and patchy bibasilar opacities, atelectasis versus pneumonia in the setting of fever. 2. Unchanged cardiomegaly. 3. Tracheostomy tube tip at the thoracic inlet. Electronically Signed   By: Keith Rake M.D.   On: 02/14/2019 06:35    Assessment/Plan Active Problems:   Acute on chronic respiratory failure with hypoxia (HCC)   Lobar pneumonia, unspecified organism (HCC)   Chronic congestive heart failure (HCC)   Closed C7 fracture without spinal cord injury, sequela   Pulmonary embolism and infarction ([place])   1. Acute on chronic respiratory failure hypoxia the plan is to continue with capping for now unless the patient decompensates.  Chest x-ray showing some streaky  opacities consistent with pneumonia along with the fever.  Will review antibiotics with primary care team 2. Lobar pneumonia patient has some worsening of chest x-ray 3. Chronic congestive heart failure at baseline continue with supportive care 4. C7 fracture no changes 5. Pulmonary embolism treated clinically is improving   I have personally seen and evaluated the patient, evaluated laboratory  and imaging results, formulated the assessment and plan and placed orders. The Patient requires high complexity decision making with multiple systems involvement.  Rounds were done with the Respiratory Therapy Director and Staff therapists and discussed with nursing staff also.  Yevonne Pax, MD [facility] Pulmonary Critical Care Medicine Sleep Medicine

## 2019-02-15 ENCOUNTER — Other Ambulatory Visit (HOSPITAL_COMMUNITY): Payer: Medicare Other

## 2019-02-15 DIAGNOSIS — J9621 Acute and chronic respiratory failure with hypoxia: Secondary | ICD-10-CM | POA: Diagnosis not present

## 2019-02-15 DIAGNOSIS — I509 Heart failure, unspecified: Secondary | ICD-10-CM | POA: Diagnosis not present

## 2019-02-15 DIAGNOSIS — J181 Lobar pneumonia, unspecified organism: Secondary | ICD-10-CM | POA: Diagnosis not present

## 2019-02-15 DIAGNOSIS — S12690S Other displaced fracture of seventh cervical vertebra, sequela: Secondary | ICD-10-CM | POA: Diagnosis not present

## 2019-02-15 LAB — URINE CULTURE: Culture: NO GROWTH

## 2019-02-15 MED ORDER — IOHEXOL 300 MG/ML  SOLN
100.0000 mL | Freq: Once | INTRAMUSCULAR | Status: AC | PRN
  Administered 2019-02-15: 100 mL via INTRAVENOUS

## 2019-02-15 NOTE — Progress Notes (Addendum)
Pulmonary Critical Care Medicine [facility] [place]   PULMONARY CRITICAL CARE SERVICE  PROGRESS NOTE  Date of Service: 02/15/2019  Raymond Avila  [id number]  DOB: 1965-06-03   DOA: 12/23/2018  Referring Physician: [name], MD  HPI: Raymond Avila is a 54 y.o. male seen for follow up of Acute on Chronic Respiratory Failure.  Patient is A we were considering decannulation however he is now has increased secretions also has been having a temperature overnight was having low-grade fevers this morning is also having low-grade temperature again so for holding off on capping.  Medications: Reviewed on Rounds  Physical Exam:  Vitals: Temperature 98.9 pulse 99 respiratory 24 blood pressure is 152/81 saturations 95%  Ventilator Settings capping right now off the ventilator  . General: Comfortable at this time . Eyes: Grossly normal lids, irises & conjunctiva . ENT: grossly tongue is normal . Neck: no obvious mass . Cardiovascular: S1 S2 normal no gallop . Respiratory: No rhonchi no rales are noted at this time . Abdomen: soft . Skin: no rash seen on limited exam . Musculoskeletal: not rigid . Psychiatric:unable to assess . Neurologic: no seizure no involuntary movements         Lab Data:   Basic Metabolic Panel: Recent Labs  Lab 02/09/19 0508 02/12/19 0529 02/14/19 1035  NA 133* 133* 135  K 3.7 3.9 3.9  CL 95* 97* 100  CO2 29 24 27   GLUCOSE 145* 96 158*  BUN 22* 16 22*  CREATININE 0.37* <0.30* 0.36*  CALCIUM 9.6 9.5 9.5  MG 2.0 1.9 2.0    ABG: No results for input(s): PHART, PCO2ART, PO2ART, HCO3, O2SAT in the last 168 hours.  Liver Function Tests: No results for input(s): AST, ALT, ALKPHOS, BILITOT, PROT, ALBUMIN in the last 168 hours. No results for input(s): LIPASE, AMYLASE in the last 168 hours. No results for input(s): AMMONIA in the last 168 hours.  CBC: Recent Labs  Lab 02/09/19 0508 02/12/19 0529 02/14/19 1035  WBC 11.5* 9.3  21.7*  HGB 9.3* 9.6* 8.8*  HCT 29.6* 30.4* 29.0*  MCV 90.5 91.0 94.5  PLT 446* 363 385    Cardiac Enzymes: No results for input(s): CKTOTAL, CKMB, CKMBINDEX, TROPONINI in the last 168 hours.  BNP (last 3 results) No results for input(s): BNP in the last 8760 hours.  ProBNP (last 3 results) No results for input(s): PROBNP in the last 8760 hours.  Radiological Exams: CT CHEST W CONTRAST  Result Date: 02/15/2019 CLINICAL DATA:  Pneumonia, fever and sacral decubitus ulcer. EXAM: CT CHEST, ABDOMEN, AND PELVIS WITH CONTRAST TECHNIQUE: Multidetector CT imaging of the chest, abdomen and pelvis was performed following the standard protocol during bolus administration of intravenous contrast. CONTRAST:  04/15/2019 OMNIPAQUE IOHEXOL 300 MG/ML  SOLN COMPARISON:  CT of the pelvis on 01/29/2019 FINDINGS: CT CHEST FINDINGS Cardiovascular: The heart size is normal. No pericardial fluid identified. The thoracic aorta is normal in caliber. Central pulmonary arteries are normal in caliber. Mediastinum/Nodes: No enlarged mediastinal, hilar, or axillary lymph nodes. Thyroid gland, trachea, and esophagus demonstrate no significant findings. Lungs/Pleura: Tracheostomy present. Dense consolidation of the left lower lobe likely representing pneumonia with associated trace left pleural fluid. The left lower lobe bronchus appears narrowed and may contain debris/mucous. Underlying endobronchial lesion cannot be excluded by current CT. Patchy airspace disease also present in the left upper lobe. Mild ground-glass airspace disease in the right upper lobe and atelectasis in the right lower lobe. No pneumothorax. Musculoskeletal: No chest wall  mass or suspicious bone lesions identified. CT ABDOMEN PELVIS FINDINGS Hepatobiliary: The liver appears unremarkable. The gallbladder is contracted. No biliary ductal dilatation. Pancreas: Unremarkable. No pancreatic ductal dilatation or surrounding inflammatory changes. Spleen: Normal in size  without focal abnormality. Adrenals/Urinary Tract: Adrenal glands are unremarkable. Kidneys are normal, without renal calculi, focal lesion, or hydronephrosis. Bladder is decompressed by a Foley catheter. Stomach/Bowel: Rectal tube present. Bowel shows no evidence of obstruction, ileus or inflammation. Gastrostomy tube present entering the mid body of the stomach and in appropriate position. No free intraperitoneal air. Vascular/Lymphatic: No significant vascular findings are present. No enlarged abdominal or pelvic lymph nodes. Reproductive: Prostate is unremarkable. Other: Progression of sacral decubitus ulcer which is at least 2 cm in depth. Soft tissue defect nearly contacts the lower coccyx. No evidence of focal abscess or additional soft tissue gas. Musculoskeletal: No bony destruction or fractures identified. IMPRESSION: 1. Dense consolidation of the left lower lobe likely representing pneumonia with associated trace left pleural fluid. The left lower lobe bronchus appears narrowed and may contain debris/mucous. Underlying endobronchial lesion cannot be excluded by current CT. 2. Milder airspace disease in both upper lobes. 3. Progression of sacral decubitus ulcer without evidence of focal abscess or additional soft tissue gas. Electronically Signed   By: Aletta Edouard M.D.   On: 02/15/2019 12:53   CT ABDOMEN PELVIS W CONTRAST  Result Date: 02/15/2019 CLINICAL DATA:  Pneumonia, fever and sacral decubitus ulcer. EXAM: CT CHEST, ABDOMEN, AND PELVIS WITH CONTRAST TECHNIQUE: Multidetector CT imaging of the chest, abdomen and pelvis was performed following the standard protocol during bolus administration of intravenous contrast. CONTRAST:  119mL OMNIPAQUE IOHEXOL 300 MG/ML  SOLN COMPARISON:  CT of the pelvis on 01/29/2019 FINDINGS: CT CHEST FINDINGS Cardiovascular: The heart size is normal. No pericardial fluid identified. The thoracic aorta is normal in caliber. Central pulmonary arteries are normal in  caliber. Mediastinum/Nodes: No enlarged mediastinal, hilar, or axillary lymph nodes. Thyroid gland, trachea, and esophagus demonstrate no significant findings. Lungs/Pleura: Tracheostomy present. Dense consolidation of the left lower lobe likely representing pneumonia with associated trace left pleural fluid. The left lower lobe bronchus appears narrowed and may contain debris/mucous. Underlying endobronchial lesion cannot be excluded by current CT. Patchy airspace disease also present in the left upper lobe. Mild ground-glass airspace disease in the right upper lobe and atelectasis in the right lower lobe. No pneumothorax. Musculoskeletal: No chest wall mass or suspicious bone lesions identified. CT ABDOMEN PELVIS FINDINGS Hepatobiliary: The liver appears unremarkable. The gallbladder is contracted. No biliary ductal dilatation. Pancreas: Unremarkable. No pancreatic ductal dilatation or surrounding inflammatory changes. Spleen: Normal in size without focal abnormality. Adrenals/Urinary Tract: Adrenal glands are unremarkable. Kidneys are normal, without renal calculi, focal lesion, or hydronephrosis. Bladder is decompressed by a Foley catheter. Stomach/Bowel: Rectal tube present. Bowel shows no evidence of obstruction, ileus or inflammation. Gastrostomy tube present entering the mid body of the stomach and in appropriate position. No free intraperitoneal air. Vascular/Lymphatic: No significant vascular findings are present. No enlarged abdominal or pelvic lymph nodes. Reproductive: Prostate is unremarkable. Other: Progression of sacral decubitus ulcer which is at least 2 cm in depth. Soft tissue defect nearly contacts the lower coccyx. No evidence of focal abscess or additional soft tissue gas. Musculoskeletal: No bony destruction or fractures identified. IMPRESSION: 1. Dense consolidation of the left lower lobe likely representing pneumonia with associated trace left pleural fluid. The left lower lobe bronchus  appears narrowed and may contain debris/mucous. Underlying endobronchial lesion cannot be excluded  by current CT. 2. Milder airspace disease in both upper lobes. 3. Progression of sacral decubitus ulcer without evidence of focal abscess or additional soft tissue gas. Electronically Signed   By: Irish Lack M.D.   On: 02/15/2019 12:53   DG CHEST PORT 1 VIEW  Result Date: 02/14/2019 CLINICAL DATA:  High temperature. EXAM: PORTABLE CHEST 1 VIEW COMPARISON:  Radiograph 02/09/2019 FINDINGS: Tracheostomy tube tip at the thoracic inlet. Multiple overlying monitoring devices partially obscure evaluation particularly of the right chest. Streaky opacities in the left mid lung, with patchy bilateral lower lung zones. Cardiomegaly. No evidence of pneumothorax or large pleural effusion. Unchanged elevation of right hemidiaphragm. IMPRESSION: 1. Streaky opacities in the left mid lung and patchy bibasilar opacities, atelectasis versus pneumonia in the setting of fever. 2. Unchanged cardiomegaly. 3. Tracheostomy tube tip at the thoracic inlet. Electronically Signed   By: Narda Rutherford M.D.   On: 02/14/2019 06:35    Assessment/Plan Active Problems:   Acute on chronic respiratory failure with hypoxia (HCC)   Lobar pneumonia, unspecified organism (HCC)   Chronic congestive heart failure (HCC)   Closed C7 fracture without spinal cord injury, sequela   Pulmonary embolism and infarction (HCC)   1. Acute on chronic respiratory hypoxia plan is to continue with capping trials for now we will hold off on doing any decannulation.  Chest x-ray was done which did show some streaky changes consideration of atelectasis.  Patient is on antibiotics we will continue to monitor however right now still with low-grade fevers follow-up on cultures 2. Lobar pneumonia patient had temperature follow-up on cultures and adjust antibiotics accordingly. 3. Chronic congestive heart failure right now is compensated we will continue with  present management 4. C7 fracture without any change at this time status post ACDF 5. Pulmonary embolism treated we will continue to follow along   I have personally seen and evaluated the patient, evaluated laboratory and imaging results, formulated the assessment and plan and placed orders. The Patient requires high complexity decision making with multiple systems involvement.  Rounds were done with the Respiratory Therapy Director and Staff therapists and discussed with nursing staff also.  Yevonne Pax, MD [facility] Pulmonary Critical Care Medicine Sleep Medicine

## 2019-02-16 DIAGNOSIS — I509 Heart failure, unspecified: Secondary | ICD-10-CM | POA: Diagnosis not present

## 2019-02-16 DIAGNOSIS — J9621 Acute and chronic respiratory failure with hypoxia: Secondary | ICD-10-CM | POA: Diagnosis not present

## 2019-02-16 DIAGNOSIS — J9811 Atelectasis: Secondary | ICD-10-CM

## 2019-02-16 DIAGNOSIS — R509 Fever, unspecified: Secondary | ICD-10-CM | POA: Diagnosis not present

## 2019-02-16 LAB — VANCOMYCIN, TROUGH: Vancomycin Tr: 16 ug/mL (ref 15–20)

## 2019-02-16 NOTE — Progress Notes (Signed)
Pulmonary Critical Care Medicine [facility] [place]   PULMONARY CRITICAL CARE SERVICE  PROGRESS NOTE  Date of Service: 02/16/2019  SOCORRO EBRON  YOV:[id number]  DOB: 05/01/1965   DOA: 12/23/2018  Referring Physician: [name], MD  HPI: Raymond Avila is a 54 y.o. male seen for follow up of Acute on Chronic Respiratory Failure.  Patient is on T collar still respiratory reports that he has copious amounts of secretions.  Still continues to have a fever temperature was up to 101.2 and patient is not able to handle the secretions again.  As previously mentioned we were considering doing decannulation on him however this is all been now put on hold because of the fever and change in condition  Medications: Reviewed on Rounds  Physical Exam:  Vitals: Temperature one 1.2 pulse 103 respiratory rate 22 blood pressure 126/72 saturations 97%  Ventilator Settings off the ventilator on T collar currently on 28% FiO2  . General: Comfortable at this time . Eyes: Grossly normal lids, irises & conjunctiva . ENT: grossly tongue is normal . Neck: no obvious mass . Cardiovascular: S1 S2 normal no gallop . Respiratory: Scattered coarse breath sounds and rhonchi are noted bilaterally . Abdomen: soft . Skin: no rash seen on limited exam . Musculoskeletal: not rigid . Psychiatric:unable to assess . Neurologic: no seizure no involuntary movements         Lab Data:   Basic Metabolic Panel: Recent Labs  Lab 02/12/19 0529 02/14/19 1035  NA 133* 135  K 3.9 3.9  CL 97* 100  CO2 24 27  GLUCOSE 96 158*  BUN 16 22*  CREATININE <0.30* 0.36*  CALCIUM 9.5 9.5  MG 1.9 2.0    ABG: No results for input(s): PHART, PCO2ART, PO2ART, HCO3, O2SAT in the last 168 hours.  Liver Function Tests: No results for input(s): AST, ALT, ALKPHOS, BILITOT, PROT, ALBUMIN in the last 168 hours. No results for input(s): LIPASE, AMYLASE in the last 168 hours. No results for input(s): AMMONIA in the  last 168 hours.  CBC: Recent Labs  Lab 02/12/19 0529 02/14/19 1035  WBC 9.3 21.7*  HGB 9.6* 8.8*  HCT 30.4* 29.0*  MCV 91.0 94.5  PLT 363 385    Cardiac Enzymes: No results for input(s): CKTOTAL, CKMB, CKMBINDEX, TROPONINI in the last 168 hours.  BNP (last 3 results) No results for input(s): BNP in the last 8760 hours.  ProBNP (last 3 results) No results for input(s): PROBNP in the last 8760 hours.  Radiological Exams: CT CHEST W CONTRAST  Result Date: 02/15/2019 CLINICAL DATA:  Pneumonia, fever and sacral decubitus ulcer. EXAM: CT CHEST, ABDOMEN, AND PELVIS WITH CONTRAST TECHNIQUE: Multidetector CT imaging of the chest, abdomen and pelvis was performed following the standard protocol during bolus administration of intravenous contrast. CONTRAST:  OMNIPAQUE IOHEXOL 300 MG/ML  SOLN COMPARISON:  CT of the pelvis on 01/29/2019 FINDINGS: CT CHEST FINDINGS Cardiovascular: The heart size is normal. No pericardial fluid identified. The thoracic aorta is normal in caliber. Central pulmonary arteries are normal in caliber. Mediastinum/Nodes: No enlarged mediastinal, hilar, or axillary lymph nodes. Thyroid gland, trachea, and esophagus demonstrate no significant findings. Lungs/Pleura: Tracheostomy present. Dense consolidation of the left lower lobe likely representing pneumonia with associated trace left pleural fluid. The left lower lobe bronchus appears narrowed and may contain debris/mucous. Underlying endobronchial lesion cannot be excluded by current CT. Patchy airspace disease also present in the left upper lobe. Mild ground-glass airspace disease in the right upper lobe  and atelectasis in the right lower lobe. No pneumothorax. Musculoskeletal: No chest wall mass or suspicious bone lesions identified. CT ABDOMEN PELVIS FINDINGS Hepatobiliary: The liver appears unremarkable. The gallbladder is contracted. No biliary ductal dilatation. Pancreas: Unremarkable. No pancreatic ductal dilatation  or surrounding inflammatory changes. Spleen: Normal in size without focal abnormality. Adrenals/Urinary Tract: Adrenal glands are unremarkable. Kidneys are normal, without renal calculi, focal lesion, or hydronephrosis. Bladder is decompressed by a Foley catheter. Stomach/Bowel: Rectal tube present. Bowel shows no evidence of obstruction, ileus or inflammation. Gastrostomy tube present entering the mid body of the stomach and in appropriate position. No free intraperitoneal air. Vascular/Lymphatic: No significant vascular findings are present. No enlarged abdominal or pelvic lymph nodes. Reproductive: Prostate is unremarkable. Other: Progression of sacral decubitus ulcer which is at least 2 cm in depth. Soft tissue defect nearly contacts the lower coccyx. No evidence of focal abscess or additional soft tissue gas. Musculoskeletal: No bony destruction or fractures identified. IMPRESSION: 1. Dense consolidation of the left lower lobe likely representing pneumonia with associated trace left pleural fluid. The left lower lobe bronchus appears narrowed and may contain debris/mucous. Underlying endobronchial lesion cannot be excluded by current CT. 2. Milder airspace disease in both upper lobes. 3. Progression of sacral decubitus ulcer without evidence of focal abscess or additional soft tissue gas. Electronically Signed   By: Aletta Edouard M.D.   On: 02/15/2019 12:53   CT ABDOMEN PELVIS W CONTRAST  Result Date: 02/15/2019 CLINICAL DATA:  Pneumonia, fever and sacral decubitus ulcer. EXAM: CT CHEST, ABDOMEN, AND PELVIS WITH CONTRAST TECHNIQUE: Multidetector CT imaging of the chest, abdomen and pelvis was performed following the standard protocol during bolus administration of intravenous contrast. CONTRAST:  171mL OMNIPAQUE IOHEXOL 300 MG/ML  SOLN COMPARISON:  CT of the pelvis on 01/29/2019 FINDINGS: CT CHEST FINDINGS Cardiovascular: The heart size is normal. No pericardial fluid identified. The thoracic aorta is  normal in caliber. Central pulmonary arteries are normal in caliber. Mediastinum/Nodes: No enlarged mediastinal, hilar, or axillary lymph nodes. Thyroid gland, trachea, and esophagus demonstrate no significant findings. Lungs/Pleura: Tracheostomy present. Dense consolidation of the left lower lobe likely representing pneumonia with associated trace left pleural fluid. The left lower lobe bronchus appears narrowed and may contain debris/mucous. Underlying endobronchial lesion cannot be excluded by current CT. Patchy airspace disease also present in the left upper lobe. Mild ground-glass airspace disease in the right upper lobe and atelectasis in the right lower lobe. No pneumothorax. Musculoskeletal: No chest wall mass or suspicious bone lesions identified. CT ABDOMEN PELVIS FINDINGS Hepatobiliary: The liver appears unremarkable. The gallbladder is contracted. No biliary ductal dilatation. Pancreas: Unremarkable. No pancreatic ductal dilatation or surrounding inflammatory changes. Spleen: Normal in size without focal abnormality. Adrenals/Urinary Tract: Adrenal glands are unremarkable. Kidneys are normal, without renal calculi, focal lesion, or hydronephrosis. Bladder is decompressed by a Foley catheter. Stomach/Bowel: Rectal tube present. Bowel shows no evidence of obstruction, ileus or inflammation. Gastrostomy tube present entering the mid body of the stomach and in appropriate position. No free intraperitoneal air. Vascular/Lymphatic: No significant vascular findings are present. No enlarged abdominal or pelvic lymph nodes. Reproductive: Prostate is unremarkable. Other: Progression of sacral decubitus ulcer which is at least 2 cm in depth. Soft tissue defect nearly contacts the lower coccyx. No evidence of focal abscess or additional soft tissue gas. Musculoskeletal: No bony destruction or fractures identified. IMPRESSION: 1. Dense consolidation of the left lower lobe likely representing pneumonia with associated  trace left pleural fluid. The left lower lobe  bronchus appears narrowed and may contain debris/mucous. Underlying endobronchial lesion cannot be excluded by current CT. 2. Milder airspace disease in both upper lobes. 3. Progression of sacral decubitus ulcer without evidence of focal abscess or additional soft tissue gas. Electronically Signed   By: Irish Lack M.D.   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 Acute on chronic respiratory failure with hypoxia patient continues on T collar no plan on decannulation right now we are hopeful that he will stay off the ventilator.  In addition patient had a CT scan and there is some concern about the lower lobe bronchus being narrowed which could contain some mucous plug and he may need to have an airway evaluation done. 2. Lobar pneumonia reviewed the CT scan of the abdomen and it does indeed show similar narrowing will tentatively try to schedule him for bronchoscopy tomorrow to do an airway evaluation and also sent for cultures. 3. Chronic congestive heart failure patient right now is at baseline we will continue with managing fluids 4. C7 fracture status post spinal cord injury and is status post ACDF 5. Pulmonary embolism treated we will continue with supportive care   I have personally seen and evaluated the patient, evaluated laboratory and imaging results, formulated the assessment and plan and placed orders. The Patient requires high complexity decision making with multiple systems involvement.  Rounds were done with the Respiratory Therapy Director and Staff therapists and discussed with nursing staff also.  Time 35 minutes  Yevonne Pax, MD [facility] Pulmonary Critical Care  Medicine Sleep Medicine

## 2019-02-17 DIAGNOSIS — I509 Heart failure, unspecified: Secondary | ICD-10-CM | POA: Diagnosis not present

## 2019-02-17 DIAGNOSIS — J398 Other specified diseases of upper respiratory tract: Secondary | ICD-10-CM

## 2019-02-17 DIAGNOSIS — J181 Lobar pneumonia, unspecified organism: Secondary | ICD-10-CM | POA: Diagnosis not present

## 2019-02-17 DIAGNOSIS — I2699 Other pulmonary embolism without acute cor pulmonale: Secondary | ICD-10-CM | POA: Diagnosis not present

## 2019-02-17 DIAGNOSIS — J9621 Acute and chronic respiratory failure with hypoxia: Secondary | ICD-10-CM | POA: Diagnosis not present

## 2019-02-17 DIAGNOSIS — R590 Localized enlarged lymph nodes: Secondary | ICD-10-CM

## 2019-02-17 DIAGNOSIS — J9811 Atelectasis: Secondary | ICD-10-CM | POA: Diagnosis not present

## 2019-02-17 LAB — BASIC METABOLIC PANEL
Anion gap: 4 — ABNORMAL LOW (ref 5–15)
BUN: 17 mg/dL (ref 6–20)
CO2: 34 mmol/L — ABNORMAL HIGH (ref 22–32)
Calcium: 9.1 mg/dL (ref 8.9–10.3)
Chloride: 103 mmol/L (ref 98–111)
Creatinine, Ser: 0.3 mg/dL — ABNORMAL LOW (ref 0.61–1.24)
GFR calc Af Amer: >60 mL/min (ref >60)
GFR calc non Af Amer: >60 mL/min (ref >60)
Glucose, Bld: 118 mg/dL — ABNORMAL HIGH (ref 70–99)
Potassium: 3.3 mmol/L — ABNORMAL LOW (ref 3.5–5.1)
Sodium: 141 mmol/L (ref 135–145)

## 2019-02-17 LAB — CBC
HCT: 27.1 % — ABNORMAL LOW (ref 39.0–52.0)
Hemoglobin: 8.2 g/dL — ABNORMAL LOW (ref 13.0–17.0)
MCH: 27.8 pg (ref 26.0–34.0)
MCHC: 30.3 g/dL (ref 30.0–36.0)
MCV: 91.9 fL (ref 80.0–100.0)
Platelets: 354 10*3/uL (ref 150–400)
RBC: 2.95 MIL/uL — ABNORMAL LOW (ref 4.22–5.81)
RDW: 15.9 % — ABNORMAL HIGH (ref 11.5–15.5)
WBC: 7 10*3/uL (ref 4.0–10.5)
nRBC: 0 % (ref 0.0–0.2)

## 2019-02-17 NOTE — Progress Notes (Signed)
Pulmonary Critical Care Medicine [facility] [place]   PULMONARY CRITICAL CARE SERVICE  PROGRESS NOTE  Date of Service: 02/17/2019  Raymond Avila  YYQ:[id number]  DOB: 1965/06/21   DOA: 12/23/2018  Referring Physician: [name], MD  HPI: Raymond Avila is a 54 y.o. male seen for follow up of Acute on Chronic Respiratory Failure.  This morning patient was on T collar appeared to be comfortable.  CT scan as previously noted showed that the left lower lobe might have some mucous plugging it so we are going to do an airway evaluation.  Overall the fevers are coming down also this morning the patient had a temperature of 98.67F and appeared to be more comfortable  Medications: Reviewed on Rounds  Physical Exam:  Vitals: Temperature 98.5 pulse 88 respiratory rate 29 blood pressure is 149/74 saturations 99%  Ventilator Settings off the ventilator on T collar with an FiO2 of 28%  . General: Comfortable at this time . Eyes: Grossly normal lids, irises & conjunctiva . ENT: grossly tongue is normal . Neck: no obvious mass . Cardiovascular: S1 S2 normal no gallop . Respiratory: Coarse breath sounds no rhonchi . Abdomen: soft . Skin: no rash seen on limited exam . Musculoskeletal: not rigid . Psychiatric:unable to assess . Neurologic: no seizure no involuntary movements         Lab Data:   Basic Metabolic Panel: Recent Labs  Lab 02/12/19 0529 02/14/19 1035  NA 133* 135  K 3.9 3.9  CL 97* 100  CO2 24 27  GLUCOSE 96 158*  BUN 16 22*  CREATININE <0.30* 0.36*  CALCIUM 9.5 9.5  MG 1.9 2.0    ABG: No results for input(s): PHART, PCO2ART, PO2ART, HCO3, O2SAT in the last 168 hours.  Liver Function Tests: No results for input(s): AST, ALT, ALKPHOS, BILITOT, PROT, ALBUMIN in the last 168 hours. No results for input(s): LIPASE, AMYLASE in the last 168 hours. No results for input(s): AMMONIA in the last 168 hours.  CBC: Recent Labs  Lab 02/12/19 0529  02/14/19 1035  WBC 9.3 21.7*  HGB 9.6* 8.8*  HCT 30.4* 29.0*  MCV 91.0 94.5  PLT 363 385    Cardiac Enzymes: No results for input(s): CKTOTAL, CKMB, CKMBINDEX, TROPONINI in the last 168 hours.  BNP (last 3 results) No results for input(s): BNP in the last 8760 hours.  ProBNP (last 3 results) No results for input(s): PROBNP in the last 8760 hours.  Radiological Exams: CT CHEST W CONTRAST  Result Date: 02/15/2019 CLINICAL DATA:  Pneumonia, fever and sacral decubitus ulcer. EXAM: CT CHEST, ABDOMEN, AND PELVIS WITH CONTRAST TECHNIQUE: Multidetector CT imaging of the chest, abdomen and pelvis was performed following the standard protocol during bolus administration of intravenous contrast. CONTRAST:  OMNIPAQUE IOHEXOL 300 MG/ML  SOLN COMPARISON:  CT of the pelvis on 01/29/2019 FINDINGS: CT CHEST FINDINGS Cardiovascular: The heart size is normal. No pericardial fluid identified. The thoracic aorta is normal in caliber. Central pulmonary arteries are normal in caliber. Mediastinum/Nodes: No enlarged mediastinal, hilar, or axillary lymph nodes. Thyroid gland, trachea, and esophagus demonstrate no significant findings. Lungs/Pleura: Tracheostomy present. Dense consolidation of the left lower lobe likely representing pneumonia with associated trace left pleural fluid. The left lower lobe bronchus appears narrowed and may contain debris/mucous. Underlying endobronchial lesion cannot be excluded by current CT. Patchy airspace disease also present in the left upper lobe. Mild ground-glass airspace disease in the right upper lobe and atelectasis in the right lower lobe.  No pneumothorax. Musculoskeletal: No chest wall mass or suspicious bone lesions identified. CT ABDOMEN PELVIS FINDINGS Hepatobiliary: The liver appears unremarkable. The gallbladder is contracted. No biliary ductal dilatation. Pancreas: Unremarkable. No pancreatic ductal dilatation or surrounding inflammatory changes. Spleen: Normal in  size without focal abnormality. Adrenals/Urinary Tract: Adrenal glands are unremarkable. Kidneys are normal, without renal calculi, focal lesion, or hydronephrosis. Bladder is decompressed by a Foley catheter. Stomach/Bowel: Rectal tube present. Bowel shows no evidence of obstruction, ileus or inflammation. Gastrostomy tube present entering the mid body of the stomach and in appropriate position. No free intraperitoneal air. Vascular/Lymphatic: No significant vascular findings are present. No enlarged abdominal or pelvic lymph nodes. Reproductive: Prostate is unremarkable. Other: Progression of sacral decubitus ulcer which is at least 2 cm in depth. Soft tissue defect nearly contacts the lower coccyx. No evidence of focal abscess or additional soft tissue gas. Musculoskeletal: No bony destruction or fractures identified. IMPRESSION: 1. Dense consolidation of the left lower lobe likely representing pneumonia with associated trace left pleural fluid. The left lower lobe bronchus appears narrowed and may contain debris/mucous. Underlying endobronchial lesion cannot be excluded by current CT. 2. Milder airspace disease in both upper lobes. 3. Progression of sacral decubitus ulcer without evidence of focal abscess or additional soft tissue gas. Electronically Signed   By: Aletta Edouard M.D.   On: 02/15/2019 12:53   CT ABDOMEN PELVIS W CONTRAST  Result Date: 02/15/2019 CLINICAL DATA:  Pneumonia, fever and sacral decubitus ulcer. EXAM: CT CHEST, ABDOMEN, AND PELVIS WITH CONTRAST TECHNIQUE: Multidetector CT imaging of the chest, abdomen and pelvis was performed following the standard protocol during bolus administration of intravenous contrast. CONTRAST:  173mL OMNIPAQUE IOHEXOL 300 MG/ML  SOLN COMPARISON:  CT of the pelvis on 01/29/2019 FINDINGS: CT CHEST FINDINGS Cardiovascular: The heart size is normal. No pericardial fluid identified. The thoracic aorta is normal in caliber. Central pulmonary arteries are normal in  caliber. Mediastinum/Nodes: No enlarged mediastinal, hilar, or axillary lymph nodes. Thyroid gland, trachea, and esophagus demonstrate no significant findings. Lungs/Pleura: Tracheostomy present. Dense consolidation of the left lower lobe likely representing pneumonia with associated trace left pleural fluid. The left lower lobe bronchus appears narrowed and may contain debris/mucous. Underlying endobronchial lesion cannot be excluded by current CT. Patchy airspace disease also present in the left upper lobe. Mild ground-glass airspace disease in the right upper lobe and atelectasis in the right lower lobe. No pneumothorax. Musculoskeletal: No chest wall mass or suspicious bone lesions identified. CT ABDOMEN PELVIS FINDINGS Hepatobiliary: The liver appears unremarkable. The gallbladder is contracted. No biliary ductal dilatation. Pancreas: Unremarkable. No pancreatic ductal dilatation or surrounding inflammatory changes. Spleen: Normal in size without focal abnormality. Adrenals/Urinary Tract: Adrenal glands are unremarkable. Kidneys are normal, without renal calculi, focal lesion, or hydronephrosis. Bladder is decompressed by a Foley catheter. Stomach/Bowel: Rectal tube present. Bowel shows no evidence of obstruction, ileus or inflammation. Gastrostomy tube present entering the mid body of the stomach and in appropriate position. No free intraperitoneal air. Vascular/Lymphatic: No significant vascular findings are present. No enlarged abdominal or pelvic lymph nodes. Reproductive: Prostate is unremarkable. Other: Progression of sacral decubitus ulcer which is at least 2 cm in depth. Soft tissue defect nearly contacts the lower coccyx. No evidence of focal abscess or additional soft tissue gas. Musculoskeletal: No bony destruction or fractures identified. IMPRESSION: 1. Dense consolidation of the left lower lobe likely representing pneumonia with associated trace left pleural fluid. The left lower lobe bronchus  appears narrowed and may contain debris/mucous.  Underlying endobronchial lesion cannot be excluded by current CT. 2. Milder airspace disease in both upper lobes. 3. Progression of sacral decubitus ulcer without evidence of focal abscess or additional soft tissue gas. Electronically Signed   By: Irish Lack M.D.   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 Acute on chronic respiratory failure with hypoxia right now on T collar on an FiO2 of 28% patient is going to be continued with T collar trials for now we will be doing an airway evaluation 2. Lobar pneumonia treated we will continue with supportive care 3. Chronic congestive heart failure at baseline we will continue with supportive care 4. C7 fracture status post ACDF 5. Pulmonary embolism patient has been on Eliquis 6. Lower lobe atelectasis probable mucous plug we will do bronchoscopy for airway evaluation today   I have personally seen and evaluated the patient, evaluated laboratory and imaging results, formulated the assessment and plan and placed orders. The Patient requires high complexity decision making with multiple systems involvement.  Rounds were done with the Respiratory Therapy Director and Staff therapists and discussed with nursing staff also.  Yevonne Pax, MD [facility] Pulmonary Critical Care Medicine Sleep Medicine

## 2019-02-17 NOTE — Procedures (Addendum)
Date: 02/17/2019,  MRN# [id number]    Procedure Note: Fiberoptic Bronchoscopy   PROCEDURE DATE: 02/17/2019     NAME:  Raymond Avila   DOB:03-09-1965   MRN: [id number] LOC:  5E21C/5E21C-01      Indications/Preliminary Diagnosis: Lower lobe atelectasis retained tracheal secretions  Consent: (Place X beside choice/s below)  The benefits, risks and possible complications of the procedure were        explained to:  ___ patient  _X__ patient's family  ___ other:___________  who verbalized understanding and gave:  ___ verbal  ___ written  ___ verbal and written  __X_ telephone  ___ other:________ consent.      Unable to obtain consent; procedure performed on emergent basis.     Other:      PRESEDATION ASSESSMENT: History and Physical has been performed. Patient meds and allergies have been reviewed. Presedation airway examination has been performed and documented. Baseline vital signs, sedation score, oxygenation status, and cardiac rhythm were reviewed. Patient was deemed to be in satisfactory condition to undergo the procedure.  PREMEDICATIONS:   Sedative/Narcotic Amt Dose   Versed 2 mg   Fentanyl 50 Mcg  Diprivan 0 mg         Insertion Route (Place X beside choice below)   Nasal   Oral   Endotracheal Tube  X Tracheostomy   INTRAPROCEDURE MEDICATIONS:  Sedative/Narcotic Amt Dose   Versed 0 mg   Fentanyl 0 mcg  Diprivan 0 mg       Medication Amt Dose  Medication Amt Dose  Xylocaine 2% 8 cc  Epinephrine 1:10,000 sol  cc  Xylocaine 4%  cc  Cocaine  cc   TECHNICAL PROCEDURES: (Place X beside choice below)   Procedures  Description  X  None     Electrocautery     Cryotherapy     Balloon Dilatation     Bronchography     Stent Placement     Therapeutic Aspiration     Laser/Argon Plasma            SPECIMENS (Sites): (Place X beside choice below)  Specimens Description   No Specimens Obtained     Washings   X Lavage  left lower lobe   Biopsies    Fine Needle  Aspirates    Brushings    Sputum      ESTIMATED BLOOD LOSS: none  COMPLICATIONS/RESOLUTION: none  PROCEDURE DETAILS: Timeout performed and correct patient, name, & ID confirmed. Following prep per Pulmonary policy, appropriate sedation was administered.  Airway exam proceeded with findings, technical procedures, and specimen collection as noted below. At the end of exam the scope was withdrawn without incident. Impression and Plan as noted below.    Procedure Note: After patient was prepared in the usual manner timeout performed and adequate to sedation which is achieved the fiberoptic scope was inserted down through the tube in the trachea.  On immediate inspection of the trachea was noted to have dry crusted secretions in the lower portion of the tracheostomy tube.  These secretions were easily removed with the bronchoscope and then the bronchoscope was advanced to the carina which was nice and sharp although erythematous.  Left lower lobe was evaluated and found to have significant retained secretions.  We were able to instill normal saline with a good lavage and removal of the secretions.  Underlying mucosa was somewhat erythematous however there was no sign of any active bleeding.  The right lung was also evaluated and found to be  free of any endobronchial disease.  The lavage that was performed was from the left lower lobe with instillation of approximately 100 cc of saline with good return the captured fluid was sent to the lab for cultures.    IMPRESSION:POST-PROCEDURE DX: Retained tracheal secretions partial atelectasis of left lower lobe   RECOMMENDATION/PLAN: Continue with aggressive pulmonary toilet and humidification of the lung via the tracheostomy for now.  Follow-up on cultures as they become available  I have personally performed the procedure as noted above     Allyne Gee, MD [facility] Pulmonary Critical Care Medicine

## 2019-02-18 DIAGNOSIS — J181 Lobar pneumonia, unspecified organism: Secondary | ICD-10-CM | POA: Diagnosis not present

## 2019-02-18 DIAGNOSIS — I509 Heart failure, unspecified: Secondary | ICD-10-CM | POA: Diagnosis not present

## 2019-02-18 DIAGNOSIS — S12690S Other displaced fracture of seventh cervical vertebra, sequela: Secondary | ICD-10-CM | POA: Diagnosis not present

## 2019-02-18 DIAGNOSIS — J9621 Acute and chronic respiratory failure with hypoxia: Secondary | ICD-10-CM | POA: Diagnosis not present

## 2019-02-18 LAB — CULTURE, RESPIRATORY W GRAM STAIN

## 2019-02-18 LAB — ACID FAST SMEAR (AFB, MYCOBACTERIA): Acid Fast Smear: NEGATIVE

## 2019-02-18 LAB — POTASSIUM: Potassium: 3.4 mmol/L — ABNORMAL LOW (ref 3.5–5.1)

## 2019-02-18 NOTE — Progress Notes (Addendum)
PROGRESS NOTE    Raymond Avila  XNT:[id number] DOB: 13-Oct-1965 DOA: 12/23/2018  Brief Narrative:  Raymond Beecham Shearinis an 54 y.o.malewith morbid obesity, schizoaffective disorder, Tourette syndrome, COPD, hypertension, obstructive sleep apnea who had a recent fall with neck trauma which caused C7/T1jumped facets in his neck with anterior listhesis and severe spinal canalnarrowing with spinal cord injury and paraplegia. He was admitted to outside hospital on 11/23/2018. He required anterior cervical disc fusion and despite this developed paraplegia. Postoperatively patient was intubated and had respiratory complications after that. He developed pneumonia secondary to Klebsiella. He had prolonged ventilation and eventually underwent tracheostomy placement. Patient also had DVT in right lower extremity and left upper extremity which led to pulmonary embolism. He was initially on heparin drip and then transitioned to Eliquis. He also had a feeding tube placed. Apparently posterior surgical fusion was postponed secondary to the patient's medical condition and inability to stay prone for the surgery because of the respiratory failure. Due to his medical problems he was transferred to [facility]. After admission here patient has been having fever spikes. All cultures to date negative. Hewas treated withIV vancomycin, meropenem,fluconazole.However, continued to have fevers despite being on antibiotics. Therefore antibiotics have been discontinued. Thought to be neuroleptic malignant syndrome? Due to psych medications. His psych medications were adjusted.  After that patient started having diarrhea.  He has stool for C. difficile checked in the past and it was negative.  He was treated empirically with p.o. vancomycin. 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  CT of the abdomen and pelvis was done on 02/15/2019 which showed dense consolidation of the left  lower lobe likely pneumonia.  Left lower lobe bronchus narrowed, debris/mucus.  Progression of the sacral decubitus ulcer without evidence of focal abscess or additional soft tissue gas.  02/18/2019: He had worsening leukocytosis and started on fluconazole, Zosyn.  Denies having any major complaints at this time.  On T collar, 28% FiO2.  Continues to have the c-collar.   Assessment & Plan:   Active Problems:   Acute on chronic respiratory failure with hypoxia (HCC)   Lobar pneumonia, unspecified organism (HCC)   Chronic congestive heart failure (HCC)   Closed C7 fracture without spinal cord injury, sequela   Pulmonary embolism and infarction (HCC) Sacrococcygeal pressure ulcer unstageable Leukocytosis Diarrhea Right lower extremity/left upper extremity DVT Fevers COPD Obstructive sleep apnea Morbid obesity Protein calorie malnutrition Dysphagia History of schizoaffective disorder   Acute on chronic hypoxemic respiratory failure: Improving.  Previouschest x-ray showed mild right basilar atelectasis or infiltrate. Healready received treatment withIV vancomycin, meropenem, fluconazole. PreviousTracheal aspirate cultures showing rare gram-positive rods, normal respiratory flora. Pulmonary following.  Restarted on fluconazole, Zosyn secondary to leukocytosis, infected sacral pressure ulcer.  Stable at this time.  However, he is at high risk for recurrent pneumonia.  Pneumonia:Already treated with IV vancomycin, ertapenem, fluconazole. Pulmonary following.  He is stable at this time.  However, high risk for recurrent pneumonia, worsening respiratory failure. If his respiratory statusworsens,would recommend CT imaging for better evaluation.   Fever: Patientwashaving persistent fever since his admission here despite being on antibiotics. Respiratory cultures showing normal flora.There was also concern for aspiration. Fevers were thought to be either secondary to DVT/PE versus  neurolept malignant syndrome. His psychiatric medications were adjusted.  He was also treated with antimicrobials and then with p.o. vancomycin for diarrhea.    Afebrile at this time.  Currently on fluconazole, Zosyn.  However, high risk for recurrent fevers.  Continue to monitor.  Sacrococcygeal pressure ulcer, unstageable: Continue local wound care.  Currently restarted on fluconazole, Zosyn.  May need surgical evaluation.   Leukocytosis: Patient had elevated WBC count 21.7.  CT imaging findings as mentioned above.  Concern for infected sacrococcygeal pressure ulcer.  Already treated with antibiotics in the past.  Restarted on Zosyn, fluconazole with a tentative duration of 2 weeks pending improvement.  Continue to monitor counts.  Diarrhea: In the past he stool for C. difficile was negative.  The diarrhea could be secondary to tube feeds?  He was already treated with p.o. vancomycin.    Recommended trial of cholestyramine.  Diarrhea improving.  Right lower extremity DVT/left upper extremity DVT/pulmonary embolism: He was on IV heparin at the outside facility now transitioned to Eliquis. Further management per the primary team.  C7 fracture status post ACDF surgery: Surgical site stable. Unfortunately has paraplegia with weakness. Continue supportive management per the primary team.  COPD: Continue medication and management per primary team and pulmonary.  Morbid obesity/obstructive sleep apnea: Patient admits to not using the CPAP. Continue management per primary team and pulmonary.  Protein calorie malnutrition: Continue management per primary team.  Dysphagia: Unfortunately due to his dysphagia he is high risk for aspiration and worsening respiratory failure secondary to aspiration pneumonia.  Schizoaffective disorder: Medications and management per the primary team.    Subjective: Awake and oriented x3, denies having any major complaints at this  time.  Objective: Vitals: Temperature 98.9, pulse 95, respiratory 16, blood pressure 155/78, oxygen saturation 98% on 28% FiO2.  Examination: General exam:Morbidly obese male, awake, not in any acute distress HEENT:Atraumatic, normocephalic, pupils equal and reactive, ear or nose lesions. Neck:Trach, has c-collar in place Respiratory system:Occasional rhonchi, no wheezing anteriorly Cardiovascular system:S1 &S2 heard, no murmur. Gastrointestinal system:Obese male,soft and nontender. Normal bowel sounds heard. Central nervous system:Weakness in all extremities Extremities:Edema lower extremity Skin: No rashes, sacrococcygeal pressure ulcer unstageable Psychiatry:Mood &affect appropriate.      Data Reviewed: I have personally reviewed following labs and imaging studies  CBC: Recent Labs  Lab 02/12/19 0529 02/14/19 1035 02/17/19 1015  WBC 9.3 21.7* 7.0  HGB 9.6* 8.8* 8.2*  HCT 30.4* 29.0* 27.1*  MCV 91.0 94.5 91.9  PLT 363 385 213   Basic Metabolic Panel: Recent Labs  Lab 02/12/19 0529 02/14/19 1035 02/17/19 1015 02/18/19 0611  NA 133* 135 141  --   K 3.9 3.9 3.3* 3.4*  CL 97* 100 103  --   CO2 24 27 34*  --   GLUCOSE 96 158* 118*  --   BUN 16 22* 17  --   CREATININE <0.30* 0.36* 0.30*  --   CALCIUM 9.5 9.5 9.1  --   MG 1.9 2.0  --   --    GFR: CrCl cannot be calculated (Unknown ideal weight.). Liver Function Tests: No results for input(s): AST, ALT, ALKPHOS, BILITOT, PROT, ALBUMIN in the last 168 hours. No results for input(s): LIPASE, AMYLASE in the last 168 hours. No results for input(s): AMMONIA in the last 168 hours. Coagulation Profile: No results for input(s): INR, PROTIME in the last 168 hours. Cardiac Enzymes: No results for input(s): CKTOTAL, CKMB, CKMBINDEX, TROPONINI in the last 168 hours. BNP (last 3 results) No results for input(s): PROBNP in the last 8760 hours. HbA1C: No results for input(s): HGBA1C in the last 72  hours. CBG: No results for input(s): GLUCAP in the last 168 hours. Lipid Profile: No results for input(s): CHOL, HDL, LDLCALC, TRIG,  CHOLHDL, LDLDIRECT in the last 72 hours. Thyroid Function Tests: No results for input(s): TSH, T4TOTAL, FREET4, T3FREE, THYROIDAB in the last 72 hours. Anemia Panel: No results for input(s): VITAMINB12, FOLATE, FERRITIN, TIBC, IRON, RETICCTPCT in the last 72 hours. Sepsis Labs: No results for input(s): PROCALCITON, LATICACIDVEN in the last 168 hours.  Recent Results (from the past 240 hour(s))  Culture, Urine     Status: None   Collection Time: 02/10/19  4:00 AM   Specimen: Urine, Catheterized  Result Value Ref Range Status   Specimen Description URINE, CATHETERIZED  Final   Special Requests Normal  Final   Culture   Final    NO GROWTH Performed at [facility], [address] [place], [place] [address]    Report Status 02/10/2019 FINAL  Final  Culture, respiratory (non-expectorated)     Status: None   Collection Time: 02/14/19  7:08 AM   Specimen: Tracheal Aspirate; Respiratory  Result Value Ref Range Status   Specimen Description TRACHEAL ASPIRATE  Final   Special Requests NONE  Final   Gram Stain   Final    ABUNDANT WBC PRESENT, PREDOMINANTLY PMN MODERATE GRAM POSITIVE COCCI IN CLUSTERS RARE YEAST RARE GRAM POSITIVE RODS Performed at [facility], [place] [address], [place], [place] [address]    Culture MODERATE STAPHYLOCOCCUS AUREUS  Final   Report Status 02/18/2019 FINAL  Final   Organism ID, Bacteria STAPHYLOCOCCUS AUREUS  Final      Susceptibility   Staphylococcus aureus - MIC*    CIPROFLOXACIN <=0.5 SENSITIVE Sensitive     ERYTHROMYCIN RESISTANT Resistant     GENTAMICIN <=0.5 SENSITIVE Sensitive     OXACILLIN 0.5 SENSITIVE Sensitive     TETRACYCLINE <=1 SENSITIVE Sensitive     VANCOMYCIN <=0.5 SENSITIVE Sensitive     TRIMETH/SULFA <=10 SENSITIVE Sensitive     CLINDAMYCIN RESISTANT Resistant     RIFAMPIN <=0.5 SENSITIVE  Sensitive     Inducible Clindamycin POSITIVE Resistant     * MODERATE STAPHYLOCOCCUS AUREUS  Culture, blood (routine x 2)     Status: None (Preliminary result)   Collection Time: 02/14/19 11:03 AM   Specimen: BLOOD RIGHT ARM  Result Value Ref Range Status   Specimen Description BLOOD RIGHT ARM  Final   Special Requests   Final    BOTTLES DRAWN AEROBIC ONLY Blood Culture adequate volume   Culture   Final    NO GROWTH 4 DAYS Performed at [facility], [address] [place], [place] [address]    Report Status PENDING  Incomplete  Culture, blood (routine x 2)     Status: None (Preliminary result)   Collection Time: 02/14/19 11:04 AM   Specimen: BLOOD RIGHT ARM  Result Value Ref Range Status   Specimen Description BLOOD RIGHT ARM  Final   Special Requests   Final    BOTTLES DRAWN AEROBIC ONLY Blood Culture adequate volume   Culture   Final    NO GROWTH 4 DAYS Performed at [facility], [place] [address] [place], [place] [address]    Report Status PENDING  Incomplete  Culture, Urine     Status: None   Collection Time: 02/14/19  5:00 PM   Specimen: Urine, Random  Result Value Ref Range Status   Specimen Description URINE, RANDOM  Final   Special Requests SITE NOT SPECIFIED  Final   Culture   Final    NO GROWTH Performed at [facility], [address], [place], [place] [address]    Report Status 02/15/2019 FINAL  Final  Acid Fast Smear (AFB)     Status: None   Collection Time: 02/17/19  9:54 AM   Specimen: Bronchoalveolar Lavage; Respiratory  Result Value Ref Range Status   AFB Specimen Processing Concentration  Final   Acid Fast Smear Negative  Final    Comment: (NOTE) Performed At: [facility] [place], [place] [id number] Rush Farmer MD DB:[id number]    Source (AFB) BRONCHIAL ALVEOLAR LAVAGE  Final    Comment: Performed at [facility], [place] [address], [place], [place] [address]  Culture, respiratory (non-expectorated)      Status: None (Preliminary result)   Collection Time: 02/17/19 10:00 AM   Specimen: Bronchoalveolar Lavage; Respiratory  Result Value Ref Range Status   Specimen Description BRONCHIAL ALVEOLAR LAVAGE  Final   Special Requests NONE  Final   Gram Stain   Final    FEW WBC PRESENT, PREDOMINANTLY PMN NO ORGANISMS SEEN    Culture   Final    RARE STAPHYLOCOCCUS AUREUS SUSCEPTIBILITIES TO FOLLOW Performed at [facility], [address], [place], [place] [address]    Report Status PENDING  Incomplete         Radiology Studies: No results found.      Scheduled Meds: Please see MAR  Yaakov Guthrie, MD 02/18/2019, 3:01 PM

## 2019-02-18 NOTE — Progress Notes (Signed)
Pulmonary Critical Care Medicine [facility] [place]   PULMONARY CRITICAL CARE SERVICE  PROGRESS NOTE  Date of Service: 02/18/2019  Raymond Avila  SAY:[id number]  DOB: 1965-07-15   DOA: 12/23/2018  Referring Physician: [name], MD  HPI: Raymond Avila is a 54 y.o. male seen for follow up of Acute on Chronic Respiratory Failure.  Patient is on T collar currently on 28% FiO2 good saturations are noted at this time he is tolerating speaking also  Medications: Reviewed on Rounds  Physical Exam:  Vitals: Temperature 98.9 pulse 95 respiratory 16 blood pressure is 155/79 saturations 98%  Ventilator Settings on T collar currently on 28% FiO2  . General: Comfortable at this time . Eyes: Grossly normal lids, irises & conjunctiva . ENT: grossly tongue is normal . Neck: no obvious mass . Cardiovascular: S1 S2 normal no gallop . Respiratory: No rhonchi no rales are noted at this time . Abdomen: soft . Skin: no rash seen on limited exam . Musculoskeletal: not rigid . Psychiatric:unable to assess . Neurologic: no seizure no involuntary movements         Lab Data:   Basic Metabolic Panel: Recent Labs  Lab 02/12/19 0529 02/14/19 1035 02/17/19 1015 02/18/19 0611  NA 133* 135 141  --   K 3.9 3.9 3.3* 3.4*  CL 97* 100 103  --   CO2 24 27 34*  --   GLUCOSE 96 158* 118*  --   BUN 16 22* 17  --   CREATININE <0.30* 0.36* 0.30*  --   CALCIUM 9.5 9.5 9.1  --   MG 1.9 2.0  --   --     ABG: No results for input(s): PHART, PCO2ART, PO2ART, HCO3, O2SAT in the last 168 hours.  Liver Function Tests: No results for input(s): AST, ALT, ALKPHOS, BILITOT, PROT, ALBUMIN in the last 168 hours. No results for input(s): LIPASE, AMYLASE in the last 168 hours. No results for input(s): AMMONIA in the last 168 hours.  CBC: Recent Labs  Lab 02/12/19 0529 02/14/19 1035 02/17/19 1015  WBC 9.3 21.7* 7.0  HGB 9.6* 8.8* 8.2*  HCT 30.4* 29.0* 27.1*  MCV 91.0 94.5 91.9  PLT 363  385 354    Cardiac Enzymes: No results for input(s): CKTOTAL, CKMB, CKMBINDEX, TROPONINI in the last 168 hours.  BNP (last 3 results) No results for input(s): BNP in the last 8760 hours.  ProBNP (last 3 results) No results for input(s): PROBNP in the last 8760 hours.  Radiological Exams: No results found.  Assessment/Plan Active Problems:   Acute on chronic respiratory failure with hypoxia (HCC)   Lobar pneumonia, unspecified organism (HCC)   Chronic congestive heart failure (HCC)   Closed C7 fracture without spinal cord injury, sequela   Pulmonary embolism and infarction (HCC)   1. Acute on chronic respiratory failure with hypoxia patient did fine with the bronchoscopy yesterday he had secretions noted which were removed now is doing better good oxygenation is noted FiO2 is down to 28% secretions are still mild patient is able to cough however 2. Lobar pneumonia treated we'll continue to follow patient this morning was afebrile 3. Chronic congestive heart failure at baseline appears to be compensated 4. C7 fracture status post ACDF 5. Pulmonary embolism treated we'll continue with supportive care   I have personally seen and evaluated the patient, evaluated laboratory and imaging results, formulated the assessment and plan and placed orders. The Patient requires high complexity decision making with multiple systems involvement.  Rounds were done with the Respiratory Therapy Director and Staff therapists and discussed with nursing staff also.  Allyne Gee, MD [facility] Pulmonary Critical Care Medicine Sleep Medicine

## 2019-02-19 DIAGNOSIS — S12690S Other displaced fracture of seventh cervical vertebra, sequela: Secondary | ICD-10-CM | POA: Diagnosis not present

## 2019-02-19 DIAGNOSIS — I509 Heart failure, unspecified: Secondary | ICD-10-CM | POA: Diagnosis not present

## 2019-02-19 DIAGNOSIS — J181 Lobar pneumonia, unspecified organism: Secondary | ICD-10-CM | POA: Diagnosis not present

## 2019-02-19 DIAGNOSIS — J9621 Acute and chronic respiratory failure with hypoxia: Secondary | ICD-10-CM | POA: Diagnosis not present

## 2019-02-19 LAB — CBC
HCT: 27.4 % — ABNORMAL LOW (ref 39.0–52.0)
Hemoglobin: 8.3 g/dL — ABNORMAL LOW (ref 13.0–17.0)
MCH: 27.9 pg (ref 26.0–34.0)
MCHC: 30.3 g/dL (ref 30.0–36.0)
MCV: 92.3 fL (ref 80.0–100.0)
Platelets: 388 10*3/uL (ref 150–400)
RBC: 2.97 MIL/uL — ABNORMAL LOW (ref 4.22–5.81)
RDW: 15.8 % — ABNORMAL HIGH (ref 11.5–15.5)
WBC: 8.2 10*3/uL (ref 4.0–10.5)
nRBC: 0 % (ref 0.0–0.2)

## 2019-02-19 LAB — CULTURE, BLOOD (ROUTINE X 2)
Culture: NO GROWTH
Culture: NO GROWTH
Special Requests: ADEQUATE
Special Requests: ADEQUATE

## 2019-02-19 LAB — MISC LABCORP TEST (SEND OUT): Labcorp test code: 186296

## 2019-02-19 LAB — BASIC METABOLIC PANEL
Anion gap: 11 (ref 5–15)
BUN: 14 mg/dL (ref 6–20)
CO2: 30 mmol/L (ref 22–32)
Calcium: 9.2 mg/dL (ref 8.9–10.3)
Chloride: 102 mmol/L (ref 98–111)
Creatinine, Ser: 0.46 mg/dL — ABNORMAL LOW (ref 0.61–1.24)
GFR calc Af Amer: >60 mL/min (ref >60)
GFR calc non Af Amer: >60 mL/min (ref >60)
Glucose, Bld: 164 mg/dL — ABNORMAL HIGH (ref 70–99)
Potassium: 3.4 mmol/L — ABNORMAL LOW (ref 3.5–5.1)
Sodium: 143 mmol/L (ref 135–145)

## 2019-02-19 LAB — CULTURE, RESPIRATORY W GRAM STAIN

## 2019-02-19 LAB — MAGNESIUM: Magnesium: 2 mg/dL (ref 1.7–2.4)

## 2019-02-19 LAB — PHOSPHORUS: Phosphorus: 3.5 mg/dL (ref 2.5–4.6)

## 2019-02-19 NOTE — Progress Notes (Signed)
Pulmonary Critical Care Medicine [facility] [place]   PULMONARY CRITICAL CARE SERVICE  PROGRESS NOTE  Date of Service: 02/19/2019  Raymond Avila  YBO:[id number]  DOB: 11-16-1965   DOA: 12/23/2018  Referring Physician: [name], MD  HPI: Raymond Avila is a 54 y.o. male seen for follow up of Acute on Chronic Respiratory Failure.  Patient is on T collar right now on 28% FiO2 good saturations are noted  Medications: Reviewed on Rounds  Physical Exam:  Vitals: Temperature 97.6 pulse 83 respiratory 23 blood pressure is 167/92 saturations 96%  Ventilator Settings on T collar currently on 28% FiO2  . General: Comfortable at this time . Eyes: Grossly normal lids, irises & conjunctiva . ENT: grossly tongue is normal . Neck: no obvious mass . Cardiovascular: S1 S2 normal no gallop . Respiratory: No rhonchi no rales are noted at this time . Abdomen: soft . Skin: no rash seen on limited exam . Musculoskeletal: not rigid . Psychiatric:unable to assess . Neurologic: no seizure no involuntary movements         Lab Data:   Basic Metabolic Panel: Recent Labs  Lab 02/14/19 1035 02/17/19 1015 02/18/19 0611 02/19/19 0727  NA 135 141  --  143  K 3.9 3.3* 3.4* 3.4*  CL 100 103  --  102  CO2 27 34*  --  30  GLUCOSE 158* 118*  --  164*  BUN 22* 17  --  14  CREATININE 0.36* 0.30*  --  0.46*  CALCIUM 9.5 9.1  --  9.2  MG 2.0  --   --  2.0  PHOS  --   --   --  3.5    ABG: No results for input(s): PHART, PCO2ART, PO2ART, HCO3, O2SAT in the last 168 hours.  Liver Function Tests: No results for input(s): AST, ALT, ALKPHOS, BILITOT, PROT, ALBUMIN in the last 168 hours. No results for input(s): LIPASE, AMYLASE in the last 168 hours. No results for input(s): AMMONIA in the last 168 hours.  CBC: Recent Labs  Lab 02/14/19 1035 02/17/19 1015 02/19/19 0727  WBC 21.7* 7.0 8.2  HGB 8.8* 8.2* 8.3*  HCT 29.0* 27.1* 27.4*  MCV 94.5 91.9 92.3  PLT 385 354 388     Cardiac Enzymes: No results for input(s): CKTOTAL, CKMB, CKMBINDEX, TROPONINI in the last 168 hours.  BNP (last 3 results) No results for input(s): BNP in the last 8760 hours.  ProBNP (last 3 results) No results for input(s): PROBNP in the last 8760 hours.  Radiological Exams: No results found.  Assessment/Plan Active Problems:   Acute on chronic respiratory failure with hypoxia (HCC)   Lobar pneumonia, unspecified organism (HCC)   Chronic congestive heart failure (HCC)   Closed C7 fracture without spinal cord injury, sequela   Pulmonary embolism and infarction (HCC)   1. Acute on chronic respiratory failure with hypoxia continue with T collar trials titrate oxygen continue pulmonary toilet 2. Lobar pneumonia treated clinically is improving although still has significant retained secretions 3. Chronic congestive heart failure at baseline we will continue to follow. 4. C7 fracture no change 5. Pulmonary embolism at baseline treated   I have personally seen and evaluated the patient, evaluated laboratory and imaging results, formulated the assessment and plan and placed orders. The Patient requires high complexity decision making with multiple systems involvement.  Rounds were done with the Respiratory Therapy Director and Staff therapists and discussed with nursing staff also.  Yevonne Pax, MD [facility] Pulmonary Critical  Care Medicine Sleep Medicine

## 2019-02-20 DIAGNOSIS — S12690S Other displaced fracture of seventh cervical vertebra, sequela: Secondary | ICD-10-CM | POA: Diagnosis not present

## 2019-02-20 DIAGNOSIS — J9621 Acute and chronic respiratory failure with hypoxia: Secondary | ICD-10-CM | POA: Diagnosis not present

## 2019-02-20 DIAGNOSIS — J181 Lobar pneumonia, unspecified organism: Secondary | ICD-10-CM | POA: Diagnosis not present

## 2019-02-20 DIAGNOSIS — I509 Heart failure, unspecified: Secondary | ICD-10-CM | POA: Diagnosis not present

## 2019-02-20 LAB — POTASSIUM: Potassium: 3.6 mmol/L (ref 3.5–5.1)

## 2019-02-20 NOTE — Progress Notes (Addendum)
Pulmonary Critical Care Medicine [facility] [place]   PULMONARY CRITICAL CARE SERVICE  PROGRESS NOTE  Date of Service: 02/20/2019  Raymond Avila  [id number]:[id number]  DOB: 11-23-1965   DOA: 12/23/2018  Referring Physician: [name], MD  HPI: Raymond Avila is a 54 y.o. male seen for follow up of Acute on Chronic Respiratory Failure.  20% aerosol trach collar using PMV during the day satting well no distress.  Medications: Reviewed on Rounds  Physical Exam:  Vitals: Pulse 88 respirations 21 BP 131/72 O2 sat 98% temp 97.3  Ventilator Settings 28% aerosol trach collar  . General: Comfortable at this time . Eyes: Grossly normal lids, irises & conjunctiva . ENT: grossly tongue is normal . Neck: no obvious mass . Cardiovascular: S1 S2 normal no gallop . Respiratory: No rales or rhonchi noted . Abdomen: soft . Skin: no rash seen on limited exam . Musculoskeletal: not rigid . Psychiatric:unable to assess . Neurologic: no seizure no involuntary movements         Lab Data:   Basic Metabolic Panel: Recent Labs  Lab 02/14/19 1035 02/17/19 1015 02/18/19 0611 02/19/19 0727 02/20/19 0600 02/20/19 1547  NA 135 141  --  143  --   --   K 3.9 3.3* 3.4* 3.4* SPECIMEN HEMOLYZED. HEMOLYSIS MAY AFFECT INTEGRITY OF RESULTS. 3.6  CL 100 103  --  102  --   --   CO2 27 34*  --  30  --   --   GLUCOSE 158* 118*  --  164*  --   --   BUN 22* 17  --  14  --   --   CREATININE 0.36* 0.30*  --  0.46*  --   --   CALCIUM 9.5 9.1  --  9.2  --   --   MG 2.0  --   --  2.0  --   --   PHOS  --   --   --  3.5  --   --     ABG: No results for input(s): PHART, PCO2ART, PO2ART, HCO3, O2SAT in the last 168 hours.  Liver Function Tests: No results for input(s): AST, ALT, ALKPHOS, BILITOT, PROT, ALBUMIN in the last 168 hours. No results for input(s): LIPASE, AMYLASE in the last 168 hours. No results for input(s): AMMONIA in the last 168 hours.  CBC: Recent Labs  Lab 02/14/19 1035  02/17/19 1015 02/19/19 0727  WBC 21.7* 7.0 8.2  HGB 8.8* 8.2* 8.3*  HCT 29.0* 27.1* 27.4*  MCV 94.5 91.9 92.3  PLT 385 354 388    Cardiac Enzymes: No results for input(s): CKTOTAL, CKMB, CKMBINDEX, TROPONINI in the last 168 hours.  BNP (last 3 results) No results for input(s): BNP in the last 8760 hours.  ProBNP (last 3 results) No results for input(s): PROBNP in the last 8760 hours.  Radiological Exams: No results found.  Assessment/Plan Active Problems:   Acute on chronic respiratory failure with hypoxia (HCC)   Lobar pneumonia, unspecified organism (HCC)   Chronic congestive heart failure (HCC)   Closed C7 fracture without spinal cord injury, sequela   Pulmonary embolism and infarction (HCC)   1. Acute on chronic respiratory failure with hypoxia continue with T collar trials titrate oxygen continue pulmonary toilet 2. Lobar pneumonia treated clinically is improving although still has significant retained secretions 3. Chronic congestive heart failure at baseline we will continue to follow. 4. C7 fracture no change 5. Pulmonary embolism at baseline treated  I have personally seen and evaluated the patient, evaluated laboratory and imaging results, formulated the assessment and plan and placed orders. The Patient requires high complexity decision making with multiple systems involvement.  Rounds were done with the Respiratory Therapy Director and Staff therapists and discussed with nursing staff also.  Allyne Gee, MD [facility] Pulmonary Critical Care Medicine Sleep Medicine

## 2019-02-21 DIAGNOSIS — J181 Lobar pneumonia, unspecified organism: Secondary | ICD-10-CM | POA: Diagnosis not present

## 2019-02-21 DIAGNOSIS — I509 Heart failure, unspecified: Secondary | ICD-10-CM | POA: Diagnosis not present

## 2019-02-21 DIAGNOSIS — S12690S Other displaced fracture of seventh cervical vertebra, sequela: Secondary | ICD-10-CM | POA: Diagnosis not present

## 2019-02-21 DIAGNOSIS — J9621 Acute and chronic respiratory failure with hypoxia: Secondary | ICD-10-CM | POA: Diagnosis not present

## 2019-02-21 LAB — BASIC METABOLIC PANEL
Anion gap: 11 (ref 5–15)
BUN: 26 mg/dL — ABNORMAL HIGH (ref 6–20)
CO2: 28 mmol/L (ref 22–32)
Calcium: 9.2 mg/dL (ref 8.9–10.3)
Chloride: 109 mmol/L (ref 98–111)
Creatinine, Ser: 1 mg/dL (ref 0.61–1.24)
GFR calc Af Amer: >60 mL/min (ref >60)
GFR calc non Af Amer: >60 mL/min (ref >60)
Glucose, Bld: 153 mg/dL — ABNORMAL HIGH (ref 70–99)
Potassium: 3.3 mmol/L — ABNORMAL LOW (ref 3.5–5.1)
Sodium: 148 mmol/L — ABNORMAL HIGH (ref 135–145)

## 2019-02-21 LAB — CBC
HCT: 30.9 % — ABNORMAL LOW (ref 39.0–52.0)
Hemoglobin: 9.1 g/dL — ABNORMAL LOW (ref 13.0–17.0)
MCH: 27.8 pg (ref 26.0–34.0)
MCHC: 29.4 g/dL — ABNORMAL LOW (ref 30.0–36.0)
MCV: 94.5 fL (ref 80.0–100.0)
Platelets: 400 10*3/uL (ref 150–400)
RBC: 3.27 MIL/uL — ABNORMAL LOW (ref 4.22–5.81)
RDW: 16.4 % — ABNORMAL HIGH (ref 11.5–15.5)
WBC: 9.7 10*3/uL (ref 4.0–10.5)
nRBC: 0 % (ref 0.0–0.2)

## 2019-02-21 LAB — PHOSPHORUS: Phosphorus: 4.8 mg/dL — ABNORMAL HIGH (ref 2.5–4.6)

## 2019-02-21 LAB — MAGNESIUM: Magnesium: 2.5 mg/dL — ABNORMAL HIGH (ref 1.7–2.4)

## 2019-02-21 NOTE — Progress Notes (Signed)
Pulmonary Critical Care Medicine [facility] [place]   PULMONARY CRITICAL CARE SERVICE  PROGRESS NOTE  Date of Service: 02/21/2019  AIVEN KAMPE  FIE:[id number]  DOB: Sep 19, 1965   DOA: 12/23/2018  Referring Physician: [name], MD  HPI: TODD JELINSKI is a 54 y.o. male seen for follow up of Acute on Chronic Respiratory Failure.  Patient currently is on T collar has got will moderate amount of secretions requiring frequent suctioning  Medications: Reviewed on Rounds  Physical Exam:  Vitals: Temperature 96.7 pulse 80 respiratory rate 20 blood pressure is 161/91 saturations 99%  Ventilator Settings on T collar currently on 28% FiO2  . General: Comfortable at this time . Eyes: Grossly normal lids, irises & conjunctiva . ENT: grossly tongue is normal . Neck: no obvious mass . Cardiovascular: S1 S2 normal no gallop . Respiratory: No rhonchi no rales are noted at this time . Abdomen: soft . Skin: no rash seen on limited exam . Musculoskeletal: not rigid . Psychiatric:unable to assess . Neurologic: no seizure no involuntary movements         Lab Data:   Basic Metabolic Panel: Recent Labs  Lab 02/17/19 1015 02/18/19 0611 02/19/19 0727 02/20/19 0600 02/20/19 1547 02/21/19 0610  NA 141  --  143  --   --  148*  K 3.3* 3.4* 3.4* SPECIMEN HEMOLYZED. HEMOLYSIS MAY AFFECT INTEGRITY OF RESULTS. 3.6 3.3*  CL 103  --  102  --   --  109  CO2 34*  --  30  --   --  28  GLUCOSE 118*  --  164*  --   --  153*  BUN 17  --  14  --   --  26*  CREATININE 0.30*  --  0.46*  --   --  1.00  CALCIUM 9.1  --  9.2  --   --  9.2  MG  --   --  2.0  --   --  2.5*  PHOS  --   --  3.5  --   --  4.8*    ABG: No results for input(s): PHART, PCO2ART, PO2ART, HCO3, O2SAT in the last 168 hours.  Liver Function Tests: No results for input(s): AST, ALT, ALKPHOS, BILITOT, PROT, ALBUMIN in the last 168 hours. No results for input(s): LIPASE, AMYLASE in the last 168 hours. No results  for input(s): AMMONIA in the last 168 hours.  CBC: Recent Labs  Lab 02/17/19 1015 02/19/19 0727 02/21/19 0610  WBC 7.0 8.2 9.7  HGB 8.2* 8.3* 9.1*  HCT 27.1* 27.4* 30.9*  MCV 91.9 92.3 94.5  PLT 354 388 400    Cardiac Enzymes: No results for input(s): CKTOTAL, CKMB, CKMBINDEX, TROPONINI in the last 168 hours.  BNP (last 3 results) No results for input(s): BNP in the last 8760 hours.  ProBNP (last 3 results) No results for input(s): PROBNP in the last 8760 hours.  Radiological Exams: No results found.  Assessment/Plan Active Problems:   Acute on chronic respiratory failure with hypoxia (HCC)   Lobar pneumonia, unspecified organism (HCC)   Chronic congestive heart failure (HCC)   Closed C7 fracture without spinal cord injury, sequela   Pulmonary embolism and infarction (HCC)   1. Acute on chronic respiratory failure with hypoxia the plan is to continue with T collar trials titrate oxygen continue pulmonary toilet. 2. Lobar pneumonia unspecified we will continue present management 3. Chronic congestive heart failure at baseline 4. Closed C7 fracture status post ACDF 5.  Pulmonary embolism treated continue with anticoagulation therapy as tolerated   I have personally seen and evaluated the patient, evaluated laboratory and imaging results, formulated the assessment and plan and placed orders. The Patient requires high complexity decision making with multiple systems involvement.  Rounds were done with the Respiratory Therapy Director and Staff therapists and discussed with nursing staff also.  Allyne Gee, MD [facility] Pulmonary Critical Care Medicine Sleep Medicine

## 2019-02-22 DIAGNOSIS — I509 Heart failure, unspecified: Secondary | ICD-10-CM | POA: Diagnosis not present

## 2019-02-22 DIAGNOSIS — S12690S Other displaced fracture of seventh cervical vertebra, sequela: Secondary | ICD-10-CM | POA: Diagnosis not present

## 2019-02-22 DIAGNOSIS — J181 Lobar pneumonia, unspecified organism: Secondary | ICD-10-CM | POA: Diagnosis not present

## 2019-02-22 DIAGNOSIS — J9621 Acute and chronic respiratory failure with hypoxia: Secondary | ICD-10-CM | POA: Diagnosis not present

## 2019-02-22 LAB — BASIC METABOLIC PANEL
Anion gap: 12 (ref 5–15)
BUN: 32 mg/dL — ABNORMAL HIGH (ref 6–20)
CO2: 31 mmol/L (ref 22–32)
Calcium: 9.3 mg/dL (ref 8.9–10.3)
Chloride: 111 mmol/L (ref 98–111)
Creatinine, Ser: 1.37 mg/dL — ABNORMAL HIGH (ref 0.61–1.24)
GFR calc Af Amer: >60 mL/min (ref >60)
GFR calc non Af Amer: 59 mL/min — ABNORMAL LOW (ref >60)
Glucose, Bld: 167 mg/dL — ABNORMAL HIGH (ref 70–99)
Potassium: 3.7 mmol/L (ref 3.5–5.1)
Sodium: 154 mmol/L — ABNORMAL HIGH (ref 135–145)

## 2019-02-22 NOTE — Progress Notes (Signed)
Pulmonary Critical Care Medicine [facility] [place]   PULMONARY CRITICAL CARE SERVICE  PROGRESS NOTE  Date of Service: 02/22/2019  Raymond Avila  TTS:[id number]  DOB: March 07, 1965   DOA: 12/23/2018  Referring Physician: [name], MD  HPI: Raymond Avila is a 54 y.o. male seen for follow up of Acute on Chronic Respiratory Failure.  Patient currently is on T collar has been on 20% FiO2 with good saturations  Medications: Reviewed on Rounds  Physical Exam:  Vitals: Temperature 98.4 pulse 85 respiratory 18 blood pressure 155/88 saturations 96%  Ventilator Settings on T collar currently on 28% FiO2  . General: Comfortable at this time . Eyes: Grossly normal lids, irises & conjunctiva . ENT: grossly tongue is normal . Neck: no obvious mass . Cardiovascular: S1 S2 normal no gallop . Respiratory: No rhonchi no rales are noted at this time . Abdomen: soft . Skin: no rash seen on limited exam . Musculoskeletal: not rigid . Psychiatric:unable to assess . Neurologic: no seizure no involuntary movements         Lab Data:   Basic Metabolic Panel: Recent Labs  Lab 02/17/19 1015 02/19/19 0727 02/20/19 0600 02/20/19 1547 02/21/19 0610 02/22/19 0532  NA 141 143  --   --  148* 154*  K 3.3* 3.4* SPECIMEN HEMOLYZED. HEMOLYSIS MAY AFFECT INTEGRITY OF RESULTS. 3.6 3.3* 3.7  CL 103 102  --   --  109 111  CO2 34* 30  --   --  28 31  GLUCOSE 118* 164*  --   --  153* 167*  BUN 17 14  --   --  26* 32*  CREATININE 0.30* 0.46*  --   --  1.00 1.37*  CALCIUM 9.1 9.2  --   --  9.2 9.3  MG  --  2.0  --   --  2.5*  --   PHOS  --  3.5  --   --  4.8*  --     ABG: No results for input(s): PHART, PCO2ART, PO2ART, HCO3, O2SAT in the last 168 hours.  Liver Function Tests: No results for input(s): AST, ALT, ALKPHOS, BILITOT, PROT, ALBUMIN in the last 168 hours. No results for input(s): LIPASE, AMYLASE in the last 168 hours. No results for input(s): AMMONIA in the last 168  hours.  CBC: Recent Labs  Lab 02/17/19 1015 02/19/19 0727 02/21/19 0610  WBC 7.0 8.2 9.7  HGB 8.2* 8.3* 9.1*  HCT 27.1* 27.4* 30.9*  MCV 91.9 92.3 94.5  PLT 354 388 400    Cardiac Enzymes: No results for input(s): CKTOTAL, CKMB, CKMBINDEX, TROPONINI in the last 168 hours.  BNP (last 3 results) No results for input(s): BNP in the last 8760 hours.  ProBNP (last 3 results) No results for input(s): PROBNP in the last 8760 hours.  Radiological Exams: No results found.  Assessment/Plan Active Problems:   Acute on chronic respiratory failure with hypoxia (HCC)   Lobar pneumonia, unspecified organism (HCC)   Chronic congestive heart failure (HCC)   Closed C7 fracture without spinal cord injury, sequela   Pulmonary embolism and infarction (HCC)   1. Acute on chronic respiratory failure hypoxia plan is to continue with weaning on T collar patient secretions still remain an issue 2. Lobar pneumonia treated clinically is improving 3. Chronic congestive heart failure baseline we will continue present therapy 4. C7 fracture no changes 5. Pulmonary embolism treated we will continue to follow   I have personally seen and evaluated the patient,  evaluated laboratory and imaging results, formulated the assessment and plan and placed orders. The Patient requires high complexity decision making with multiple systems involvement.  Rounds were done with the Respiratory Therapy Director and Staff therapists and discussed with nursing staff also.  Allyne Gee, MD [facility] Pulmonary Critical Care Medicine Sleep Medicine

## 2019-02-23 DIAGNOSIS — J181 Lobar pneumonia, unspecified organism: Secondary | ICD-10-CM | POA: Diagnosis not present

## 2019-02-23 DIAGNOSIS — S12690S Other displaced fracture of seventh cervical vertebra, sequela: Secondary | ICD-10-CM | POA: Diagnosis not present

## 2019-02-23 DIAGNOSIS — I509 Heart failure, unspecified: Secondary | ICD-10-CM | POA: Diagnosis not present

## 2019-02-23 DIAGNOSIS — J9621 Acute and chronic respiratory failure with hypoxia: Secondary | ICD-10-CM | POA: Diagnosis not present

## 2019-02-23 LAB — CBC
HCT: 29.1 % — ABNORMAL LOW (ref 39.0–52.0)
Hemoglobin: 8.3 g/dL — ABNORMAL LOW (ref 13.0–17.0)
MCH: 28.3 pg (ref 26.0–34.0)
MCHC: 28.5 g/dL — ABNORMAL LOW (ref 30.0–36.0)
MCV: 99.3 fL (ref 80.0–100.0)
Platelets: 396 10*3/uL (ref 150–400)
RBC: 2.93 MIL/uL — ABNORMAL LOW (ref 4.22–5.81)
RDW: 17.2 % — ABNORMAL HIGH (ref 11.5–15.5)
WBC: 10.2 10*3/uL (ref 4.0–10.5)
nRBC: 0 % (ref 0.0–0.2)

## 2019-02-23 LAB — BASIC METABOLIC PANEL
Anion gap: 10 (ref 5–15)
BUN: 37 mg/dL — ABNORMAL HIGH (ref 6–20)
CO2: 33 mmol/L — ABNORMAL HIGH (ref 22–32)
Calcium: 8.9 mg/dL (ref 8.9–10.3)
Chloride: 112 mmol/L — ABNORMAL HIGH (ref 98–111)
Creatinine, Ser: 1.37 mg/dL — ABNORMAL HIGH (ref 0.61–1.24)
GFR calc Af Amer: >60 mL/min (ref >60)
GFR calc non Af Amer: 59 mL/min — ABNORMAL LOW (ref >60)
Glucose, Bld: 157 mg/dL — ABNORMAL HIGH (ref 70–99)
Potassium: 3.7 mmol/L (ref 3.5–5.1)
Sodium: 155 mmol/L — ABNORMAL HIGH (ref 135–145)

## 2019-02-23 LAB — MAGNESIUM: Magnesium: 2.8 mg/dL — ABNORMAL HIGH (ref 1.7–2.4)

## 2019-02-23 LAB — VANCOMYCIN, TROUGH
Vancomycin Tr: 120 ug/mL (ref 15–20)
Vancomycin Tr: 101 ug/mL (ref 15–20)

## 2019-02-23 NOTE — Progress Notes (Signed)
Pulmonary Critical Care Medicine [facility] [place]   PULMONARY CRITICAL CARE SERVICE  PROGRESS NOTE  Date of Service: 02/23/2019  Raymond Avila  [id number]  DOB: May 27, 1965   DOA: 12/23/2018  Referring Physician: [name], MD  HPI: Raymond Avila is a 54 y.o. male seen for follow up of Acute on Chronic Respiratory Failure.  Patient is comfortable right now without distress is on T collar has been on 28% FiO2 with the PMV in place  Medications: Reviewed on Rounds  Physical Exam:  Vitals: Temperature 97.6 pulse 86 respiratory rate 18 blood pressure is 127/76 saturations 97%  Ventilator Settings on T collar now 28% FiO2 using PMV  . General: Comfortable at this time . Eyes: Grossly normal lids, irises & conjunctiva . ENT: grossly tongue is normal . Neck: no obvious mass . Cardiovascular: S1 S2 normal no gallop . Respiratory: No rhonchi coarse breath sounds are noted . Abdomen: soft . Skin: no rash seen on limited exam . Musculoskeletal: not rigid . Psychiatric:unable to assess . Neurologic: no seizure no involuntary movements         Lab Data:   Basic Metabolic Panel: Recent Labs  Lab 02/17/19 1015 02/19/19 0727 02/20/19 0600 02/20/19 1547 02/21/19 0610 02/22/19 0532 02/23/19 0737  NA 141 143  --   --  148* 154* 155*  K 3.3* 3.4* SPECIMEN HEMOLYZED. HEMOLYSIS MAY AFFECT INTEGRITY OF RESULTS. 3.6 3.3* 3.7 3.7  CL 103 102  --   --  109 111 112*  CO2 34* 30  --   --  28 31 33*  GLUCOSE 118* 164*  --   --  153* 167* 157*  BUN 17 14  --   --  26* 32* 37*  CREATININE 0.30* 0.46*  --   --  1.00 1.37* 1.37*  CALCIUM 9.1 9.2  --   --  9.2 9.3 8.9  MG  --  2.0  --   --  2.5*  --  2.8*  PHOS  --  3.5  --   --  4.8*  --   --     ABG: No results for input(s): PHART, PCO2ART, PO2ART, HCO3, O2SAT in the last 168 hours.  Liver Function Tests: No results for input(s): AST, ALT, ALKPHOS, BILITOT, PROT, ALBUMIN in the last 168 hours. No results for  input(s): LIPASE, AMYLASE in the last 168 hours. No results for input(s): AMMONIA in the last 168 hours.  CBC: Recent Labs  Lab 02/17/19 1015 02/19/19 0727 02/21/19 0610 02/23/19 0737  WBC 7.0 8.2 9.7 10.2  HGB 8.2* 8.3* 9.1* 8.3*  HCT 27.1* 27.4* 30.9* 29.1*  MCV 91.9 92.3 94.5 99.3  PLT 354 388 400 396    Cardiac Enzymes: No results for input(s): CKTOTAL, CKMB, CKMBINDEX, TROPONINI in the last 168 hours.  BNP (last 3 results) No results for input(s): BNP in the last 8760 hours.  ProBNP (last 3 results) No results for input(s): PROBNP in the last 8760 hours.  Radiological Exams: No results found.  Assessment/Plan Active Problems:   Acute on chronic respiratory failure with hypoxia (HCC)   Lobar pneumonia, unspecified organism (HCC)   Chronic congestive heart failure (HCC)   Closed C7 fracture without spinal cord injury, sequela   Pulmonary embolism and infarction (HCC)   1. Acute on chronic respiratory failure with hypoxia plan is to continue with T collar trials use PMV as tolerated 2. Lobar pneumonia treated improving 3. Chronic congestive heart failure compensated 4. C7 fracture status  post ACDF 5. Pulmonary embolism treated we will continue with present management   I have personally seen and evaluated the patient, evaluated laboratory and imaging results, formulated the assessment and plan and placed orders. The Patient requires high complexity decision making with multiple systems involvement.  Rounds were done with the Respiratory Therapy Director and Staff therapists and discussed with nursing staff also.  Allyne Gee, MD [facility] Pulmonary Critical Care Medicine Sleep Medicine

## 2019-02-24 DIAGNOSIS — I509 Heart failure, unspecified: Secondary | ICD-10-CM | POA: Diagnosis not present

## 2019-02-24 DIAGNOSIS — J181 Lobar pneumonia, unspecified organism: Secondary | ICD-10-CM | POA: Diagnosis not present

## 2019-02-24 DIAGNOSIS — J9621 Acute and chronic respiratory failure with hypoxia: Secondary | ICD-10-CM | POA: Diagnosis not present

## 2019-02-24 DIAGNOSIS — S12690S Other displaced fracture of seventh cervical vertebra, sequela: Secondary | ICD-10-CM | POA: Diagnosis not present

## 2019-02-24 LAB — RENAL FUNCTION PANEL
Albumin: 1.7 g/dL — ABNORMAL LOW (ref 3.5–5.0)
Anion gap: 9 (ref 5–15)
BUN: 41 mg/dL — ABNORMAL HIGH (ref 6–20)
CO2: 30 mmol/L (ref 22–32)
Calcium: 9 mg/dL (ref 8.9–10.3)
Chloride: 109 mmol/L (ref 98–111)
Creatinine, Ser: 1.58 mg/dL — ABNORMAL HIGH (ref 0.61–1.24)
GFR calc Af Amer: 58 mL/min — ABNORMAL LOW (ref >60)
GFR calc non Af Amer: 50 mL/min — ABNORMAL LOW (ref >60)
Glucose, Bld: 164 mg/dL — ABNORMAL HIGH (ref 70–99)
Phosphorus: 3.6 mg/dL (ref 2.5–4.6)
Potassium: 3.9 mmol/L (ref 3.5–5.1)
Sodium: 148 mmol/L — ABNORMAL HIGH (ref 135–145)

## 2019-02-24 LAB — CBC
HCT: 26.3 % — ABNORMAL LOW (ref 39.0–52.0)
Hemoglobin: 7.7 g/dL — ABNORMAL LOW (ref 13.0–17.0)
MCH: 27.9 pg (ref 26.0–34.0)
MCHC: 29.3 g/dL — ABNORMAL LOW (ref 30.0–36.0)
MCV: 95.3 fL (ref 80.0–100.0)
Platelets: 375 10*3/uL (ref 150–400)
RBC: 2.76 MIL/uL — ABNORMAL LOW (ref 4.22–5.81)
RDW: 17 % — ABNORMAL HIGH (ref 11.5–15.5)
WBC: 11.6 10*3/uL — ABNORMAL HIGH (ref 4.0–10.5)
nRBC: 0 % (ref 0.0–0.2)

## 2019-02-24 LAB — MAGNESIUM: Magnesium: 2.8 mg/dL — ABNORMAL HIGH (ref 1.7–2.4)

## 2019-02-24 NOTE — Progress Notes (Signed)
Pulmonary Critical Care Medicine [facility] [place]   PULMONARY CRITICAL CARE SERVICE  PROGRESS NOTE  Date of Service: 02/24/2019  Raymond Avila  EZM:[id number]  DOB: [birth year]   DOA: 12/23/2018  Referring Physician: [name], MD  HPI: Raymond Avila is a 54 y.o. male seen for follow up of Acute on Chronic Respiratory Failure.  Patient is on T collar doing well on 28% FiO2 right now but saturations are noted  Medications: Reviewed on Rounds  Physical Exam:  Vitals: Temperature 99.5 pulse 86 respiratory rate 17 blood pressure is 151/81 saturations 98%  Ventilator Settings off the ventilator right now on T collar with an FiO2 of 28%  . General: Comfortable at this time . Eyes: Grossly normal lids, irises & conjunctiva . ENT: grossly tongue is normal . Neck: no obvious mass . Cardiovascular: S1 S2 normal no gallop . Respiratory: No rhonchi no rales are noted at this time . Abdomen: soft . Skin: no rash seen on limited exam . Musculoskeletal: not rigid . Psychiatric:unable to assess . Neurologic: no seizure no involuntary movements         Lab Data:   Basic Metabolic Panel: Recent Labs  Lab 02/19/19 0727 02/20/19 1547 02/21/19 0610 02/22/19 0532 02/23/19 0737 02/24/19 1022  NA 143  --  148* 154* 155* 148*  K 3.4* 3.6 3.3* 3.7 3.7 3.9  CL 102  --  109 111 112* 109  CO2 30  --  28 31 33* 30  GLUCOSE 164*  --  153* 167* 157* 164*  BUN 14  --  26* 32* 37* 41*  CREATININE 0.46*  --  1.00 1.37* 1.37* 1.58*  CALCIUM 9.2  --  9.2 9.3 8.9 9.0  MG 2.0  --  2.5*  --  2.8* 2.8*  PHOS 3.5  --  4.8*  --   --  3.6    ABG: No results for input(s): PHART, PCO2ART, PO2ART, HCO3, O2SAT in the last 168 hours.  Liver Function Tests: Recent Labs  Lab 02/24/19 1022  ALBUMIN 1.7*   No results for input(s): LIPASE, AMYLASE in the last 168 hours. No results for input(s): AMMONIA in the last 168 hours.  CBC: Recent Labs  Lab 02/19/19 0727 02/21/19 0610  02/23/19 0737 02/24/19 1022  WBC 8.2 9.7 10.2 11.6*  HGB 8.3* 9.1* 8.3* 7.7*  HCT 27.4* 30.9* 29.1* 26.3*  MCV 92.3 94.5 99.3 95.3  PLT 388 400 396 375    Cardiac Enzymes: No results for input(s): CKTOTAL, CKMB, CKMBINDEX, TROPONINI in the last 168 hours.  BNP (last 3 results) No results for input(s): BNP in the last 8760 hours.  ProBNP (last 3 results) No results for input(s): PROBNP in the last 8760 hours.  Radiological Exams: No results found.  Assessment/Plan Active Problems:   Acute on chronic respiratory failure with hypoxia (HCC)   Lobar pneumonia, unspecified organism (HCC)   Chronic congestive heart failure (HCC)   Closed C7 fracture without spinal cord injury, sequela   Pulmonary embolism and infarction (HCC)   1. Acute on chronic respiratory failure with hypoxia plan continue with T collar trials and wean as tolerated continue secretion management pulmonary toilet 2. Lobar pneumonia treated we will continue to follow along 3. Chronic congestive heart failure at baseline 4. C7 fracture no change 5. Pulmonary embolism at baseline continue with present management   I have personally seen and evaluated the patient, evaluated laboratory and imaging results, formulated the assessment and plan and placed orders. The  Patient requires high complexity decision making with multiple systems involvement.  Rounds were done with the Respiratory Therapy Director and Staff therapists and discussed with nursing staff also.  Allyne Gee, MD [facility] Pulmonary Critical Care Medicine Sleep Medicine

## 2019-02-25 DIAGNOSIS — J181 Lobar pneumonia, unspecified organism: Secondary | ICD-10-CM | POA: Diagnosis not present

## 2019-02-25 DIAGNOSIS — J9621 Acute and chronic respiratory failure with hypoxia: Secondary | ICD-10-CM | POA: Diagnosis not present

## 2019-02-25 DIAGNOSIS — S12690S Other displaced fracture of seventh cervical vertebra, sequela: Secondary | ICD-10-CM | POA: Diagnosis not present

## 2019-02-25 DIAGNOSIS — I509 Heart failure, unspecified: Secondary | ICD-10-CM | POA: Diagnosis not present

## 2019-02-25 LAB — RENAL FUNCTION PANEL
Albumin: 1.7 g/dL — ABNORMAL LOW (ref 3.5–5.0)
Anion gap: 13 (ref 5–15)
BUN: 41 mg/dL — ABNORMAL HIGH (ref 6–20)
CO2: 29 mmol/L (ref 22–32)
Calcium: 9.1 mg/dL (ref 8.9–10.3)
Chloride: 107 mmol/L (ref 98–111)
Creatinine, Ser: 1.67 mg/dL — ABNORMAL HIGH (ref 0.61–1.24)
GFR calc Af Amer: 54 mL/min — ABNORMAL LOW (ref >60)
GFR calc non Af Amer: 47 mL/min — ABNORMAL LOW (ref >60)
Glucose, Bld: 146 mg/dL — ABNORMAL HIGH (ref 70–99)
Phosphorus: 3.8 mg/dL (ref 2.5–4.6)
Potassium: 4.4 mmol/L (ref 3.5–5.1)
Sodium: 149 mmol/L — ABNORMAL HIGH (ref 135–145)

## 2019-02-25 LAB — CBC
HCT: 25.2 % — ABNORMAL LOW (ref 39.0–52.0)
Hemoglobin: 7.3 g/dL — ABNORMAL LOW (ref 13.0–17.0)
MCH: 27.7 pg (ref 26.0–34.0)
MCHC: 29 g/dL — ABNORMAL LOW (ref 30.0–36.0)
MCV: 95.5 fL (ref 80.0–100.0)
Platelets: 366 10*3/uL (ref 150–400)
RBC: 2.64 MIL/uL — ABNORMAL LOW (ref 4.22–5.81)
RDW: 17 % — ABNORMAL HIGH (ref 11.5–15.5)
WBC: 11.7 10*3/uL — ABNORMAL HIGH (ref 4.0–10.5)
nRBC: 0 % (ref 0.0–0.2)

## 2019-02-25 LAB — VANCOMYCIN, TROUGH: Vancomycin Tr: 58 ug/mL (ref 15–20)

## 2019-02-25 LAB — MAGNESIUM: Magnesium: 2.8 mg/dL — ABNORMAL HIGH (ref 1.7–2.4)

## 2019-02-25 NOTE — Progress Notes (Signed)
PROGRESS NOTE    Raymond Avila  RUE:[id number] DOB: [birth year] DOA: 12/23/2018   Brief Narrative:  Raymond Beecham Shearinis an 54 y.o.malewith morbid obesity, schizoaffective disorder, Tourette syndrome, COPD, hypertension, obstructive sleep apnea who had a recent fall with neck trauma which caused C7/T1jumped facets in his neck with anterior listhesis and severe spinal canalnarrowing with spinal cord injury and paraplegia. He was admitted to outside hospital on 11/23/2018. He required anterior cervical disc fusion and despite this developed paraplegia. Postoperatively patient was intubated and had respiratory complications after that. He developed pneumonia secondary to Klebsiella. He had prolonged ventilation and eventually underwent tracheostomy placement. Patient also had DVT in right lower extremity and left upper extremity which led to pulmonary embolism. He was initially on heparin drip and then transitioned to Eliquis. He also had a feeding tube placed. Apparently posterior surgical fusion was postponed secondary to the patient's medical condition and inability to stay prone for the surgery because of the respiratory failure. Due to his medical problems he was transferred to [facility]. After admission here patient has been having fever spikes. All cultures to date negative. Hewas treated withIV vancomycin, meropenem,fluconazole.However, continued to have fevers despite being on antibiotics. Therefore antibiotics have been discontinued. Thought to be neuroleptic malignant syndrome? Due to psych medications. His psych medications were adjusted.After thatpatient started having diarrhea. He has stool for C. difficile checked in the past and it was negative. He was treated empirically with p.o. vancomycin. 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  CT of the abdomen and pelvis was done on 02/15/2019 showed dense consolidation of the left lower  lobe likely pneumonia.  Left lower lobe bronchus narrowed, debris/mucus.  Progression of the sacral decubitus ulcer without evidence of focal abscess or additional soft tissue gas.  02/25/2019: He continued to have fever. Therefore was started on IV vancomycin, Zosyn. Initial vancomycin trough was low therefore the vancomycin dose was increased. However, now vancomycin trough high. Creatinine also worsening. T-max 100.3. He is on 28% FiO2. He also underwent debridement of his sacral pressure ulcer at bedside. Currently on treatment with IV Zosyn. Vancomycin has been on hold.    Assessment & Plan:   Active Problems:   Acute on chronic respiratory failure with hypoxia (HCC)   Lobar pneumonia, unspecified organism (HCC)   Chronic congestive heart failure (HCC)   Closed C7 fracture without spinal cord injury, sequela   Pulmonary embolism and infarction [facility]) Sacrococcygeal pressure ulcer unstageable Leukocytosis Acute renal insufficiency Diarrhea Right lower extremity/left upper extremity DVT Fevers COPD Obstructive sleep apnea Morbid obesity Protein calorie malnutrition Dysphagia History of schizoaffective disorder   Acute on chronic hypoxemic respiratory failure:Improving. Previouschest x-rayshowedmild right basilar atelectasis or infiltrate. Healready received treatment withIV vancomycin, meropenem, fluconazole. PreviousTracheal aspirate cultures showing rare gram-positive rods, normal respiratory flora. Pulmonary following.  Restarted on fluconazole, Zosyn secondary to leukocytosis, infected sacral pressure ulcer.  02/25/2019: He had low-grade fevers. Therefore vancomycin was also added. However, unfortunately now his creatinine is trending up. Vancomycin trough also was high. Therefore vancomycin is on hold. Continues to have low-grade fevers. However, respiratory status appears to be stable at this time. Pulmonary following. Due to his chronic trach, he is at high risk for  recurrent pneumonia, worsening respiratory failure, recurrent tracheobronchitis.  Pneumonia:Already treated with IV vancomycin, meropenem, fluconazole. Then restarted with IV Zosyn, fluconazole due to his worsening sacral pressure ulcer. IV vancomycin was added but now due to high vancomycin trough it has been on hold. Pulmonary following.He  is stable at this time.  However, high risk for recurrent pneumonia, worsening respiratory failure. If his respiratory statusworsens,would recommend CT imaging for better evaluation.   Fever: Patientwashaving persistent fever since his admission here despite being on antibiotics. Respiratory cultures showing normal flora.Therewas also concern for aspiration. Initiallyfevers were thought to be either secondary to DVT/PE versus neurolept malignant syndrome. His psychiatric medications were adjusted.He was also treated with antimicrobials and then with p.o. vancomycin for diarrhea.  02/25/2019: Due to low-grade fevers and concern for infected sacral ulcer he was restarted on  fluconazole, Zosyn. IV vancomycin was also added. Initial line trough was low therefore the vancomycin dose was increased. However, subsequent trough came back elevated, creatinine also trending up. Therefore vancomycin is on hold. Currently on Zosyn and fluconazole. Will recommend to discontinue the Zosyn and fluconazole and switch to IV cefepime and Flagyl. He continues to have low-grade fevers likely secondary to the sacral pressure ulcer which was debrided at the bedside per the primary team. Continue to monitor. He is at high risk for recurrent and worsening fevers.  Sacrococcygeal pressure ulcer, unstageable: Continue local wound care.   As mentioned above he was restarted on fluconazole, Zosyn. IV vancomycin was added.  02/25/2019: Initial vancomycin trough was low therefore the dose had to be increased. However, with the increased dose patient started having elevated trough with  elevated creatinine. Currently vancomycin is on hold. He had bedside debridement. Has wound VAC in place. Continue to monitor. We will switch to IV cefepime, Flagyl due to ongoing fevers.  Leukocytosis: Patient had elevated WBC count 21.7 at one-point.  CT imaging findings as mentioned above.  Concern for infected sacrococcygeal pressure ulcer.  Already treated with antibiotics in the past. He was Restarted on Zosyn, fluconazole. Due to ongoing fevers and vancomycin was added but initially had a low drop for which dose had to be increased. However, now line trough is high along with elevated creatinine. At the vancomycin is on hold. We will switch to IV cefepime and Flagyl due to ongoing fevers.  Acute renal insufficiency: IV vancomycin was added to his antibiotic regimen due to ongoing fevers. Initial vancomycin trough was low. Therefore vancomycin dose was increased. However, subsequent vancomycin trough was elevated along with elevated creatinine. Currently vancomycin is on hold. Recommend to continue IV hydration and also add albumin to prevent volume overload/edema. Continue to monitor BUN/creatinine closely. We will also discontinue the Zosyn since he is continuing to have low-grade fevers while on the Zosyn and switch to IV cefepime and Flagyl.  Diarrhea: In the past he stool for C. difficile was negative. The diarrhea could be secondary to tube feeds? He was already treated with p.o. vancomycin. Recommended trial of cholestyramine.  Diarrhea improving.  Right lower extremity DVT/left upper extremity DVT/pulmonary embolism: He was on IV heparin at the outside facility now transitioned to Eliquis. Further management per the primary team.  C7 fracture status post ACDF surgery: Surgical site stable. Unfortunately has paraplegia with weakness. Continue supportive management per the primary team.  COPD: Continue medication and management per primary team and pulmonary.  Morbid  obesity/obstructive sleep apnea: Patient admits to not using the CPAP. Continue management per primary team and pulmonary.  Protein calorie malnutrition: Continue management per primary team.  Dysphagia: Unfortunately due to his dysphagia he is high risk for aspiration and worsening respiratory failure secondary to aspiration pneumonia.  Schizoaffective disorder: Medicationsand management per the primary team.  Subjective: He is currently awake and oriented, T-max 100.3. Creatinine  continues to be elevated.  Objective: Temperature 100.3, pulse 98, respiratory rate 20, blood pressure 150/76, oxygen saturation 99%  Examination: General exam:Morbidly obese male, awake, not in any acute distress HEENT:Atraumatic, normocephalic, pupils equal and reactive, ear or nose lesions. Neck:Trach, has c-collar in place Respiratory system:Occasional rhonchi, no wheezing anteriorly Cardiovascular system:S1 &S2 heard, no murmur. Gastrointestinal system:Obese male,soft and nontender. Normal bowel sounds heard. Central nervous system:Weakness in all extremities Extremities:Edema lower extremity Skin: No rashes,sacrococcygeal pressure ulcer unstageable, wound VAC Psychiatry:Mood &affect appropriate.     Data Reviewed: I have personally reviewed following labs and imaging studies  CBC: Recent Labs  Lab 02/19/19 0727 02/21/19 0610 02/23/19 0737 02/24/19 1022 02/25/19 0524  WBC 8.2 9.7 10.2 11.6* 11.7*  HGB 8.3* 9.1* 8.3* 7.7* 7.3*  HCT 27.4* 30.9* 29.1* 26.3* 25.2*  MCV 92.3 94.5 99.3 95.3 95.5  PLT 388 400 396 375 440   Basic Metabolic Panel: Recent Labs  Lab 02/19/19 0727 02/20/19 0600 02/21/19 0610 02/22/19 0532 02/23/19 0737 02/24/19 1022 02/25/19 0524  NA 143  --  148* 154* 155* 148* 149*  K 3.4*   < > 3.3* 3.7 3.7 3.9 4.4  CL 102  --  109 111 112* 109 107  CO2 30  --  28 31 33* 30 29  GLUCOSE 164*  --  153* 167* 157* 164* 146*  BUN 14  --  26* 32* 37* 41*  41*  CREATININE 0.46*  --  1.00 1.37* 1.37* 1.58* 1.67*  CALCIUM 9.2  --  9.2 9.3 8.9 9.0 9.1  MG 2.0  --  2.5*  --  2.8* 2.8* 2.8*  PHOS 3.5  --  4.8*  --   --  3.6 3.8   < > = values in this interval not displayed.   GFR: CrCl cannot be calculated (Unknown ideal weight.). Liver Function Tests: Recent Labs  Lab 02/24/19 1022 02/25/19 0524  ALBUMIN 1.7* 1.7*   No results for input(s): LIPASE, AMYLASE in the last 168 hours. No results for input(s): AMMONIA in the last 168 hours. Coagulation Profile: No results for input(s): INR, PROTIME in the last 168 hours. Cardiac Enzymes: No results for input(s): CKTOTAL, CKMB, CKMBINDEX, TROPONINI in the last 168 hours. BNP (last 3 results) No results for input(s): PROBNP in the last 8760 hours. HbA1C: No results for input(s): HGBA1C in the last 72 hours. CBG: No results for input(s): GLUCAP in the last 168 hours. Lipid Profile: No results for input(s): CHOL, HDL, LDLCALC, TRIG, CHOLHDL, LDLDIRECT in the last 72 hours. Thyroid Function Tests: No results for input(s): TSH, T4TOTAL, FREET4, T3FREE, THYROIDAB in the last 72 hours. Anemia Panel: No results for input(s): VITAMINB12, FOLATE, FERRITIN, TIBC, IRON, RETICCTPCT in the last 72 hours. Sepsis Labs: No results for input(s): PROCALCITON, LATICACIDVEN in the last 168 hours.  Recent Results (from the past 240 hour(s))  Acid Fast Smear (AFB)     Status: None   Collection Time: 02/17/19  9:54 AM   Specimen: Bronchoalveolar Lavage; Respiratory  Result Value Ref Range Status   AFB Specimen Processing Concentration  Final   Acid Fast Smear Negative  Final    Comment: (NOTE) Performed At: [facility] [place], [place] [id number] Rush Farmer MD YQ:[id number]    Source (AFB) BRONCHIAL ALVEOLAR LAVAGE  Final    Comment: Performed at [facility], [place] [address], [place], [place] [address]  Fungus Culture With Stain     Status: None (Preliminary result)    Collection  Time: 02/17/19  9:54 AM  Result Value Ref Range Status   Fungus Stain Final report  Final    Comment: (NOTE) Performed At: [facility] [place], [place] [id number] Rush Farmer MD ZV:[id number]    Fungus (Mycology) Culture PENDING  Incomplete   Fungal Source BRONCHIAL ALVEOLAR LAVAGE  Final    Comment: Performed at [facility], [place] [address], [place], [place] [address]  Fungus Culture Result     Status: None   Collection Time: 02/17/19  9:54 AM  Result Value Ref Range Status   Result 1 Comment  Final    Comment: (NOTE) KOH/Calcofluor preparation:  no fungus observed. Performed At: [facility] [place], [place] [id number] Rush Farmer MD LK:[id number]   Culture, respiratory (non-expectorated)     Status: None   Collection Time: 02/17/19 10:00 AM   Specimen: Bronchoalveolar Lavage; Respiratory  Result Value Ref Range Status   Specimen Description BRONCHIAL ALVEOLAR LAVAGE  Final   Special Requests NONE  Final   Gram Stain   Final    FEW WBC PRESENT, PREDOMINANTLY PMN NO ORGANISMS SEEN    Culture RARE STAPHYLOCOCCUS AUREUS  Final   Report Status 02/19/2019 FINAL  Final   Organism ID, Bacteria STAPHYLOCOCCUS AUREUS  Final      Susceptibility   Staphylococcus aureus - MIC*    CIPROFLOXACIN <=0.5 SENSITIVE Sensitive     ERYTHROMYCIN RESISTANT Resistant     GENTAMICIN <=0.5 SENSITIVE Sensitive     OXACILLIN 0.5 SENSITIVE Sensitive     TETRACYCLINE <=1 SENSITIVE Sensitive     VANCOMYCIN <=0.5 SENSITIVE Sensitive     TRIMETH/SULFA <=10 SENSITIVE Sensitive     CLINDAMYCIN RESISTANT Resistant     RIFAMPIN <=0.5 SENSITIVE Sensitive     Inducible Clindamycin POSITIVE Resistant     * RARE STAPHYLOCOCCUS AUREUS         Radiology Studies: No results found.      Scheduled Meds: Please see MAR    Yaakov Guthrie, MD 02/25/2019, 2:25 PM

## 2019-02-25 NOTE — Progress Notes (Signed)
Pulmonary Critical Care Medicine [facility] [place]   PULMONARY CRITICAL CARE SERVICE  PROGRESS NOTE  Date of Service: 02/25/2019  Raymond Avila  TMH:[id number]  DOB: Oct 24, 1965   DOA: 12/23/2018  Referring Physician: [name], MD  HPI: Raymond Avila is a 54 y.o. male seen for follow up of Acute on Chronic Respiratory Failure.  Patient currently is on T collar good saturations are noted right now is on 28% FiO2  Medications: Reviewed on Rounds  Physical Exam:  Vitals: Temperature is 100.3 pulse 98 respiratory rate 20 blood pressure is 150/77 saturations 99%  Ventilator Settings on T collar currently on 28% FiO2  . General: Comfortable at this time . Eyes: Grossly normal lids, irises & conjunctiva . ENT: grossly tongue is normal . Neck: no obvious mass . Cardiovascular: S1 S2 normal no gallop . Respiratory: No rhonchi no rales are noted at this time . Abdomen: soft . Skin: no rash seen on limited exam . Musculoskeletal: not rigid . Psychiatric:unable to assess . Neurologic: no seizure no involuntary movements         Lab Data:   Basic Metabolic Panel: Recent Labs  Lab 02/19/19 0727 02/20/19 0600 02/21/19 0610 02/22/19 0532 02/23/19 0737 02/24/19 1022 02/25/19 0524  NA 143  --  148* 154* 155* 148* 149*  K 3.4*   < > 3.3* 3.7 3.7 3.9 4.4  CL 102  --  109 111 112* 109 107  CO2 30  --  28 31 33* 30 29  GLUCOSE 164*  --  153* 167* 157* 164* 146*  BUN 14  --  26* 32* 37* 41* 41*  CREATININE 0.46*  --  1.00 1.37* 1.37* 1.58* 1.67*  CALCIUM 9.2  --  9.2 9.3 8.9 9.0 9.1  MG 2.0  --  2.5*  --  2.8* 2.8* 2.8*  PHOS 3.5  --  4.8*  --   --  3.6 3.8   < > = values in this interval not displayed.    ABG: No results for input(s): PHART, PCO2ART, PO2ART, HCO3, O2SAT in the last 168 hours.  Liver Function Tests: Recent Labs  Lab 02/24/19 1022 02/25/19 0524  ALBUMIN 1.7* 1.7*   No results for input(s): LIPASE, AMYLASE in the last 168 hours. No  results for input(s): AMMONIA in the last 168 hours.  CBC: Recent Labs  Lab 02/19/19 0727 02/21/19 0610 02/23/19 0737 02/24/19 1022 02/25/19 0524  WBC 8.2 9.7 10.2 11.6* 11.7*  HGB 8.3* 9.1* 8.3* 7.7* 7.3*  HCT 27.4* 30.9* 29.1* 26.3* 25.2*  MCV 92.3 94.5 99.3 95.3 95.5  PLT 388 400 396 375 366    Cardiac Enzymes: No results for input(s): CKTOTAL, CKMB, CKMBINDEX, TROPONINI in the last 168 hours.  BNP (last 3 results) No results for input(s): BNP in the last 8760 hours.  ProBNP (last 3 results) No results for input(s): PROBNP in the last 8760 hours.  Radiological Exams: No results found.  Assessment/Plan Active Problems:   Acute on chronic respiratory failure with hypoxia (HCC)   Lobar pneumonia, unspecified organism (HCC)   Chronic congestive heart failure (HCC)   Closed C7 fracture without spinal cord injury, sequela   Pulmonary embolism and infarction (HCC)   1. Acute on chronic respiratory failure hypoxia plan is to continue T collar trials at his baseline 2. Lobar pneumonia treated clinically improved 3. Chronic congestive heart failure no change 4. C7 fracture at baseline 5. Pulmonary embolism treated we will continue with supportive care  I have personally seen and evaluated the patient, evaluated laboratory and imaging results, formulated the assessment and plan and placed orders. The Patient requires high complexity decision making with multiple systems involvement.  Rounds were done with the Respiratory Therapy Director and Staff therapists and discussed with nursing staff also.  Allyne Gee, MD [facility] Pulmonary Critical Care Medicine Sleep Medicine

## 2019-02-26 DIAGNOSIS — J9621 Acute and chronic respiratory failure with hypoxia: Secondary | ICD-10-CM | POA: Diagnosis not present

## 2019-02-26 DIAGNOSIS — I509 Heart failure, unspecified: Secondary | ICD-10-CM | POA: Diagnosis not present

## 2019-02-26 DIAGNOSIS — J181 Lobar pneumonia, unspecified organism: Secondary | ICD-10-CM | POA: Diagnosis not present

## 2019-02-26 DIAGNOSIS — S12690S Other displaced fracture of seventh cervical vertebra, sequela: Secondary | ICD-10-CM | POA: Diagnosis not present

## 2019-02-26 LAB — RENAL FUNCTION PANEL
Albumin: 1.8 g/dL — ABNORMAL LOW (ref 3.5–5.0)
Anion gap: 10 (ref 5–15)
BUN: 43 mg/dL — ABNORMAL HIGH (ref 6–20)
CO2: 29 mmol/L (ref 22–32)
Calcium: 9.1 mg/dL (ref 8.9–10.3)
Chloride: 105 mmol/L (ref 98–111)
Creatinine, Ser: 1.53 mg/dL — ABNORMAL HIGH (ref 0.61–1.24)
GFR calc Af Amer: >60 mL/min (ref >60)
GFR calc non Af Amer: 52 mL/min — ABNORMAL LOW (ref >60)
Glucose, Bld: 183 mg/dL — ABNORMAL HIGH (ref 70–99)
Phosphorus: 4 mg/dL (ref 2.5–4.6)
Potassium: 4.1 mmol/L (ref 3.5–5.1)
Sodium: 144 mmol/L (ref 135–145)

## 2019-02-26 LAB — CBC
HCT: 25.1 % — ABNORMAL LOW (ref 39.0–52.0)
Hemoglobin: 7.2 g/dL — ABNORMAL LOW (ref 13.0–17.0)
MCH: 27.4 pg (ref 26.0–34.0)
MCHC: 28.7 g/dL — ABNORMAL LOW (ref 30.0–36.0)
MCV: 95.4 fL (ref 80.0–100.0)
Platelets: 326 10*3/uL (ref 150–400)
RBC: 2.63 MIL/uL — ABNORMAL LOW (ref 4.22–5.81)
RDW: 16.7 % — ABNORMAL HIGH (ref 11.5–15.5)
WBC: 11 10*3/uL — ABNORMAL HIGH (ref 4.0–10.5)
nRBC: 0 % (ref 0.0–0.2)

## 2019-02-26 LAB — VANCOMYCIN, TROUGH: Vancomycin Tr: 38 ug/mL (ref 15–20)

## 2019-02-26 NOTE — Progress Notes (Signed)
Pulmonary Critical Care Medicine [facility] [place]   PULMONARY CRITICAL CARE SERVICE  PROGRESS NOTE  Date of Service: 02/26/2019  SEABRON IANNELLO  ZOX:[id number]  DOB: [birth year]   DOA: 12/23/2018  Referring Physician: [name], MD  HPI: Raymond Avila is a 54 y.o. male seen for follow up of Acute on Chronic Respiratory Failure.  Patient is currently on full support on T collar has been on 28% FiO2 with the PMV in place  Medications: Reviewed on Rounds  Physical Exam:  Vitals: Temperature 97.6 pulse 84 respiratory 14 blood pressure 146/89 saturations 100%  Ventilator Settings off the ventilator on T collar FiO2 28%  . General: Comfortable at this time . Eyes: Grossly normal lids, irises & conjunctiva . ENT: grossly tongue is normal . Neck: no obvious mass . Cardiovascular: S1 S2 normal no gallop . Respiratory: No rhonchi no rales are noted at this time . Abdomen: soft . Skin: no rash seen on limited exam . Musculoskeletal: not rigid . Psychiatric:unable to assess . Neurologic: no seizure no involuntary movements         Lab Data:   Basic Metabolic Panel: Recent Labs  Lab 02/21/19 0610 02/21/19 0610 02/22/19 0532 02/23/19 0737 02/24/19 1022 02/25/19 0524 02/26/19 0339  NA 148*   < > 154* 155* 148* 149* 144  K 3.3*   < > 3.7 3.7 3.9 4.4 4.1  CL 109   < > 111 112* 109 107 105  CO2 28   < > 31 33* 30 29 29   GLUCOSE 153*   < > 167* 157* 164* 146* 183*  BUN 26*   < > 32* 37* 41* 41* 43*  CREATININE 1.00   < > 1.37* 1.37* 1.58* 1.67* 1.53*  CALCIUM 9.2   < > 9.3 8.9 9.0 9.1 9.1  MG 2.5*  --   --  2.8* 2.8* 2.8*  --   PHOS 4.8*  --   --   --  3.6 3.8 4.0   < > = values in this interval not displayed.    ABG: No results for input(s): PHART, PCO2ART, PO2ART, HCO3, O2SAT in the last 168 hours.  Liver Function Tests: Recent Labs  Lab 02/24/19 1022 02/25/19 0524 02/26/19 0339  ALBUMIN 1.7* 1.7* 1.8*   No results for input(s): LIPASE, AMYLASE  in the last 168 hours. No results for input(s): AMMONIA in the last 168 hours.  CBC: Recent Labs  Lab 02/21/19 0610 02/23/19 0737 02/24/19 1022 02/25/19 0524 02/26/19 0339  WBC 9.7 10.2 11.6* 11.7* 11.0*  HGB 9.1* 8.3* 7.7* 7.3* 7.2*  HCT 30.9* 29.1* 26.3* 25.2* 25.1*  MCV 94.5 99.3 95.3 95.5 95.4  PLT 400 396 375 366 326    Cardiac Enzymes: No results for input(s): CKTOTAL, CKMB, CKMBINDEX, TROPONINI in the last 168 hours.  BNP (last 3 results) No results for input(s): BNP in the last 8760 hours.  ProBNP (last 3 results) No results for input(s): PROBNP in the last 8760 hours.  Radiological Exams: No results found.  Assessment/Plan Active Problems:   Acute on chronic respiratory failure with hypoxia (HCC)   Lobar pneumonia, unspecified organism (HCC)   Chronic congestive heart failure (HCC)   Closed C7 fracture without spinal cord injury, sequela   Pulmonary embolism and infarction (HCC)   1. Acute on chronic respiratory failure hypoxia plan is to continue with T collar trials currently on 28% FiO2 with the PMV 2. Lobar pneumonia treated we will continue present management 3. Chronic congestive  heart failure baseline 4. C7 fracture no changes 5. Pulmonary embolism treated supportive care   I have personally seen and evaluated the patient, evaluated laboratory and imaging results, formulated the assessment and plan and placed orders. The Patient requires high complexity decision making with multiple systems involvement.  Rounds were done with the Respiratory Therapy Director and Staff therapists and discussed with nursing staff also.  Allyne Gee, MD [facility] Pulmonary Critical Care Medicine Sleep Medicine

## 2019-02-26 NOTE — Progress Notes (Signed)
PROGRESS NOTE    Raymond Avila  RWE:[id number] DOB: Nov 30, 1965 DOA: 12/23/2018  Brief Narrative:  Raymond Beecham Shearinis an 54 y.o.malewith morbid obesity, schizoaffective disorder, Tourette syndrome, COPD, hypertension, obstructive sleep apnea who had a recent fall with neck trauma which caused C7/T1jumped facets in his neck with anterior listhesis and severe spinal canalnarrowing with spinal cord injury and paraplegia. He was admitted to outside hospital on 11/23/2018. He required anterior cervical disc fusion and despite this developed paraplegia. Postoperatively patient was intubated and had respiratory complications after that. He developed pneumonia secondary to Klebsiella. He had prolonged ventilation and eventually underwent tracheostomy placement. Patient also had DVT in right lower extremity and left upper extremity which led to pulmonary embolism. He was initially on heparin drip and then transitioned to Eliquis. He also had a feeding tube placed. Apparently posterior surgical fusion was postponed secondary to the patient's medical condition and inability to stay prone for the surgery because of the respiratory failure. Due to his medical problems he was transferred to [facility]. After admission here patient has been having fever spikes. All cultures to date negative. Hewas treated withIV vancomycin, meropenem,fluconazole.However, continued to have fevers despite being on antibiotics. Therefore antibiotics were discontinued. Thought to be neuroleptic malignant syndrome? Due to psych medications. His psych medications were adjusted.After thatpatient started having diarrhea. He has stool for C. difficile checked in the past and it was negative. He was treated empirically with p.o. vancomycin. 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 CT of the abdomen and pelvis was done on 02/15/2019 showed dense consolidation of the left lower lobe  likely pneumonia. Left lower lobe bronchus narrowed, debris/mucus. Progression of the sacral decubitus ulcer without evidence of focal abscess or additional soft tissue gas.  02/25/2019: He continued to have fever. Therefore was started on IV vancomycin, Zosyn. Initial vancomycin trough was low therefore the vancomycin dose was increased. However, subsequent vancomycin trough Was found to be high. Creatinine also worsened. T-max 100.3. He also underwent debridement of his sacral pressure ulcer at bedside, no cultures. Vancomycin has been on hold.  02/26/2019: Antibiotics were switched to IV cefepime, Flagyl to cover for the infected sacral pressure ulcer.  Vancomycin and Zosyn discontinued.  Creatinine improving.  Per the primary team and the nursing staff he is having diarrhea again.  Team planning to place Flexi-Seal.   Assessment & Plan:   Active Problems:   Acute on chronic respiratory failure with hypoxia (HCC)   Lobar pneumonia, unspecified organism (HCC)   Chronic congestive heart failure (HCC)   Closed C7 fracture without spinal cord injury, sequela   Pulmonary embolism and infarction (HCC) Sacrococcygeal pressure ulcerunstageable Leukocytosis Acute renal insufficiency Diarrhea Right lower extremity/left upper extremity DVT Fevers COPD Obstructive sleep apnea Morbid obesity Protein calorie malnutrition Dysphagia History of schizoaffective disorder   Acute on chronic hypoxemic respiratory failure:Previouschest x-rayshowedmild right basilar atelectasis or infiltrate. Healready received treatment withIV vancomycin, meropenem, fluconazole. PreviousTracheal aspirate cultures showed rare gram-positive rods, normal respiratory flora.Pulmonary following. Currently on IV cefepime, Flagyl. Respiratory status appears to be stable at this time. Pulmonary following.  However, due to his chronic trach, he is at high risk for recurrent pneumonia, worsening respiratory failure,  recurrent tracheobronchitis.  Pneumonia:Already treated with IV vancomycin, meropenem, fluconazole. Then restarted with IV Zosyn, fluconazole due to his worsening sacral pressure ulcer. IV vancomycin was added due to on and off fevers.  Initial vancomycin trough was low but subsequent Vanco troughs have been high. Therefore vancomycin has been  on hold. Pulmonary following.He is stable at this time.However, he is high risk for recurrent pneumonia, worsening respiratory failure. If his respiratory statusworsens,would recommend CT imaging for better evaluation.   Fever: Patientwashaving on and off fevers since his admission here despite being on antibiotics. Previous respiratory cultures showed normal flora.Therewas also concern for aspiration. Initiallyfevers were thought to be either secondary to DVT/PE versus neurolept malignant syndrome. His psychiatric medications were adjusted.He was also treated with antimicrobials and then with p.o. vancomycinfor diarrhea. -Due to low-grade fevers and concern for infected sacral ulcer he was restarted on  fluconazole, Zosyn. IV vancomycin was also added. Initial Vanc trough was low therefore the vancomycin dose was increased. However, subsequent trough came back elevated, creatinine also trending up. Therefore vancomycin on hold.  02/26/19: Discontinued the Zosyn and fluconazole and switched to IV cefepime and Flagyl. He continues to have on and off low-grade fevers likely secondary to the sacral pressure ulcer which was debrided at the bedside per the primary team. Continue to monitor. He is at high risk for recurrent and worsening fevers.  Sacrococcygeal pressure ulcer, unstageable: Continue local wound care.Currently vancomycin discontinued.  Antibiotics switched to IV cefepime, Flagyl.  He had bedside debridement. Has wound VAC in place. Continue to monitor.   Diarrhea: He has been tested for C. difficile in the past and has been negative.  Diarrhea could be secondary to tube feeds?  He was treated with p.o. vancomycin.  Subsequently treated with a trial of cholestyramine.  Per the primary team his diarrhea improved with the cholestyramine.  However, today he started having worsening diarrhea again.  Will attempt treatment with cholestyramine again since he seems to have responded well to the cholestyramine previously.  If not improving in the next 48 hours consider adding p.o. vancomycin to his regimen.  Leukocytosis: Patient had elevated WBC count 21.7 at one-point. CT imaging findings as mentioned above. Concern for infected sacrococcygeal pressure ulcer. Already treated with antibiotics in the past. He wasRestarted on Zosyn, fluconazole. Due to ongoing fevers and vancomycin was added but initially had a low trough due to which reason dose had to be increased. However, subsequent vancomycin trough came back high along with elevated creatinine.  Vancomycin discontinued and switched to IV cefepime and Flagyl due to ongoing fevers.  We will plan to treat for a tentative duration of 1 week pending improvement.  Continue to monitor.  However, he is at high risk for recurrent fevers despite being on antibiotics due to his infected sacral pressure ulcer and also concern for aspiration.  Acute renal insufficiency: IV vancomycin was added to his antibiotic regimen due to ongoing fevers. Initial vancomycin trough was low. Therefore vancomycin dose was increased. However, subsequent vancomycin trough was elevated along with elevated creatinine. Currently vancomycin discontinued.  Recommend to continue IV hydration and albumin to prevent volume overload/edema. Continue to monitor BUN/creatinine and intake and output closely. Switched to IV cefepime and Flagyl.  Right lower extremity DVT/left upper extremity DVT/pulmonary embolism: He was on IV heparin at the outside facility now transitioned to Eliquis. Further management per the primary  team.  C7 fracture status post ACDF surgery: Surgical site stable. Unfortunately has paraplegia with weakness. Continue supportive management per the primary team.  COPD: Continue medication and management per primary team and pulmonary.  Morbid obesity/obstructive sleep apnea: Patient admits to not using the CPAP. Continue management per primary team and pulmonary.  Protein calorie malnutrition: Continue management per primary team.  Dysphagia: Unfortunately due to his dysphagia he  is high risk for aspiration and worsening respiratory failure secondary to aspiration pneumonia.  Schizoaffective disorder: Medicationsand management per the primary team.  Plan of care discussed with the primary team.  Due to his complex medical problems he is high risk for worsening and decompensation.   Subjective: He is currently on 28% FiO2.  Per the primary team and the nursing staff he is now having diarrhea again.  Objective: Vitals: Temperature 98.1, pulse 84, blood pressure 142/86, respiratory rate 14, oxygen saturation 100%  Examination: General exam:Morbidly obese male, awake, not in any acute distress HEENT:Atraumatic, normocephalic, pupils equal and reactive, ear or nose lesions. Neck:Trach, has c-collar in place Respiratory system:Occasional rhonchi, no wheezing anteriorly Cardiovascular system:S1 &S2 heard, no murmur. Gastrointestinal system:Obese male,soft and nontender. Normal bowel sounds heard. Central nervous system:Weakness in all extremities Extremities:mild edema lower extremity Skin: No rashes,sacrococcygeal pressure ulcerunstageable, wound VAC Psychiatry:Mood &affect appropriate.      Data Reviewed: I have personally reviewed following labs and imaging studies  CBC: Recent Labs  Lab 02/21/19 0610 02/23/19 0737 02/24/19 1022 02/25/19 0524 02/26/19 0339  WBC 9.7 10.2 11.6* 11.7* 11.0*  HGB 9.1* 8.3* 7.7* 7.3* 7.2*  HCT 30.9* 29.1* 26.3*  25.2* 25.1*  MCV 94.5 99.3 95.3 95.5 95.4  PLT 400 396 375 366 400   Basic Metabolic Panel: Recent Labs  Lab 02/21/19 0610 02/21/19 0610 02/22/19 0532 02/23/19 0737 02/24/19 1022 02/25/19 0524 02/26/19 0339  NA 148*   < > 154* 155* 148* 149* 144  K 3.3*   < > 3.7 3.7 3.9 4.4 4.1  CL 109   < > 111 112* 109 107 105  CO2 28   < > 31 33* _0 GLUCOSE 153*   < > 167* 157* 164* 146* 183*  BUN 26*   < > 32* 37* 41* 41* 43*  CREATININE 1.00   < > 1.37* 1.37* 1.58* 1.67* 1.53*  CALCIUM 9.2   < > 9.3 8.9 9.0 9.1 9.1  MG 2.5*  --   --  2.8* 2.8* 2.8*  --   PHOS 4.8*  --   --   --  3.6 3.8 4.0   < > = values in this interval not displayed.   GFR: CrCl cannot be calculated (Unknown ideal weight.). Liver Function Tests: Recent Labs  Lab 02/24/19 1022 02/25/19 0524 02/26/19 0339  ALBUMIN 1.7* 1.7* 1.8*   No results for input(s): LIPASE, AMYLASE in the last 168 hours. No results for input(s): AMMONIA in the last 168 hours. Coagulation Profile: No results for input(s): INR, PROTIME in the last 168 hours. Cardiac Enzymes: No results for input(s): CKTOTAL, CKMB, CKMBINDEX, TROPONINI in the last 168 hours. BNP (last 3 results) No results for input(s): PROBNP in the last 8760 hours. HbA1C: No results for input(s): HGBA1C in the last 72 hours. CBG: No results for input(s): GLUCAP in the last 168 hours. Lipid Profile: No results for input(s): CHOL, HDL, LDLCALC, TRIG, CHOLHDL, LDLDIRECT in the last 72 hours. Thyroid Function Tests: No results for input(s): TSH, T4TOTAL, FREET4, T3FREE, THYROIDAB in the last 72 hours. Anemia Panel: No results for input(s): VITAMINB12, FOLATE, FERRITIN, TIBC, IRON, RETICCTPCT in the last 72 hours. Sepsis Labs: No results for input(s): PROCALCITON, LATICACIDVEN in the last 168 hours.  Recent Results (from the past 240 hour(s))  Acid Fast Smear (AFB)     Status: None   Collection Time: 02/17/19  9:54 AM   Specimen: Bronchoalveolar Lavage;  Respiratory  Result Value Ref Range  Status   AFB Specimen Processing Concentration  Final   Acid Fast Smear Negative  Final    Comment: (NOTE) Performed At: [facility] [place], [place] [id number] Rush Farmer MD TD:[id number]    Source (AFB) BRONCHIAL ALVEOLAR LAVAGE  Final    Comment: Performed at [facility], [place] [address], [place], [place] [address]  Fungus Culture With Stain     Status: None (Preliminary result)   Collection Time: 02/17/19  9:54 AM  Result Value Ref Range Status   Fungus Stain Final report  Final    Comment: (NOTE) Performed At: [facility] [place], [place] [id number] Rush Farmer MD OI:[id number]    Fungus (Mycology) Culture PENDING  Incomplete   Fungal Source BRONCHIAL ALVEOLAR LAVAGE  Final    Comment: Performed at [facility], [place] [address], [place], [place] [address]  Fungus Culture Result     Status: None   Collection Time: 02/17/19  9:54 AM  Result Value Ref Range Status   Result 1 Comment  Final    Comment: (NOTE) KOH/Calcofluor preparation:  no fungus observed. Performed At: [facility] [place], [place] [id number] Rush Farmer MD JZ:[id number]   Culture, respiratory (non-expectorated)     Status: None   Collection Time: 02/17/19 10:00 AM   Specimen: Bronchoalveolar Lavage; Respiratory  Result Value Ref Range Status   Specimen Description BRONCHIAL ALVEOLAR LAVAGE  Final   Special Requests NONE  Final   Gram Stain   Final    FEW WBC PRESENT, PREDOMINANTLY PMN NO ORGANISMS SEEN    Culture RARE STAPHYLOCOCCUS AUREUS  Final   Report Status 02/19/2019 FINAL  Final   Organism ID, Bacteria STAPHYLOCOCCUS AUREUS  Final      Susceptibility   Staphylococcus aureus - MIC*    CIPROFLOXACIN <=0.5 SENSITIVE Sensitive     ERYTHROMYCIN RESISTANT Resistant     GENTAMICIN <=0.5 SENSITIVE Sensitive     OXACILLIN 0.5 SENSITIVE Sensitive     TETRACYCLINE <=1  SENSITIVE Sensitive     VANCOMYCIN <=0.5 SENSITIVE Sensitive     TRIMETH/SULFA <=10 SENSITIVE Sensitive     CLINDAMYCIN RESISTANT Resistant     RIFAMPIN <=0.5 SENSITIVE Sensitive     Inducible Clindamycin POSITIVE Resistant     * RARE STAPHYLOCOCCUS AUREUS         Radiology Studies: No results found.    Scheduled Meds: Please see MAR   Yaakov Guthrie, MD 02/26/2019, 12:34 PM

## 2019-02-27 DIAGNOSIS — S12690S Other displaced fracture of seventh cervical vertebra, sequela: Secondary | ICD-10-CM | POA: Diagnosis not present

## 2019-02-27 DIAGNOSIS — J181 Lobar pneumonia, unspecified organism: Secondary | ICD-10-CM | POA: Diagnosis not present

## 2019-02-27 DIAGNOSIS — J9621 Acute and chronic respiratory failure with hypoxia: Secondary | ICD-10-CM | POA: Diagnosis not present

## 2019-02-27 DIAGNOSIS — I509 Heart failure, unspecified: Secondary | ICD-10-CM | POA: Diagnosis not present

## 2019-02-27 LAB — BASIC METABOLIC PANEL
Anion gap: 9 (ref 5–15)
BUN: 40 mg/dL — ABNORMAL HIGH (ref 6–20)
CO2: 27 mmol/L (ref 22–32)
Calcium: 9.3 mg/dL (ref 8.9–10.3)
Chloride: 109 mmol/L (ref 98–111)
Creatinine, Ser: 1.49 mg/dL — ABNORMAL HIGH (ref 0.61–1.24)
GFR calc Af Amer: >60 mL/min (ref >60)
GFR calc non Af Amer: 54 mL/min — ABNORMAL LOW (ref >60)
Glucose, Bld: 159 mg/dL — ABNORMAL HIGH (ref 70–99)
Potassium: 4.2 mmol/L (ref 3.5–5.1)
Sodium: 145 mmol/L (ref 135–145)

## 2019-02-27 NOTE — Progress Notes (Addendum)
Pulmonary Critical Care Medicine [facility] [place]   PULMONARY CRITICAL CARE SERVICE  PROGRESS NOTE  Date of Service: 02/27/2019  Raymond Avila  ION:[id number]  DOB: [birth year]   DOA: 12/23/2018  Referring Physician: [name], MD  HPI: Raymond Avila is a 54 y.o. male seen for follow up of Acute on Chronic Respiratory Failure.  Patient remains on aerosol trach collar at this time using PMV with no difficulty satting well with no distress.  Medications: Reviewed on Rounds  Physical Exam:  Vitals: Pulse 82 respirations 16 BP 138/86 O2 sat 99% temp 98.0  Ventilator Settings 20% aerosol trach collar  . General: Comfortable at this time . Eyes: Grossly normal lids, irises & conjunctiva . ENT: grossly tongue is normal . Neck: no obvious mass . Cardiovascular: S1 S2 normal no gallop . Respiratory: No rales or rhonchi noted . Abdomen: soft . Skin: no rash seen on limited exam . Musculoskeletal: not rigid . Psychiatric:unable to assess . Neurologic: no seizure no involuntary movements         Lab Data:   Basic Metabolic Panel: Recent Labs  Lab 02/21/19 0610 02/22/19 0532 02/23/19 0737 02/24/19 1022 02/25/19 0524 02/26/19 0339 02/27/19 0626  NA 148*   < > 155* 148* 149* 144 145  K 3.3*   < > 3.7 3.9 4.4 4.1 4.2  CL 109   < > 112* 109 107 105 109  CO2 28   < > 33* 30 29 29 27   GLUCOSE 153*   < > 157* 164* 146* 183* 159*  BUN 26*   < > 37* 41* 41* 43* 40*  CREATININE 1.00   < > 1.37* 1.58* 1.67* 1.53* 1.49*  CALCIUM 9.2   < > 8.9 9.0 9.1 9.1 9.3  MG 2.5*  --  2.8* 2.8* 2.8*  --   --   PHOS 4.8*  --   --  3.6 3.8 4.0  --    < > = values in this interval not displayed.    ABG: No results for input(s): PHART, PCO2ART, PO2ART, HCO3, O2SAT in the last 168 hours.  Liver Function Tests: Recent Labs  Lab 02/24/19 1022 02/25/19 0524 02/26/19 0339  ALBUMIN 1.7* 1.7* 1.8*   No results for input(s): LIPASE, AMYLASE in the last 168 hours. No results  for input(s): AMMONIA in the last 168 hours.  CBC: Recent Labs  Lab 02/21/19 0610 02/23/19 0737 02/24/19 1022 02/25/19 0524 02/26/19 0339  WBC 9.7 10.2 11.6* 11.7* 11.0*  HGB 9.1* 8.3* 7.7* 7.3* 7.2*  HCT 30.9* 29.1* 26.3* 25.2* 25.1*  MCV 94.5 99.3 95.3 95.5 95.4  PLT 400 396 375 366 326    Cardiac Enzymes: No results for input(s): CKTOTAL, CKMB, CKMBINDEX, TROPONINI in the last 168 hours.  BNP (last 3 results) No results for input(s): BNP in the last 8760 hours.  ProBNP (last 3 results) No results for input(s): PROBNP in the last 8760 hours.  Radiological Exams: No results found.  Assessment/Plan Active Problems:   Acute on chronic respiratory failure with hypoxia (HCC)   Lobar pneumonia, unspecified organism (HCC)   Chronic congestive heart failure (HCC)   Closed C7 fracture without spinal cord injury, sequela   Pulmonary embolism and infarction (HCC)   1. Acute on chronic respiratory failure hypoxia plan is to continue with T collar trials currently on 28% FiO2 with the PMV 2. Lobar pneumonia treated we will continue present management 3. Chronic congestive heart failure baseline 4. C7 fracture no changes  5. Pulmonary embolism treated supportive care   I have personally seen and evaluated the patient, evaluated laboratory and imaging results, formulated the assessment and plan and placed orders. The Patient requires high complexity decision making with multiple systems involvement.  Rounds were done with the Respiratory Therapy Director and Staff therapists and discussed with nursing staff also.  Allyne Gee, MD [facility] Pulmonary Critical Care Medicine Sleep Medicine

## 2019-02-28 DIAGNOSIS — J181 Lobar pneumonia, unspecified organism: Secondary | ICD-10-CM | POA: Diagnosis not present

## 2019-02-28 DIAGNOSIS — I509 Heart failure, unspecified: Secondary | ICD-10-CM | POA: Diagnosis not present

## 2019-02-28 DIAGNOSIS — S12690S Other displaced fracture of seventh cervical vertebra, sequela: Secondary | ICD-10-CM | POA: Diagnosis not present

## 2019-02-28 DIAGNOSIS — J9621 Acute and chronic respiratory failure with hypoxia: Secondary | ICD-10-CM | POA: Diagnosis not present

## 2019-02-28 NOTE — Progress Notes (Signed)
Pulmonary Critical Care Medicine [facility] [place]   PULMONARY CRITICAL CARE SERVICE  PROGRESS NOTE  Date of Service: 02/28/2019  Raymond Avila  DVV:[id number]  DOB: 05/17/1965   DOA: 12/23/2018  Referring Physician: [name], MD  HPI: Raymond Avila is a 54 y.o. male seen for follow up of Acute on Chronic Respiratory Failure.  Patient is on T collar right now on 28% FiO2 at his baseline  Medications: Reviewed on Rounds  Physical Exam:  Vitals: Temperature 97.4 pulse 83 respiratory 18 blood pressure 116/59 saturations 96%  Ventilator Settings off the ventilator on T collar with an FiO2 of 28%  . General: Comfortable at this time . Eyes: Grossly normal lids, irises & conjunctiva . ENT: grossly tongue is normal . Neck: no obvious mass . Cardiovascular: S1 S2 normal no gallop . Respiratory: No rhonchi no rales are noted at this time . Abdomen: soft . Skin: no rash seen on limited exam . Musculoskeletal: not rigid . Psychiatric:unable to assess . Neurologic: no seizure no involuntary movements         Lab Data:   Basic Metabolic Panel: Recent Labs  Lab 02/23/19 0737 02/24/19 1022 02/25/19 0524 02/26/19 0339 02/27/19 0626  NA 155* 148* 149* 144 145  K 3.7 3.9 4.4 4.1 4.2  CL 112* 109 107 105 109  CO2 33* 30 29 29 27   GLUCOSE 157* 164* 146* 183* 159*  BUN 37* 41* 41* 43* 40*  CREATININE 1.37* 1.58* 1.67* 1.53* 1.49*  CALCIUM 8.9 9.0 9.1 9.1 9.3  MG 2.8* 2.8* 2.8*  --   --   PHOS  --  3.6 3.8 4.0  --     ABG: No results for input(s): PHART, PCO2ART, PO2ART, HCO3, O2SAT in the last 168 hours.  Liver Function Tests: Recent Labs  Lab 02/24/19 1022 02/25/19 0524 02/26/19 0339  ALBUMIN 1.7* 1.7* 1.8*   No results for input(s): LIPASE, AMYLASE in the last 168 hours. No results for input(s): AMMONIA in the last 168 hours.  CBC: Recent Labs  Lab 02/23/19 0737 02/24/19 1022 02/25/19 0524 02/26/19 0339  WBC 10.2 11.6* 11.7* 11.0*  HGB  8.3* 7.7* 7.3* 7.2*  HCT 29.1* 26.3* 25.2* 25.1*  MCV 99.3 95.3 95.5 95.4  PLT 396 375 366 326    Cardiac Enzymes: No results for input(s): CKTOTAL, CKMB, CKMBINDEX, TROPONINI in the last 168 hours.  BNP (last 3 results) No results for input(s): BNP in the last 8760 hours.  ProBNP (last 3 results) No results for input(s): PROBNP in the last 8760 hours.  Radiological Exams: No results found.  Assessment/Plan Active Problems:   Acute on chronic respiratory failure with hypoxia (HCC)   Lobar pneumonia, unspecified organism (HCC)   Chronic congestive heart failure (HCC)   Closed C7 fracture without spinal cord injury, sequela   Pulmonary embolism and infarction (HCC)   1. Acute on chronic respiratory failure with hypoxia plan is to continue with T collar trials continue on FiO2 28% 2. Lobar pneumonia treated we will continue to follow 3. Chronic congestive heart failure at baseline 4. C7 fracture ago changes status post ACDF 5. Pulmonary embolism treated   I have personally seen and evaluated the patient, evaluated laboratory and imaging results, formulated the assessment and plan and placed orders. The Patient requires high complexity decision making with multiple systems involvement.  Rounds were done with the Respiratory Therapy Director and Staff therapists and discussed with nursing staff also.  02/28/19, MD [facility] Pulmonary  Critical Care Medicine Sleep Medicine

## 2019-03-01 ENCOUNTER — Other Ambulatory Visit (HOSPITAL_COMMUNITY): Payer: Medicare Other

## 2019-03-01 DIAGNOSIS — S12690S Other displaced fracture of seventh cervical vertebra, sequela: Secondary | ICD-10-CM | POA: Diagnosis not present

## 2019-03-01 DIAGNOSIS — J9621 Acute and chronic respiratory failure with hypoxia: Secondary | ICD-10-CM | POA: Diagnosis not present

## 2019-03-01 DIAGNOSIS — I509 Heart failure, unspecified: Secondary | ICD-10-CM | POA: Diagnosis not present

## 2019-03-01 DIAGNOSIS — J181 Lobar pneumonia, unspecified organism: Secondary | ICD-10-CM | POA: Diagnosis not present

## 2019-03-01 LAB — OCCULT BLOOD X 1 CARD TO LAB, STOOL: Fecal Occult Bld: NEGATIVE

## 2019-03-01 LAB — CBC
HCT: 24.4 % — ABNORMAL LOW (ref 39.0–52.0)
Hemoglobin: 7.1 g/dL — ABNORMAL LOW (ref 13.0–17.0)
MCH: 27.6 pg (ref 26.0–34.0)
MCHC: 29.1 g/dL — ABNORMAL LOW (ref 30.0–36.0)
MCV: 94.9 fL (ref 80.0–100.0)
Platelets: 318 10*3/uL (ref 150–400)
RBC: 2.57 MIL/uL — ABNORMAL LOW (ref 4.22–5.81)
RDW: 16.5 % — ABNORMAL HIGH (ref 11.5–15.5)
WBC: 8.5 10*3/uL (ref 4.0–10.5)
nRBC: 0 % (ref 0.0–0.2)

## 2019-03-01 LAB — BASIC METABOLIC PANEL
Anion gap: 10 (ref 5–15)
BUN: 41 mg/dL — ABNORMAL HIGH (ref 6–20)
CO2: 26 mmol/L (ref 22–32)
Calcium: 9.6 mg/dL (ref 8.9–10.3)
Chloride: 108 mmol/L (ref 98–111)
Creatinine, Ser: 1.21 mg/dL (ref 0.61–1.24)
GFR calc Af Amer: >60 mL/min (ref >60)
GFR calc non Af Amer: >60 mL/min (ref >60)
Glucose, Bld: 143 mg/dL — ABNORMAL HIGH (ref 70–99)
Potassium: 4.7 mmol/L (ref 3.5–5.1)
Sodium: 144 mmol/L (ref 135–145)

## 2019-03-01 LAB — MAGNESIUM: Magnesium: 2.8 mg/dL — ABNORMAL HIGH (ref 1.7–2.4)

## 2019-03-01 NOTE — Progress Notes (Signed)
Pulmonary Critical Care Medicine [place]   PULMONARY CRITICAL CARE SERVICE  PROGRESS NOTE  Date of Service: 03/01/2019  Raymond Avila  YNW:[id number]  DOB: 07-Jun-1965   DOA: 12/23/2018  Referring Physician: Merton Border, MD  HPI: Raymond Avila is a 54 y.o. male seen for follow up of Acute on Chronic Respiratory Failure.  Patient is on T collar now on 28% FiO2 good saturations  Medications: Reviewed on Rounds  Physical Exam:  Vitals: Temperature 98.6 pulse 88 respiratory rate 20 blood pressure is 122/75 saturations 99%  Ventilator Settings off the ventilator on T collar  . General: Comfortable at this time . Eyes: Grossly normal lids, irises & conjunctiva . ENT: grossly tongue is normal . Neck: no obvious mass . Cardiovascular: S1 S2 normal no gallop . Respiratory: No rhonchi no rales are noted . Abdomen: soft . Skin: no rash seen on limited exam . Musculoskeletal: not rigid . Psychiatric:unable to assess . Neurologic: no seizure no involuntary movements         Lab Data:   Basic Metabolic Panel: Recent Labs  Lab 02/23/19 0737 02/23/19 0737 02/24/19 1022 02/25/19 0524 02/26/19 0339 02/27/19 0626 03/01/19 0554  NA 155*   < > 148* 149* 144 145 144  K 3.7   < > 3.9 4.4 4.1 4.2 4.7  CL 112*   < > 109 107 105 109 108  CO2 33*   < > 30 29 29 27 26   GLUCOSE 157*   < > 164* 146* 183* 159* 143*  BUN 37*   < > 41* 41* 43* 40* 41*  CREATININE 1.37*   < > 1.58* 1.67* 1.53* 1.49* 1.21  CALCIUM 8.9   < > 9.0 9.1 9.1 9.3 9.6  MG 2.8*  --  2.8* 2.8*  --   --  2.8*  PHOS  --   --  3.6 3.8 4.0  --   --    < > = values in this interval not displayed.    ABG: No results for input(s): PHART, PCO2ART, PO2ART, HCO3, O2SAT in the last 168 hours.  Liver Function Tests: Recent Labs  Lab 02/24/19 1022 02/25/19 0524 02/26/19 0339  ALBUMIN 1.7* 1.7* 1.8*   No results for input(s): LIPASE, AMYLASE in the last 168 hours. No results for input(s):  AMMONIA in the last 168 hours.  CBC: Recent Labs  Lab 02/23/19 0737 02/24/19 1022 02/25/19 0524 02/26/19 0339 03/01/19 0554  WBC 10.2 11.6* 11.7* 11.0* 8.5  HGB 8.3* 7.7* 7.3* 7.2* 7.1*  HCT 29.1* 26.3* 25.2* 25.1* 24.4*  MCV 99.3 95.3 95.5 95.4 94.9  PLT 396 375 366 326 318    Cardiac Enzymes: No results for input(s): CKTOTAL, CKMB, CKMBINDEX, TROPONINI in the last 168 hours.  BNP (last 3 results) No results for input(s): BNP in the last 8760 hours.  ProBNP (last 3 results) No results for input(s): PROBNP in the last 8760 hours.  Radiological Exams: No results found.  Assessment/Plan Active Problems:   Acute on chronic respiratory failure with hypoxia (HCC)   Lobar pneumonia, unspecified organism (HCC)   Chronic congestive heart failure (HCC)   Closed C7 fracture without spinal cord injury, sequela   Pulmonary embolism and infarction ([place])   1. Acute on chronic respiratory failure hypoxia plan is to continue with T collar trials right now is on 28% FiO2 titrate as tolerated 2. Lobar pneumonia treated clinically is improving 3. Chronic congestive heart failure at baseline 4. Close C7 fracture no  changes 5. Pulmonary embolism treated we will continue with supportive care   I have personally seen and evaluated the patient, evaluated laboratory and imaging results, formulated the assessment and plan and placed orders. The Patient requires high complexity decision making with multiple systems involvement.  Rounds were done with the Respiratory Therapy Director and Staff therapists and discussed with nursing staff also.  Yevonne Pax, MD [facility] Pulmonary Critical Care Medicine Sleep Medicine

## 2019-03-02 DIAGNOSIS — I509 Heart failure, unspecified: Secondary | ICD-10-CM | POA: Diagnosis not present

## 2019-03-02 DIAGNOSIS — J9621 Acute and chronic respiratory failure with hypoxia: Secondary | ICD-10-CM | POA: Diagnosis not present

## 2019-03-02 DIAGNOSIS — J181 Lobar pneumonia, unspecified organism: Secondary | ICD-10-CM | POA: Diagnosis not present

## 2019-03-02 DIAGNOSIS — S12690S Other displaced fracture of seventh cervical vertebra, sequela: Secondary | ICD-10-CM | POA: Diagnosis not present

## 2019-03-02 LAB — CBC
HCT: 23.2 % — ABNORMAL LOW (ref 39.0–52.0)
Hemoglobin: 6.8 g/dL — CL (ref 13.0–17.0)
MCH: 27.8 pg (ref 26.0–34.0)
MCHC: 29.3 g/dL — ABNORMAL LOW (ref 30.0–36.0)
MCV: 94.7 fL (ref 80.0–100.0)
Platelets: 312 10*3/uL (ref 150–400)
RBC: 2.45 MIL/uL — ABNORMAL LOW (ref 4.22–5.81)
RDW: 16.8 % — ABNORMAL HIGH (ref 11.5–15.5)
WBC: 8.8 10*3/uL (ref 4.0–10.5)
nRBC: 0 % (ref 0.0–0.2)

## 2019-03-02 LAB — MAGNESIUM: Magnesium: 2.6 mg/dL — ABNORMAL HIGH (ref 1.7–2.4)

## 2019-03-02 LAB — RENAL FUNCTION PANEL
Albumin: 2.4 g/dL — ABNORMAL LOW (ref 3.5–5.0)
Anion gap: 10 (ref 5–15)
BUN: 45 mg/dL — ABNORMAL HIGH (ref 6–20)
CO2: 25 mmol/L (ref 22–32)
Calcium: 9.7 mg/dL (ref 8.9–10.3)
Chloride: 108 mmol/L (ref 98–111)
Creatinine, Ser: 1.13 mg/dL (ref 0.61–1.24)
GFR calc Af Amer: >60 mL/min (ref >60)
GFR calc non Af Amer: >60 mL/min (ref >60)
Glucose, Bld: 147 mg/dL — ABNORMAL HIGH (ref 70–99)
Phosphorus: 3.2 mg/dL (ref 2.5–4.6)
Potassium: 4.6 mmol/L (ref 3.5–5.1)
Sodium: 143 mmol/L (ref 135–145)

## 2019-03-02 LAB — ABO/RH: ABO/RH(D): O POS

## 2019-03-02 LAB — PREPARE RBC (CROSSMATCH)

## 2019-03-02 NOTE — Progress Notes (Signed)
Pulmonary Critical Care Medicine [place]   PULMONARY CRITICAL CARE SERVICE  PROGRESS NOTE  Date of Service: 03/02/2019  Raymond Avila  UVO:[id number]  DOB: 10/10/1965   DOA: 12/23/2018  Referring Physician: Merton Border, MD  HPI: Raymond Avila is a 54 y.o. male seen for follow up of Acute on Chronic Respiratory Failure. Patient right now is on T collar and 28% FiO2 using the PMV  Medications: Reviewed on Rounds  Physical Exam:  Vitals: Temperature 98.7 pulse 82 respiratory rate 32 blood pressure is 118/63 saturations 98%  Ventilator Settings off ventilator on T collar FiO2 20%  . General: Comfortable at this time . Eyes: Grossly normal lids, irises & conjunctiva . ENT: grossly tongue is normal . Neck: no obvious mass . Cardiovascular: S1 S2 normal no gallop . Respiratory: No rhonchi no rales are noted at this time . Abdomen: soft . Skin: no rash seen on limited exam . Musculoskeletal: not rigid . Psychiatric:unable to assess . Neurologic: no seizure no involuntary movements         Lab Data:   Basic Metabolic Panel: Recent Labs  Lab 02/24/19 1022 02/24/19 1022 02/25/19 0524 02/26/19 0339 02/27/19 0626 03/01/19 0554 03/02/19 0635  NA 148*   < > 149* 144 145 144 143  K 3.9   < > 4.4 4.1 4.2 4.7 4.6  CL 109   < > 107 105 109 108 108  CO2 30   < > 29 29 27 26 25   GLUCOSE 164*   < > 146* 183* 159* 143* 147*  BUN 41*   < > 41* 43* 40* 41* 45*  CREATININE 1.58*   < > 1.67* 1.53* 1.49* 1.21 1.13  CALCIUM 9.0   < > 9.1 9.1 9.3 9.6 9.7  MG 2.8*  --  2.8*  --   --  2.8* 2.6*  PHOS 3.6  --  3.8 4.0  --   --  3.2   < > = values in this interval not displayed.    ABG: No results for input(s): PHART, PCO2ART, PO2ART, HCO3, O2SAT in the last 168 hours.  Liver Function Tests: Recent Labs  Lab 02/24/19 1022 02/25/19 0524 02/26/19 0339 03/02/19 0635  ALBUMIN 1.7* 1.7* 1.8* 2.4*   No results for input(s): LIPASE, AMYLASE in the last 168  hours. No results for input(s): AMMONIA in the last 168 hours.  CBC: Recent Labs  Lab 02/24/19 1022 02/25/19 0524 02/26/19 0339 03/01/19 0554 03/02/19 0635  WBC 11.6* 11.7* 11.0* 8.5 8.8  HGB 7.7* 7.3* 7.2* 7.1* 6.8*  HCT 26.3* 25.2* 25.1* 24.4* 23.2*  MCV 95.3 95.5 95.4 94.9 94.7  PLT 375 366 326 318 312    Cardiac Enzymes: No results for input(s): CKTOTAL, CKMB, CKMBINDEX, TROPONINI in the last 168 hours.  BNP (last 3 results) No results for input(s): BNP in the last 8760 hours.  ProBNP (last 3 results) No results for input(s): PROBNP in the last 8760 hours.  Radiological Exams: No results found.  Assessment/Plan Active Problems:   Acute on chronic respiratory failure with hypoxia (HCC)   Lobar pneumonia, unspecified organism (HCC)   Chronic congestive heart failure (HCC)   Closed C7 fracture without spinal cord injury, sequela   Pulmonary embolism and infarction ([place])   1. Acute on chronic respiratory failure with hypoxia plan is to continue with T collar trials with the PMV patient secretions remain an issue for decannulation 2. Lobar pneumonia treated we will continue with present management 3.  Chronic congestive heart failure compensated 4. C7 fracture status post ACDF 5. Pulmonary embolism treated we will continue with supportive care   I have personally seen and evaluated the patient, evaluated laboratory and imaging results, formulated the assessment and plan and placed orders. The Patient requires high complexity decision making with multiple systems involvement.  Rounds were done with the Respiratory Therapy Director and Staff therapists and discussed with nursing staff also.  Yevonne Pax, MD [facility] Pulmonary Critical Care Medicine Sleep Medicine

## 2019-03-03 ENCOUNTER — Telehealth: Payer: Self-pay | Admitting: *Deleted

## 2019-03-03 DIAGNOSIS — J9621 Acute and chronic respiratory failure with hypoxia: Secondary | ICD-10-CM | POA: Diagnosis not present

## 2019-03-03 DIAGNOSIS — S12690S Other displaced fracture of seventh cervical vertebra, sequela: Secondary | ICD-10-CM | POA: Diagnosis not present

## 2019-03-03 DIAGNOSIS — I509 Heart failure, unspecified: Secondary | ICD-10-CM | POA: Diagnosis not present

## 2019-03-03 DIAGNOSIS — J181 Lobar pneumonia, unspecified organism: Secondary | ICD-10-CM | POA: Diagnosis not present

## 2019-03-03 LAB — TYPE AND SCREEN
ABO/RH(D): O POS
Antibody Screen: NEGATIVE
Unit division: 0

## 2019-03-03 LAB — MAGNESIUM: Magnesium: 2.6 mg/dL — ABNORMAL HIGH (ref 1.7–2.4)

## 2019-03-03 LAB — RENAL FUNCTION PANEL
Albumin: 2.4 g/dL — ABNORMAL LOW (ref 3.5–5.0)
Anion gap: 6 (ref 5–15)
BUN: 46 mg/dL — ABNORMAL HIGH (ref 6–20)
CO2: 26 mmol/L (ref 22–32)
Calcium: 9.8 mg/dL (ref 8.9–10.3)
Chloride: 107 mmol/L (ref 98–111)
Creatinine, Ser: 1.07 mg/dL (ref 0.61–1.24)
GFR calc Af Amer: >60 mL/min (ref >60)
GFR calc non Af Amer: >60 mL/min (ref >60)
Glucose, Bld: 120 mg/dL — ABNORMAL HIGH (ref 70–99)
Phosphorus: 3.3 mg/dL (ref 2.5–4.6)
Potassium: 4.5 mmol/L (ref 3.5–5.1)
Sodium: 139 mmol/L (ref 135–145)

## 2019-03-03 LAB — CBC
HCT: 25.7 % — ABNORMAL LOW (ref 39.0–52.0)
Hemoglobin: 7.8 g/dL — ABNORMAL LOW (ref 13.0–17.0)
MCH: 28.5 pg (ref 26.0–34.0)
MCHC: 30.4 g/dL (ref 30.0–36.0)
MCV: 93.8 fL (ref 80.0–100.0)
Platelets: 397 10*3/uL (ref 150–400)
RBC: 2.74 MIL/uL — ABNORMAL LOW (ref 4.22–5.81)
RDW: 16.2 % — ABNORMAL HIGH (ref 11.5–15.5)
WBC: 8.2 10*3/uL (ref 4.0–10.5)
nRBC: 0 % (ref 0.0–0.2)

## 2019-03-03 LAB — BPAM RBC
Blood Product Expiration Date: 202102222359
ISSUE DATE / TIME: 202101191224
Unit Type and Rh: 5100

## 2019-03-03 NOTE — Progress Notes (Addendum)
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 He was admitted to outside hospital on 11/23/2018. He required anterior cervical disc fusion and despite this developed paraplegia. Postoperatively patient was intubated and had respiratory complications after that. He developed pneumonia secondary to Klebsiella. He had prolonged ventilation and eventually underwent tracheostomy placement. Patient also had DVT in right lower extremity and left upper extremity which led to pulmonary embolism. He was initially on heparin drip and then transitioned to Eliquis. He also had a feeding tube placed. Apparently posterior surgical fusion was postponed secondary to the patient's medical condition and inability to stay prone for the surgery because of the respiratory failure. Due to his medical problems he was transferred to [facility]. After admission here patient has been having fever spikes. All cultures to date negative. Hewas treated withIV vancomycin, meropenem,fluconazole.However, continued to have fevers despite being on antibiotics. Therefore antibiotics were discontinued. Thought to be neuroleptic malignant syndrome? Due to psych medications. His psych medications were adjusted.After thatpatient started having diarrhea. He has stool for C. difficile checked in the past and it was negative. He was treated empirically with p.o. vancomycin. 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 CT of the abdomen and pelvis was done on 02/15/2019 showed dense consolidation of the left lower lobe  likely pneumonia. Left lower lobe bronchus narrowed, debris/mucus. Progression of the sacral decubitus ulcer without evidence of focal abscess or additional soft tissue gas.  He continued to have fever. Therefore was started on IV vancomycin, Zosyn. Initial vancomycin trough was low therefore the vancomycin dose was increased. However, subsequent vancomycin trough was found to be high. Creatinine also worsened. T-max 100.3. He also underwent debridement of his sacral pressure ulcer at bedside, no cultures. Vancomycin was therefore discontinued and he was switched to IV cefepime and Flagyl. 03/03/2019: He is afebrile.  Denies having any major complaints at this time.  He is status post 1 unit PRBC transfusion.  Hemoglobin 7.8.  Hemoccult was negative.  Creatinine also improving.  Assessment & Plan:   Active Problems:   Acute on chronic respiratory failure with hypoxia (HCC)   Lobar pneumonia, unspecified organism (HCC)   Chronic congestive heart failure (HCC)   Closed C7 fracture without spinal cord injury, sequela   Pulmonary embolism and infarction (HCC) Sacrococcygeal pressure ulcerstage IV Systemic inflammatory response syndrome Leukocytosis Acute kidney injury Diarrhea Right lower extremity/left upper extremity DVT Fevers COPD Obstructive sleep apnea Morbid obesity Protein calorie malnutrition Dysphagia History of schizoaffective disorder   Acute on chronic hypoxemic respiratory failure:Previouschest x-rayshowedmild right basilar atelectasis or infiltrate. Healready received treatment withIV vancomycin, meropenem, fluconazole. PreviousTracheal aspirate cultures showed rare gram-positive rods, normal respiratory flora.Currently on IV cefepime, Flagyl. Respiratory status appears to be stable at this time. Pulmonary following.  However, due to his chronic trach, he is at high risk for recurrent pneumonia, worsening respiratory failure, recurrent  tracheobronchitis.  Pneumonia:Already treated with IV vancomycin,meropenem,fluconazole. Then restarted with IV Zosyn, fluconazole due to his worsening sacral pressure ulcer. IV vancomycin was added due to on and off fevers.  Initial vancomycin trough was low but subsequent Vanco troughs were high. Therefore vancomycin  was discontinued and switched to IV cefepime  and Flagyl.  Respiratory status stable at this time.However, he is high risk for recurrent pneumonia, worsening respiratory failure. If his respiratory statusworsens,would recommend CT imaging for better evaluation.   Fever: Patientwashaving on and off fevers since his admission here despite being on antibiotics. Previous respiratory cultures showed normal flora.Therewas also concern for aspiration.Initiallyfevers were thought to be either secondary to DVT/PE versus neurolept malignant syndrome. His psychiatric medications were adjusted.He was also treated with antimicrobials and then with p.o. vancomycinfor diarrhea. -Due to low-grade fevers and concern for infected sacral ulcer he was restarted onfluconazole, Zosyn. IV vancomycin was also added. Initial Vanc trough was low therefore the vancomycin dose was increased. However, subsequent trough came back elevated, creatinine also trending up. Therefore vancomycin was discontinued.  On 02/26/2019 Zosyn and fluconazole was discontinued and he was switched to IV cefepime and Flagyl.  Currently afebrile.  Sacrococcygeal pressure ulcer, stage IV: He is status post debridement on 01/22/2019, 02/03/2019, 02/23/2019.  Currently has wound VAC in place.  Continue local wound care.Vancomycin discontinued.  Antibiotics switched to IV cefepime, Flagyl.  Continue to monitor.   Diarrhea: He has been tested for C. difficile in the past and has been negative. Diarrhea could be secondary to tube feeds?  He was treated with p.o. vancomycin.  Subsequently treated with a trial of  cholestyramine.  Per the primary team his diarrhea improved with the cholestyramine.  However, he again started having worsening diarrhea again.    Recommended cholestyramine.  If diarrhea persistent, then consider adding p.o. vancomycin.  Leukocytosis: Patient had elevated WBC count 21.7at one-point. CT imaging findings as mentioned above. Concern for infected sacrococcygeal pressure ulcer. Vancomycin discontinued due to acute kidney injury and switched to IV cefepime and Flagyl due to ongoing fevers.   He also underwent bedside debridement of his sacral pressure ulcer.  Currently WBC count stable. Continue to monitor.  However, he is at high risk for recurrent fevers despite being on antibiotics due to his infected sacral pressure ulcer and also concern for aspiration.  Acute kidney injury: Likely secondary to IV vancomycin?  Vancomycin has been discontinued. Switched to IV cefepime and Flagyl.  Creatinine improved with IV fluids, albumin infusion.  Continue to monitor.  Right lower extremity DVT/left upper extremity DVT/pulmonary embolism: He was on IV heparin at the outside facility now transitioned to Eliquis. Further management per the primary team.  C7 fracture status post ACDF surgery: Surgical site stable. Unfortunately has paraplegia with weakness. Continue supportive management per the primary team.  COPD: Continue medication and management per primary team and pulmonary.  Morbid obesity/obstructive sleep apnea: Patient admits to not using the CPAP. Continue management per primary team and pulmonary.  Protein calorie malnutrition: Continue management per primary team.  Dysphagia: Unfortunately due to his dysphagia he is high risk for aspiration and worsening respiratory failure secondary to aspiration pneumonia.  Schizoaffective disorder: Medicationsand management per the primary team.  Plan of care discussed with the primary team.  Due to his complex medical problems  he is high risk for worsening and decompensation.  Subjective: He denies having any major complaints at this time.  Awake and oriented.  Hemoglobin today 7.8 status post 1 unit PRBC.  WBC count also improved.  Objective: Temperature 97.5, heart rate 82, blood pressure 138/56, respiratory rate 16, oxygen saturation 100%  Examination: General exam:Morbidly obese male, awake, not in any acute distress HEENT:Atraumatic, normocephalic, pupils equal and reactive, ear or nose lesions. Neck:Trach, has c-collar in place Respiratory system:Occasional rhonchi, no wheezing anteriorly  Cardiovascular system:S1 &S2 heard, no murmur. Gastrointestinal system:Obese male,soft and nontender. Normal bowel sounds heard. Central nervous system:Weakness in all extremities, worse in lower extremities Extremities:mild edema lower extremity Skin: No rashes,sacrococcygeal pressure ulcerwith wound VAC Psychiatry:Mood &affect appropriate.     Data Reviewed: I have personally reviewed following labs and imaging studies  CBC: Recent Labs  Lab 02/25/19 0524 02/26/19 0339 03/01/19 0554 03/02/19 0635 03/03/19 0450  WBC 11.7* 11.0* 8.5 8.8 8.2  HGB 7.3* 7.2* 7.1* 6.8* 7.8*  HCT 25.2* 25.1* 24.4* 23.2* 25.7*  MCV 95.5 95.4 94.9 94.7 93.8  PLT 366 326 318 312 397   Basic Metabolic Panel: Recent Labs  Lab 02/25/19 0524 02/25/19 0524 02/26/19 0339 02/27/19 0626 03/01/19 0554 03/02/19 0635 03/03/19 0450  NA 149*   < > 144 145 144 143 139  K 4.4   < > 4.1 4.2 4.7 4.6 4.5  CL 107   < > 105 109 108 108 107  CO2 29   < > 29 27 26 25 26   GLUCOSE 146*   < > 183* 159* 143* 147* 120*  BUN 41*   < > 43* 40* 41* 45* 46*  CREATININE 1.67*   < > 1.53* 1.49* 1.21 1.13 1.07  CALCIUM 9.1   < > 9.1 9.3 9.6 9.7 9.8  MG 2.8*  --   --   --  2.8* 2.6* 2.6*  PHOS 3.8  --  4.0  --   --  3.2 3.3   < > = values in this interval not displayed.   GFR: CrCl cannot be calculated (Unknown ideal  weight.). Liver Function Tests: Recent Labs  Lab 02/25/19 0524 02/26/19 0339 03/02/19 0635 03/03/19 0450  ALBUMIN 1.7* 1.8* 2.4* 2.4*   No results for input(s): LIPASE, AMYLASE in the last 168 hours. No results for input(s): AMMONIA in the last 168 hours. Coagulation Profile: No results for input(s): INR, PROTIME in the last 168 hours. Cardiac Enzymes: No results for input(s): CKTOTAL, CKMB, CKMBINDEX, TROPONINI in the last 168 hours. BNP (last 3 results) No results for input(s): PROBNP in the last 8760 hours. HbA1C: No results for input(s): HGBA1C in the last 72 hours. CBG: No results for input(s): GLUCAP in the last 168 hours. Lipid Profile: No results for input(s): CHOL, HDL, LDLCALC, TRIG, CHOLHDL, LDLDIRECT in the last 72 hours. Thyroid Function Tests: No results for input(s): TSH, T4TOTAL, FREET4, T3FREE, THYROIDAB in the last 72 hours. Anemia Panel: No results for input(s): VITAMINB12, FOLATE, FERRITIN, TIBC, IRON, RETICCTPCT in the last 72 hours. Sepsis Labs: No results for input(s): PROCALCITON, LATICACIDVEN in the last 168 hours.  No results found for this or any previous visit (from the past 240 hour(s)).       Radiology Studies: No results found.   Scheduled Meds: Please see MAR   03/05/19, MD  03/03/2019, 6:16 PM

## 2019-03-03 NOTE — Telephone Encounter (Signed)
[id number]/Raphael Fitzpatrick,BSN,RN3,CCM,CN: Patient requesting prayers, introduced myself as from [facility], had short prayer with patient. He acknowledge satisfaction of the visit. Gave him my name and offewred to return if he wanted more spritiual support.Marland Kitchen

## 2019-03-03 NOTE — Progress Notes (Signed)
Pulmonary Critical Care Medicine [facility]   PULMONARY CRITICAL CARE SERVICE  PROGRESS NOTE  Date of Service: 03/03/2019  Raymond Avila  XHB:[id number]  DOB: June 01, 1965   DOA: 12/23/2018  Referring Physician: [name], MD  HPI: Raymond Avila is a 54 y.o. male seen for follow up of Acute on Chronic Respiratory Failure.  Patient is on T collar currently on 28% FiO2 using the PMV  Medications: Reviewed on Rounds  Physical Exam:  Vitals: Temperature is 97.5 pulse 85 respiratory 16 blood pressure is 137/57 saturations 100%  Ventilator Settings currently on T collar FiO2 28%  . General: Comfortable at this time . Eyes: Grossly normal lids, irises & conjunctiva . ENT: grossly tongue is normal . Neck: no obvious mass . Cardiovascular: S1 S2 normal no gallop . Respiratory: No rhonchi no rales are noted at this time . Abdomen: soft . Skin: no rash seen on limited exam . Musculoskeletal: not rigid . Psychiatric:unable to assess . Neurologic: no seizure no involuntary movements         Lab Data:   Basic Metabolic Panel: Recent Labs  Lab 02/24/19 1022 02/24/19 1022 02/25/19 0524 02/25/19 0524 02/26/19 0339 02/27/19 0626 03/01/19 0554 03/02/19 0635 03/03/19 0450  NA 148*   < > 149*   < > 144 145 144 143 139  K 3.9   < > 4.4   < > 4.1 4.2 4.7 4.6 4.5  CL 109   < > 107   < > 105 109 108 108 107  CO2 30   < > 29   < > 29 27 26 25 26   GLUCOSE 164*   < > 146*   < > 183* 159* 143* 147* 120*  BUN 41*   < > 41*   < > 43* 40* 41* 45* 46*  CREATININE 1.58*   < > 1.67*   < > 1.53* 1.49* 1.21 1.13 1.07  CALCIUM 9.0   < > 9.1   < > 9.1 9.3 9.6 9.7 9.8  MG 2.8*  --  2.8*  --   --   --  2.8* 2.6* 2.6*  PHOS 3.6  --  3.8  --  4.0  --   --  3.2 3.3   < > = values in this interval not displayed.    ABG: No results for input(s): PHART, PCO2ART, PO2ART, HCO3, O2SAT in the last 168 hours.  Liver Function Tests: Recent Labs  Lab 02/24/19 1022 02/25/19 0524  02/26/19 0339 03/02/19 0635 03/03/19 0450  ALBUMIN 1.7* 1.7* 1.8* 2.4* 2.4*   No results for input(s): LIPASE, AMYLASE in the last 168 hours. No results for input(s): AMMONIA in the last 168 hours.  CBC: Recent Labs  Lab 02/25/19 0524 02/26/19 0339 03/01/19 0554 03/02/19 0635 03/03/19 0450  WBC 11.7* 11.0* 8.5 8.8 8.2  HGB 7.3* 7.2* 7.1* 6.8* 7.8*  HCT 25.2* 25.1* 24.4* 23.2* 25.7*  MCV 95.5 95.4 94.9 94.7 93.8  PLT 366 326 318 312 397    Cardiac Enzymes: No results for input(s): CKTOTAL, CKMB, CKMBINDEX, TROPONINI in the last 168 hours.  BNP (last 3 results) No results for input(s): BNP in the last 8760 hours.  ProBNP (last 3 results) No results for input(s): PROBNP in the last 8760 hours.  Radiological Exams: No results found.  Assessment/Plan Active Problems:   Acute on chronic respiratory failure with hypoxia (HCC)   Lobar pneumonia, unspecified organism (HCC)   Chronic congestive heart failure (HCC)  Closed C7 fracture without spinal cord injury, sequela   Pulmonary embolism and infarction ([place])   1. Acute on chronic respiratory failure hypoxia plan is to continue with full support on T collar continue secretion management pulmonary toilet. 2. Lobar pneumonia unchanged we will continue present management 3. Chronic congestive heart failure right now appears to be compensated 4. C7 fracture no change we will continue with supportive care 5. Pulmonary embolism treated   I have personally seen and evaluated the patient, evaluated laboratory and imaging results, formulated the assessment and plan and placed orders. The Patient requires high complexity decision making with multiple systems involvement.  Rounds were done with the Respiratory Therapy Director and Staff therapists and discussed with nursing staff also.  Allyne Gee, MD [facility] Pulmonary Critical Care Medicine Sleep Medicine

## 2019-03-04 DIAGNOSIS — J181 Lobar pneumonia, unspecified organism: Secondary | ICD-10-CM | POA: Diagnosis not present

## 2019-03-04 DIAGNOSIS — S12690S Other displaced fracture of seventh cervical vertebra, sequela: Secondary | ICD-10-CM | POA: Diagnosis not present

## 2019-03-04 DIAGNOSIS — J9621 Acute and chronic respiratory failure with hypoxia: Secondary | ICD-10-CM | POA: Diagnosis not present

## 2019-03-04 DIAGNOSIS — I509 Heart failure, unspecified: Secondary | ICD-10-CM | POA: Diagnosis not present

## 2019-03-04 LAB — RENAL FUNCTION PANEL
Albumin: 2.4 g/dL — ABNORMAL LOW (ref 3.5–5.0)
Anion gap: 10 (ref 5–15)
BUN: 46 mg/dL — ABNORMAL HIGH (ref 6–20)
CO2: 25 mmol/L (ref 22–32)
Calcium: 9.9 mg/dL (ref 8.9–10.3)
Chloride: 108 mmol/L (ref 98–111)
Creatinine, Ser: 1.02 mg/dL (ref 0.61–1.24)
GFR calc Af Amer: >60 mL/min (ref >60)
GFR calc non Af Amer: >60 mL/min (ref >60)
Glucose, Bld: 138 mg/dL — ABNORMAL HIGH (ref 70–99)
Phosphorus: 3.5 mg/dL (ref 2.5–4.6)
Potassium: 4.4 mmol/L (ref 3.5–5.1)
Sodium: 143 mmol/L (ref 135–145)

## 2019-03-04 LAB — CBC
HCT: 26.5 % — ABNORMAL LOW (ref 39.0–52.0)
Hemoglobin: 8 g/dL — ABNORMAL LOW (ref 13.0–17.0)
MCH: 28.3 pg (ref 26.0–34.0)
MCHC: 30.2 g/dL (ref 30.0–36.0)
MCV: 93.6 fL (ref 80.0–100.0)
Platelets: 376 10*3/uL (ref 150–400)
RBC: 2.83 MIL/uL — ABNORMAL LOW (ref 4.22–5.81)
RDW: 16.4 % — ABNORMAL HIGH (ref 11.5–15.5)
WBC: 8.6 10*3/uL (ref 4.0–10.5)
nRBC: 0 % (ref 0.0–0.2)

## 2019-03-04 LAB — MAGNESIUM: Magnesium: 2.4 mg/dL (ref 1.7–2.4)

## 2019-03-04 NOTE — Progress Notes (Signed)
Pulmonary Critical Care Medicine [facility] [place]   PULMONARY CRITICAL CARE SERVICE  PROGRESS NOTE  Date of Service: 03/04/2019  Raymond Avila  CXK:[id number]  DOB: 08/26/1965   DOA: 12/23/2018  Referring Physician: [name], MD  HPI: Raymond Avila is a 54 y.o. male seen for follow up of Acute on Chronic Respiratory Failure.  Patient currently is on T collar has been on 28% FiO2 good saturations are noted is at baseline  Medications: Reviewed on Rounds  Physical Exam:  Vitals: Temperature 97.3 pulse 76 respiratory 27 blood pressure is 115/62 saturations 98%  Ventilator Settings off the ventilator on T collar FiO2 28%  . General: Comfortable at this time . Eyes: Grossly normal lids, irises & conjunctiva . ENT: grossly tongue is normal . Neck: no obvious mass . Cardiovascular: S1 S2 normal no gallop . Respiratory: No rhonchi no rales are noted at this time . Abdomen: soft . Skin: no rash seen on limited exam . Musculoskeletal: not rigid . Psychiatric:unable to assess . Neurologic: no seizure no involuntary movements         Lab Data:   Basic Metabolic Panel: Recent Labs  Lab 02/26/19 0339 02/26/19 0339 02/27/19 0626 03/01/19 0554 03/02/19 0635 03/03/19 0450 03/04/19 0516  NA 144   < > 145 144 143 139 143  K 4.1   < > 4.2 4.7 4.6 4.5 4.4  CL 105   < > 109 108 108 107 108  CO2 29   < > 27 26 25 26 25   GLUCOSE 183*   < > 159* 143* 147* 120* 138*  BUN 43*   < > 40* 41* 45* 46* 46*  CREATININE 1.53*   < > 1.49* 1.21 1.13 1.07 1.02  CALCIUM 9.1   < > 9.3 9.6 9.7 9.8 9.9  MG  --   --   --  2.8* 2.6* 2.6* 2.4  PHOS 4.0  --   --   --  3.2 3.3 3.5   < > = values in this interval not displayed.    ABG: No results for input(s): PHART, PCO2ART, PO2ART, HCO3, O2SAT in the last 168 hours.  Liver Function Tests: Recent Labs  Lab 02/26/19 0339 03/02/19 0635 03/03/19 0450 03/04/19 0516  ALBUMIN 1.8* 2.4* 2.4* 2.4*   No results for input(s):  LIPASE, AMYLASE in the last 168 hours. No results for input(s): AMMONIA in the last 168 hours.  CBC: Recent Labs  Lab 02/26/19 0339 03/01/19 0554 03/02/19 0635 03/03/19 0450 03/04/19 0516  WBC 11.0* 8.5 8.8 8.2 8.6  HGB 7.2* 7.1* 6.8* 7.8* 8.0*  HCT 25.1* 24.4* 23.2* 25.7* 26.5*  MCV 95.4 94.9 94.7 93.8 93.6  PLT 326 318 312 397 376    Cardiac Enzymes: No results for input(s): CKTOTAL, CKMB, CKMBINDEX, TROPONINI in the last 168 hours.  BNP (last 3 results) No results for input(s): BNP in the last 8760 hours.  ProBNP (last 3 results) No results for input(s): PROBNP in the last 8760 hours.  Radiological Exams: No results found.  Assessment/Plan Active Problems:   Acute on chronic respiratory failure with hypoxia (HCC)   Lobar pneumonia, unspecified organism (HCC)   Chronic congestive heart failure (HCC)   Closed C7 fracture without spinal cord injury, sequela   Pulmonary embolism and infarction (HCC)   1. Acute on chronic respiratory failure hypoxia plan is to continue with T collar trials titrate oxygen continue with the aggressive pulmonary toilet. 2. Lobar pneumonia resolved being followed by infectious  disease 3. Chronic congestive heart failure at baseline we will continue with supportive care 4. C7 fracture no change 5. Pulmonary embolism treated improved with continue with supportive care   I have personally seen and evaluated the patient, evaluated laboratory and imaging results, formulated the assessment and plan and placed orders. The Patient requires high complexity decision making with multiple systems involvement.  Rounds were done with the Respiratory Therapy Director and Staff therapists and discussed with nursing staff also.  Allyne Gee, MD [facility] Pulmonary Critical Care Medicine Sleep Medicine

## 2019-03-04 NOTE — Progress Notes (Signed)
PROGRESS NOTE    Raymond Avila  JIR:[id number] DOB: [birth year] DOA: 12/23/2018  Brief Narrative:  Raymond Beecham Shearinis an 54 y.o.malewith morbid obesity, schizoaffective disorder, Tourette syndrome, COPD, hypertension, obstructive sleep apnea who had a recent fall with neck trauma which caused C7/T1jumped facets in his neck with anterior listhesis and severe spinal canalnarrowing with spinal cord injury and paraplegia. He was admitted to outside hospital on 11/23/2018. He required anterior cervical disc fusion and despite this developed paraplegia. Postoperatively patient was intubated and had respiratory complications after that. He developed pneumonia secondary to Klebsiella. He had prolonged ventilation and eventually underwent tracheostomy placement. Patient also had DVT in right lower extremity and left upper extremity which led to pulmonary embolism. He was initially on heparin drip and then transitioned to Eliquis. He also had a feeding tube placed. Apparently posterior surgical fusion was postponed secondary to the patient's medical condition and inability to stay prone for the surgery because of the respiratory failure. Due to his medical problems he was transferred to [facility]. After admission here patient has been having fever spikes. All cultures to date negative. Hewas treated withIV vancomycin, meropenem,fluconazole.However, continued to have fevers despite being on antibiotics. Therefore antibioticswerediscontinued. Thought to be neuroleptic malignant syndrome? Due to psych medications. His psych medications were adjusted.After thatpatient started having diarrhea. He has stool for C. difficile checked in the past and it was negative. He was treated empirically with p.o. vancomycin. 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 CT of the abdomen and pelvis was done on 02/15/2019 showed dense consolidation of the left lower lobe  likely pneumonia. Left lower lobe bronchus narrowed, debris/mucus. Progression of the sacral decubitus ulcer without evidence of focal abscess or additional soft tissue gas.  He continued to have fever. Therefore was started on IV vancomycin, Zosyn. Initial vancomycin trough was low therefore the vancomycin dose was increased. However,subsequentvancomycin trough was found to behigh. Creatinine also worsened. T-max 100.3. He also underwent debridement of his sacral pressure ulcer at bedside, no cultures. Vancomycin was therefore discontinued and he was switched to IV cefepime and Flagyl. 03/04/2019: Diarrhea improving.  Hemoglobin today 8. Creatinine also improving.  He denies having any major complaints.   Assessment & Plan:   Active Problems:   Acute on chronic respiratory failure with hypoxia (HCC)   Lobar pneumonia, unspecified organism (HCC)   Chronic congestive heart failure (HCC)   Closed C7 fracture without spinal cord injury, sequela   Pulmonary embolism and infarction (HCC) Sacrococcygeal pressure ulcerstage IV Systemic inflammatory response syndrome Leukocytosis Acute kidney injury Diarrhea Right lower extremity/left upper extremity DVT Fevers COPD Obstructive sleep apnea Morbid obesity Protein calorie malnutrition Dysphagia History of schizoaffective disorder   Acute on chronic hypoxemic respiratory failure:Previouschest x-rayshowedmild right basilar atelectasis or infiltrate. Healready received treatment withIV vancomycin, meropenem, fluconazole. PreviousTracheal aspirate cultures showedrare gram-positive rods, normal respiratory flora.Currently on IV cefepime, Flagyl. Respiratory status appears to be stable at this time. Pulmonary following.However, due to his chronic trach, he is at high risk for recurrent pneumonia, worsening respiratory failure, recurrent tracheobronchitis.  Pneumonia:Already treated with IV vancomycin,meropenem,fluconazole.  Then restarted with IV Zosyn, fluconazole due to his worsening sacral pressure ulcer. IV vancomycin was addeddue to on and off fevers. Initial vancomycin trough was low but subsequent Vanco troughs were high. Therefore vancomycin was discontinued and switched to IV cefepime and Flagyl.  Respiratory status stable at this time.However,he ishigh risk for recurrent pneumonia, worsening respiratory failure. If his respiratory statusworsens,would recommend CT imaging for better  evaluation.   Fever: Patientwaspreviously havingon and offfeverssince his admission here despite being on antibiotics. Previous respiratory cultures showednormal flora.Therewas also concern for aspiration.Initiallyfevers were thought to be either secondary to DVT/PE versus neurolept malignant syndrome. His psychiatric medications were adjusted.He was also treated with antimicrobials and then with p.o. vancomycinfor diarrhea. -Due to low-grade fevers and concern for infected sacral ulcer he was restarted onfluconazole, Zosyn. IV vancomycin was also added. InitialVanctrough was low therefore the vancomycin dose was increased. However, subsequent trough came back elevated, creatinine also trending up. Therefore vancomycin was discontinued.  On 02/26/2019 Zosyn and fluconazole was discontinued and he was switchedto IV cefepime and Flagyl. Remains afebrile.  Sacrococcygeal pressure ulcer, stage IV: He is status post debridement on 01/22/2019, 02/03/2019, 02/23/2019.  Currently has wound VAC in place.  Vancomycindiscontinued.Antibiotics switched to IV cefepime, Flagyl.  03/04/2019: If the wounds have improved then complete the antibiotic therapy.  However, if the wounds are still not improved then extend treatment with IV cefepime, Flagyl for another week.  Continue to monitor. Unfortunately, due to his debility and decreased mobility he is very high risk for further worsening of the wounds.  Diarrhea: He has  been tested for C. difficile in the past and has been negative.Diarrhea could be secondary to tube feeds?He was treated with p.o. vancomycin. Subsequently treated with a trial of cholestyramine. Per the primary team his diarrhea improved with the cholestyramine. However, he again started having worsening diarrhea again.   Recommended cholestyramine.   03/04/2019: Per the primary team diarrhea is improving.  Leukocytosis: Patient had elevated WBC count 21.7at one-point. CT imaging findings as mentioned above. Concern for infected sacrococcygeal pressure ulcer. Vancomycin discontinued due to acute kidney injury and switched to IV cefepime and Flagyl due to ongoing fevers.  He also underwent bedside debridement of his sacral pressure ulcer.  Currently WBC count stable.Continue to monitor. However, he is at high risk for recurrent fevers despite being on antibiotics due to high risk of worsening of sacral pressure ulcer and also concern for aspiration.  Acute kidney injury: Likely secondary to IV vancomycin?  Vancomycin has been discontinued. Currently on IV cefepime and Flagyl.  Creatinine improved with IV fluids, albumin infusion.  Continue to monitor.  Right lower extremity DVT/left upper extremity DVT/pulmonary embolism: He was on IV heparin at the outside facility now transitioned to Eliquis. Further management per the primary team.  C7 fracture status post ACDF surgery: Surgical site stable. Unfortunately has paraplegia with weakness. Continue supportive management per the primary team.  COPD: Continue medication and management per primary team and pulmonary.  Morbid obesity/obstructive sleep apnea: Patient admits to not using the CPAP. Continue management per primary team and pulmonary.  Protein calorie malnutrition: Continue management per primary team.  Dysphagia: Unfortunately due to his dysphagia he is high risk for aspiration and worsening respiratory failure  secondary to aspiration pneumonia.  Schizoaffective disorder: Medicationsand management per the primary team.  Plan of care discussed with the primary team. Due to his complex medical problems he is high risk for worsening and decompensation.   Subjective: Afebrile.  Denies having any major complaints at this time.  Objective: Vitals: Temperature 97.6, pulse 78, blood pressure 115/62, respiratory rate 27, on FiO2 28%  Examination: General exam:Morbidly obese male, awake, not in any acute distressat this time HEENT:Atraumatic, normocephalic, pupils equal and reactive, ear or nose lesions. Neck:Trach, has c-collar in place Respiratory system:Occasional rhonchi, no wheezing anteriorly Cardiovascular system:S1 &S2 heard, no murmur. Gastrointestinal system:Obese male,soft and nontender. Normal bowel  sounds heard. Central nervous system:Weakness in all extremities, worse in lower extremities Extremities:mild edema lower extremity Skin: No rashes,sacrococcygeal pressure ulcerwith wound VAC Psychiatry:Mood &affect appropriate.    Data Reviewed: I have personally reviewed following labs and imaging studies  CBC: Recent Labs  Lab 02/26/19 0339 03/01/19 0554 03/02/19 0635 03/03/19 0450 03/04/19 0516  WBC 11.0* 8.5 8.8 8.2 8.6  HGB 7.2* 7.1* 6.8* 7.8* 8.0*  HCT 25.1* 24.4* 23.2* 25.7* 26.5*  MCV 95.4 94.9 94.7 93.8 93.6  PLT 326 318 312 397 376   Basic Metabolic Panel: Recent Labs  Lab 02/26/19 0339 02/26/19 0339 02/27/19 0626 03/01/19 0554 03/02/19 0635 03/03/19 0450 03/04/19 0516  NA 144   < > 145 144 143 139 143  K 4.1   < > 4.2 4.7 4.6 4.5 4.4  CL 105   < > 109 108 108 107 108  CO2 29   < > 27 26 25 26 25   GLUCOSE 183*   < > 159* 143* 147* 120* 138*  BUN 43*   < > 40* 41* 45* 46* 46*  CREATININE 1.53*   < > 1.49* 1.21 1.13 1.07 1.02  CALCIUM 9.1   < > 9.3 9.6 9.7 9.8 9.9  MG  --   --   --  2.8* 2.6* 2.6* 2.4  PHOS 4.0  --   --   --  3.2 3.3  3.5   < > = values in this interval not displayed.   GFR: CrCl cannot be calculated (Unknown ideal weight.). Liver Function Tests: Recent Labs  Lab 02/26/19 0339 03/02/19 0635 03/03/19 0450 03/04/19 0516  ALBUMIN 1.8* 2.4* 2.4* 2.4*   No results for input(s): LIPASE, AMYLASE in the last 168 hours. No results for input(s): AMMONIA in the last 168 hours. Coagulation Profile: No results for input(s): INR, PROTIME in the last 168 hours. Cardiac Enzymes: No results for input(s): CKTOTAL, CKMB, CKMBINDEX, TROPONINI in the last 168 hours. BNP (last 3 results) No results for input(s): PROBNP in the last 8760 hours. HbA1C: No results for input(s): HGBA1C in the last 72 hours. CBG: No results for input(s): GLUCAP in the last 168 hours. Lipid Profile: No results for input(s): CHOL, HDL, LDLCALC, TRIG, CHOLHDL, LDLDIRECT in the last 72 hours. Thyroid Function Tests: No results for input(s): TSH, T4TOTAL, FREET4, T3FREE, THYROIDAB in the last 72 hours. Anemia Panel: No results for input(s): VITAMINB12, FOLATE, FERRITIN, TIBC, IRON, RETICCTPCT in the last 72 hours. Sepsis Labs: No results for input(s): PROCALCITON, LATICACIDVEN in the last 168 hours.  No results found for this or any previous visit (from the past 240 hour(s)).       Radiology Studies: No results found.      Scheduled Meds: Please see MAR    03/06/19, MD  03/04/2019, 2:09 PM

## 2019-03-05 ENCOUNTER — Encounter (HOSPITAL_COMMUNITY): Payer: Medicare Other

## 2019-03-05 DIAGNOSIS — J181 Lobar pneumonia, unspecified organism: Secondary | ICD-10-CM | POA: Diagnosis not present

## 2019-03-05 DIAGNOSIS — S12690S Other displaced fracture of seventh cervical vertebra, sequela: Secondary | ICD-10-CM | POA: Diagnosis not present

## 2019-03-05 DIAGNOSIS — I509 Heart failure, unspecified: Secondary | ICD-10-CM | POA: Diagnosis not present

## 2019-03-05 DIAGNOSIS — J9621 Acute and chronic respiratory failure with hypoxia: Secondary | ICD-10-CM | POA: Diagnosis not present

## 2019-03-05 NOTE — Progress Notes (Signed)
Pulmonary Critical Care Medicine [facility] [place]   PULMONARY CRITICAL CARE SERVICE  PROGRESS NOTE  Date of Service: 03/05/2019  Raymond Avila  ZOX:[id number]  DOB: 05/11/1965   DOA: 12/23/2018  Referring Physician: [name], MD  HPI: Raymond Avila is a 54 y.o. male seen for follow up of Acute on Chronic Respiratory Failure.  Patient at this time is on T collar has been on 28% FiO2 with good saturations  Medications: Reviewed on Rounds  Physical Exam:  Vitals: Temperature 99.7 pulse 96 respiratory rate 20 blood pressure is 96/48 saturations 100%  Ventilator Settings off the ventilator right now on T collar  . General: Comfortable at this time . Eyes: Grossly normal lids, irises & conjunctiva . ENT: grossly tongue is normal . Neck: no obvious mass . Cardiovascular: S1 S2 normal no gallop . Respiratory: No rhonchi no rales are noted at this time . Abdomen: soft . Skin: no rash seen on limited exam . Musculoskeletal: not rigid . Psychiatric:unable to assess . Neurologic: no seizure no involuntary movements         Lab Data:   Basic Metabolic Panel: Recent Labs  Lab 02/27/19 0626 03/01/19 0554 03/02/19 0635 03/03/19 0450 03/04/19 0516  NA 145 144 143 139 143  K 4.2 4.7 4.6 4.5 4.4  CL 109 108 108 107 108  CO2 27 26 25 26 25   GLUCOSE 159* 143* 147* 120* 138*  BUN 40* 41* 45* 46* 46*  CREATININE 1.49* 1.21 1.13 1.07 1.02  CALCIUM 9.3 9.6 9.7 9.8 9.9  MG  --  2.8* 2.6* 2.6* 2.4  PHOS  --   --  3.2 3.3 3.5    ABG: No results for input(s): PHART, PCO2ART, PO2ART, HCO3, O2SAT in the last 168 hours.  Liver Function Tests: Recent Labs  Lab 03/02/19 0635 03/03/19 0450 03/04/19 0516  ALBUMIN 2.4* 2.4* 2.4*   No results for input(s): LIPASE, AMYLASE in the last 168 hours. No results for input(s): AMMONIA in the last 168 hours.  CBC: Recent Labs  Lab 03/01/19 0554 03/02/19 0635 03/03/19 0450 03/04/19 0516  WBC 8.5 8.8 8.2 8.6  HGB  7.1* 6.8* 7.8* 8.0*  HCT 24.4* 23.2* 25.7* 26.5*  MCV 94.9 94.7 93.8 93.6  PLT 318 312 397 376    Cardiac Enzymes: No results for input(s): CKTOTAL, CKMB, CKMBINDEX, TROPONINI in the last 168 hours.  BNP (last 3 results) No results for input(s): BNP in the last 8760 hours.  ProBNP (last 3 results) No results for input(s): PROBNP in the last 8760 hours.  Radiological Exams: No results found.  Assessment/Plan Active Problems:   Acute on chronic respiratory failure with hypoxia (HCC)   Lobar pneumonia, unspecified organism (HCC)   Chronic congestive heart failure (HCC)   Closed C7 fracture without spinal cord injury, sequela   Pulmonary embolism and infarction (HCC)   1. Acute on chronic respiratory failure with hypoxia patient is currently continued on T collar has been doing fine with 28% FiO2 2. Lobar pneumonia treated we will continue with present management 3. Chronic congestive heart failure follow-up pleural fluid status diuretics as tolerated 4. C7 fracture status post ACDF 5. Embolism treated we will continue to monitor   I have personally seen and evaluated the patient, evaluated laboratory and imaging results, formulated the assessment and plan and placed orders. The Patient requires high complexity decision making with multiple systems involvement.  Rounds were done with the Respiratory Therapy Director and Staff therapists and discussed with  nursing staff also.  Allyne Gee, MD [facility] Pulmonary Critical Care Medicine Sleep Medicine

## 2019-03-06 DIAGNOSIS — J181 Lobar pneumonia, unspecified organism: Secondary | ICD-10-CM | POA: Diagnosis not present

## 2019-03-06 DIAGNOSIS — S12690S Other displaced fracture of seventh cervical vertebra, sequela: Secondary | ICD-10-CM | POA: Diagnosis not present

## 2019-03-06 DIAGNOSIS — I509 Heart failure, unspecified: Secondary | ICD-10-CM | POA: Diagnosis not present

## 2019-03-06 DIAGNOSIS — J9621 Acute and chronic respiratory failure with hypoxia: Secondary | ICD-10-CM | POA: Diagnosis not present

## 2019-03-06 LAB — RENAL FUNCTION PANEL
Albumin: 2.7 g/dL — ABNORMAL LOW (ref 3.5–5.0)
Anion gap: 12 (ref 5–15)
BUN: 46 mg/dL — ABNORMAL HIGH (ref 6–20)
CO2: 22 mmol/L (ref 22–32)
Calcium: 10.4 mg/dL — ABNORMAL HIGH (ref 8.9–10.3)
Chloride: 106 mmol/L (ref 98–111)
Creatinine, Ser: 1.08 mg/dL (ref 0.61–1.24)
GFR calc Af Amer: >60 mL/min (ref >60)
GFR calc non Af Amer: >60 mL/min (ref >60)
Glucose, Bld: 145 mg/dL — ABNORMAL HIGH (ref 70–99)
Phosphorus: 3.7 mg/dL (ref 2.5–4.6)
Potassium: 4.7 mmol/L (ref 3.5–5.1)
Sodium: 140 mmol/L (ref 135–145)

## 2019-03-06 LAB — CBC
HCT: 29 % — ABNORMAL LOW (ref 39.0–52.0)
Hemoglobin: 8.9 g/dL — ABNORMAL LOW (ref 13.0–17.0)
MCH: 28.4 pg (ref 26.0–34.0)
MCHC: 30.7 g/dL (ref 30.0–36.0)
MCV: 92.7 fL (ref 80.0–100.0)
Platelets: 424 10*3/uL — ABNORMAL HIGH (ref 150–400)
RBC: 3.13 MIL/uL — ABNORMAL LOW (ref 4.22–5.81)
RDW: 17.4 % — ABNORMAL HIGH (ref 11.5–15.5)
WBC: 10.2 10*3/uL (ref 4.0–10.5)
nRBC: 0 % (ref 0.0–0.2)

## 2019-03-06 LAB — MAGNESIUM: Magnesium: 2.5 mg/dL — ABNORMAL HIGH (ref 1.7–2.4)

## 2019-03-06 NOTE — Progress Notes (Signed)
Pulmonary Critical Care Medicine [facility] [place]   PULMONARY CRITICAL CARE SERVICE  PROGRESS NOTE  Date of Service: 03/06/2019  Raymond Avila  JKD:[id number]  DOB: March 13, 1965   DOA: 12/23/2018  Referring Physician: [name], MD  HPI: Raymond Avila is a 54 y.o. male seen for follow up of Acute on Chronic Respiratory Failure.  Patient is on T collar currently on 28% FiO2 with good saturations noted  Medications: Reviewed on Rounds  Physical Exam:  Vitals: Temperature 97.6 pulse 81 respiratory 18 blood pressure is 114/66 saturations 99%  Ventilator Settings on T collar currently on 28% FiO2  . General: Comfortable at this time . Eyes: Grossly normal lids, irises & conjunctiva . ENT: grossly tongue is normal . Neck: no obvious mass . Cardiovascular: S1 S2 normal no gallop . Respiratory: No rhonchi no rales are noted at this time . Abdomen: soft . Skin: no rash seen on limited exam . Musculoskeletal: not rigid . Psychiatric:unable to assess . Neurologic: no seizure no involuntary movements         Lab Data:   Basic Metabolic Panel: Recent Labs  Lab 03/01/19 0554 03/02/19 0635 03/03/19 0450 03/04/19 0516 03/06/19 0635  NA 144 143 139 143 140  K 4.7 4.6 4.5 4.4 4.7  CL 108 108 107 108 106  CO2 26 25 26 25 22   GLUCOSE 143* 147* 120* 138* 145*  BUN 41* 45* 46* 46* 46*  CREATININE 1.21 1.13 1.07 1.02 1.08  CALCIUM 9.6 9.7 9.8 9.9 10.4*  MG 2.8* 2.6* 2.6* 2.4 2.5*  PHOS  --  3.2 3.3 3.5 3.7    ABG: No results for input(s): PHART, PCO2ART, PO2ART, HCO3, O2SAT in the last 168 hours.  Liver Function Tests: Recent Labs  Lab 03/02/19 0635 03/03/19 0450 03/04/19 0516 03/06/19 0635  ALBUMIN 2.4* 2.4* 2.4* 2.7*   No results for input(s): LIPASE, AMYLASE in the last 168 hours. No results for input(s): AMMONIA in the last 168 hours.  CBC: Recent Labs  Lab 03/01/19 0554 03/02/19 0635 03/03/19 0450 03/04/19 0516 03/06/19 0635  WBC 8.5 8.8  8.2 8.6 10.2  HGB 7.1* 6.8* 7.8* 8.0* 8.9*  HCT 24.4* 23.2* 25.7* 26.5* 29.0*  MCV 94.9 94.7 93.8 93.6 92.7  PLT 318 312 397 376 424*    Cardiac Enzymes: No results for input(s): CKTOTAL, CKMB, CKMBINDEX, TROPONINI in the last 168 hours.  BNP (last 3 results) No results for input(s): BNP in the last 8760 hours.  ProBNP (last 3 results) No results for input(s): PROBNP in the last 8760 hours.  Radiological Exams: No results found.  Assessment/Plan Active Problems:   Acute on chronic respiratory failure with hypoxia (HCC)   Lobar pneumonia, unspecified organism (HCC)   Chronic congestive heart failure (HCC)   Closed C7 fracture without spinal cord injury, sequela   Pulmonary embolism and infarction (HCC)   1. Acute on chronic respiratory failure hypoxia plan is to continue with T collar trials currently on 28% FiO2 2. Lobar pneumonia unchanged continue present management 3. Chronic congestive heart failure at baseline 4. C7 fracture no change 5. Pulmonary embolism treated continue to follow   I have personally seen and evaluated the patient, evaluated laboratory and imaging results, formulated the assessment and plan and placed orders. The Patient requires high complexity decision making with multiple systems involvement.  Rounds were done with the Respiratory Therapy Director and Staff therapists and discussed with nursing staff also.  03/08/19, MD [facility] Pulmonary Critical Care  Medicine Sleep Medicine

## 2019-03-07 DIAGNOSIS — J9621 Acute and chronic respiratory failure with hypoxia: Secondary | ICD-10-CM | POA: Diagnosis not present

## 2019-03-07 DIAGNOSIS — I509 Heart failure, unspecified: Secondary | ICD-10-CM | POA: Diagnosis not present

## 2019-03-07 DIAGNOSIS — J181 Lobar pneumonia, unspecified organism: Secondary | ICD-10-CM | POA: Diagnosis not present

## 2019-03-07 DIAGNOSIS — S12690S Other displaced fracture of seventh cervical vertebra, sequela: Secondary | ICD-10-CM | POA: Diagnosis not present

## 2019-03-07 NOTE — Progress Notes (Addendum)
Pulmonary Critical Care Medicine [facility] [place]   PULMONARY CRITICAL CARE SERVICE  PROGRESS NOTE  Date of Service: 03/07/2019  Raymond Avila  ZOX:[id number]  DOB: Jan 10, 1966   DOA: 12/23/2018  Referring Physician: [name], MD  HPI: Raymond Avila is a 54 y.o. male seen for follow up of Acute on Chronic Respiratory Failure.  He continues on 28% aerosol trach collar satting well using PMV with no difficulty.  Medications: Reviewed on Rounds  Physical Exam:  Vitals: Pulse 87 respirations 35 BP 137/73 O2 sat 100% temp 98.1  Ventilator Settings ATC 28%  . General: Comfortable at this time . Eyes: Grossly normal lids, irises & conjunctiva . ENT: grossly tongue is normal . Neck: no obvious mass . Cardiovascular: S1 S2 normal no gallop . Respiratory: No rales or rhonchi noted . Abdomen: soft . Skin: no rash seen on limited exam . Musculoskeletal: not rigid . Psychiatric:unable to assess . Neurologic: no seizure no involuntary movements         Lab Data:   Basic Metabolic Panel: Recent Labs  Lab 03/01/19 0554 03/02/19 0635 03/03/19 0450 03/04/19 0516 03/06/19 0635  NA 144 143 139 143 140  K 4.7 4.6 4.5 4.4 4.7  CL 108 108 107 108 106  CO2 26 25 26 25 22   GLUCOSE 143* 147* 120* 138* 145*  BUN 41* 45* 46* 46* 46*  CREATININE 1.21 1.13 1.07 1.02 1.08  CALCIUM 9.6 9.7 9.8 9.9 10.4*  MG 2.8* 2.6* 2.6* 2.4 2.5*  PHOS  --  3.2 3.3 3.5 3.7    ABG: No results for input(s): PHART, PCO2ART, PO2ART, HCO3, O2SAT in the last 168 hours.  Liver Function Tests: Recent Labs  Lab 03/02/19 0635 03/03/19 0450 03/04/19 0516 03/06/19 0635  ALBUMIN 2.4* 2.4* 2.4* 2.7*   No results for input(s): LIPASE, AMYLASE in the last 168 hours. No results for input(s): AMMONIA in the last 168 hours.  CBC: Recent Labs  Lab 03/01/19 0554 03/02/19 0635 03/03/19 0450 03/04/19 0516 03/06/19 0635  WBC 8.5 8.8 8.2 8.6 10.2  HGB 7.1* 6.8* 7.8* 8.0* 8.9*  HCT 24.4*  23.2* 25.7* 26.5* 29.0*  MCV 94.9 94.7 93.8 93.6 92.7  PLT 318 312 397 376 424*    Cardiac Enzymes: No results for input(s): CKTOTAL, CKMB, CKMBINDEX, TROPONINI in the last 168 hours.  BNP (last 3 results) No results for input(s): BNP in the last 8760 hours.  ProBNP (last 3 results) No results for input(s): PROBNP in the last 8760 hours.  Radiological Exams: No results found.  Assessment/Plan Active Problems:   Acute on chronic respiratory failure with hypoxia (HCC)   Lobar pneumonia, unspecified organism (HCC)   Chronic congestive heart failure (HCC)   Closed C7 fracture without spinal cord injury, sequela   Pulmonary embolism and infarction (HCC)   1. Acute on chronic respiratory failure hypoxia plan is to continue with T collar trials currently on 28% FiO2 2. Lobar pneumonia unchanged continue present management 3. Chronic congestive heart failure at baseline 4. C7 fracture no change 5. Pulmonary embolism treated continue to follow   I have personally seen and evaluated the patient, evaluated laboratory and imaging results, formulated the assessment and plan and placed orders. The Patient requires high complexity decision making with multiple systems involvement.  Rounds were done with the Respiratory Therapy Director and Staff therapists and discussed with nursing staff also.  03/08/19, MD [facility] Pulmonary Critical Care Medicine Sleep Medicine

## 2019-03-08 DIAGNOSIS — J9621 Acute and chronic respiratory failure with hypoxia: Secondary | ICD-10-CM | POA: Diagnosis not present

## 2019-03-08 DIAGNOSIS — S12690S Other displaced fracture of seventh cervical vertebra, sequela: Secondary | ICD-10-CM | POA: Diagnosis not present

## 2019-03-08 DIAGNOSIS — I509 Heart failure, unspecified: Secondary | ICD-10-CM | POA: Diagnosis not present

## 2019-03-08 DIAGNOSIS — J181 Lobar pneumonia, unspecified organism: Secondary | ICD-10-CM | POA: Diagnosis not present

## 2019-03-08 LAB — RENAL FUNCTION PANEL
Albumin: 2.6 g/dL — ABNORMAL LOW (ref 3.5–5.0)
Anion gap: 13 (ref 5–15)
BUN: 46 mg/dL — ABNORMAL HIGH (ref 6–20)
CO2: 22 mmol/L (ref 22–32)
Calcium: 9.8 mg/dL (ref 8.9–10.3)
Chloride: 109 mmol/L (ref 98–111)
Creatinine, Ser: 0.97 mg/dL (ref 0.61–1.24)
GFR calc Af Amer: >60 mL/min (ref >60)
GFR calc non Af Amer: >60 mL/min (ref >60)
Glucose, Bld: 134 mg/dL — ABNORMAL HIGH (ref 70–99)
Phosphorus: 4.9 mg/dL — ABNORMAL HIGH (ref 2.5–4.6)
Potassium: 4.1 mmol/L (ref 3.5–5.1)
Sodium: 144 mmol/L (ref 135–145)

## 2019-03-08 LAB — CBC
HCT: 28.3 % — ABNORMAL LOW (ref 39.0–52.0)
Hemoglobin: 8.8 g/dL — ABNORMAL LOW (ref 13.0–17.0)
MCH: 28.8 pg (ref 26.0–34.0)
MCHC: 31.1 g/dL (ref 30.0–36.0)
MCV: 92.5 fL (ref 80.0–100.0)
Platelets: 376 10*3/uL (ref 150–400)
RBC: 3.06 MIL/uL — ABNORMAL LOW (ref 4.22–5.81)
RDW: 17.5 % — ABNORMAL HIGH (ref 11.5–15.5)
WBC: 10 10*3/uL (ref 4.0–10.5)
nRBC: 0 % (ref 0.0–0.2)

## 2019-03-08 LAB — MAGNESIUM: Magnesium: 2.3 mg/dL (ref 1.7–2.4)

## 2019-03-08 NOTE — Progress Notes (Signed)
Pulmonary Critical Care Medicine [facility] [place]   PULMONARY CRITICAL CARE SERVICE  PROGRESS NOTE  Date of Service: 03/08/2019  Raymond Avila  YFV:[id number]  DOB: 1966-01-09   DOA: 12/23/2018  Referring Physician: [name], MD  HPI: Raymond Avila is a 54 y.o. male seen for follow up of Acute on Chronic Respiratory Failure.  Patient is on T collar currently on 28% FiO2 good saturations are noted  Medications: Reviewed on Rounds  Physical Exam:  Vitals: Temperature 99.8 pulse 90 respiratory 19 blood pressure is 123/71 saturations 100%  Ventilator Settings off the ventilator on T collar FiO2 28%  . General: Comfortable at this time . Eyes: Grossly normal lids, irises & conjunctiva . ENT: grossly tongue is normal . Neck: no obvious mass . Cardiovascular: S1 S2 normal no gallop . Respiratory: No rhonchi coarse breath sounds . Abdomen: soft . Skin: no rash seen on limited exam . Musculoskeletal: not rigid . Psychiatric:unable to assess . Neurologic: no seizure no involuntary movements         Lab Data:   Basic Metabolic Panel: Recent Labs  Lab 03/02/19 0635 03/03/19 0450 03/04/19 0516 03/06/19 0635 03/08/19 0529  NA 143 139 143 140 144  K 4.6 4.5 4.4 4.7 4.1  CL 108 107 108 106 109  CO2 25 26 25 22 22   GLUCOSE 147* 120* 138* 145* 134*  BUN 45* 46* 46* 46* 46*  CREATININE 1.13 1.07 1.02 1.08 0.97  CALCIUM 9.7 9.8 9.9 10.4* 9.8  MG 2.6* 2.6* 2.4 2.5* 2.3  PHOS 3.2 3.3 3.5 3.7 4.9*    ABG: No results for input(s): PHART, PCO2ART, PO2ART, HCO3, O2SAT in the last 168 hours.  Liver Function Tests: Recent Labs  Lab 03/02/19 0635 03/03/19 0450 03/04/19 0516 03/06/19 0635 03/08/19 0529  ALBUMIN 2.4* 2.4* 2.4* 2.7* 2.6*   No results for input(s): LIPASE, AMYLASE in the last 168 hours. No results for input(s): AMMONIA in the last 168 hours.  CBC: Recent Labs  Lab 03/02/19 0635 03/03/19 0450 03/04/19 0516 03/06/19 0635 03/08/19 0529   WBC 8.8 8.2 8.6 10.2 10.0  HGB 6.8* 7.8* 8.0* 8.9* 8.8*  HCT 23.2* 25.7* 26.5* 29.0* 28.3*  MCV 94.7 93.8 93.6 92.7 92.5  PLT 312 397 376 424* 376    Cardiac Enzymes: No results for input(s): CKTOTAL, CKMB, CKMBINDEX, TROPONINI in the last 168 hours.  BNP (last 3 results) No results for input(s): BNP in the last 8760 hours.  ProBNP (last 3 results) No results for input(s): PROBNP in the last 8760 hours.  Radiological Exams: No results found.  Assessment/Plan Active Problems:   Acute on chronic respiratory failure with hypoxia (HCC)   Lobar pneumonia, unspecified organism (HCC)   Chronic congestive heart failure (HCC)   Closed C7 fracture without spinal cord injury, sequela   Pulmonary embolism and infarction (HCC)   1. Acute on chronic respiratory failure hypoxia plan is to continue with T collar trials currently on 28% FiO2 good saturations 2. Lobar pneumonia treated improving slowly 3. Chronic congestive heart failure at baseline 4. C7 fracture no change 5. Pulmonary embolism treated we will continue to follow   I have personally seen and evaluated the patient, evaluated laboratory and imaging results, formulated the assessment and plan and placed orders. The Patient requires high complexity decision making with multiple systems involvement.  Rounds were done with the Respiratory Therapy Director and Staff therapists and discussed with nursing staff also.  03/10/19, MD [facility] Pulmonary Critical  Care Medicine Sleep Medicine

## 2019-03-09 DIAGNOSIS — J9621 Acute and chronic respiratory failure with hypoxia: Secondary | ICD-10-CM | POA: Diagnosis not present

## 2019-03-09 DIAGNOSIS — J181 Lobar pneumonia, unspecified organism: Secondary | ICD-10-CM | POA: Diagnosis not present

## 2019-03-09 DIAGNOSIS — I509 Heart failure, unspecified: Secondary | ICD-10-CM | POA: Diagnosis not present

## 2019-03-09 DIAGNOSIS — S12690S Other displaced fracture of seventh cervical vertebra, sequela: Secondary | ICD-10-CM | POA: Diagnosis not present

## 2019-03-09 NOTE — Progress Notes (Signed)
Pulmonary Critical Care Medicine [facility] [place]   PULMONARY CRITICAL CARE SERVICE  PROGRESS NOTE  Date of Service: 03/09/2019  [name]  [id number]  DOB: 28-Jul-1965   DOA: 12/23/2018  Referring Physician: [name], MD  HPI: Raymond Avila is a 54 y.o. male seen for follow up of Acute on Chronic Respiratory Failure.  Patient currently is on T collar on 28% FiO2 with good saturations  Medications: Reviewed on Rounds  Physical Exam:  Vitals: Temperature is 97.7 pulse 86 respiratory 20 blood pressure is 120/68 saturations 100%  Ventilator Settings off the ventilator on T collar FiO2 28%  . General: Comfortable at this time . Eyes: Grossly normal lids, irises & conjunctiva . ENT: grossly tongue is normal . Neck: no obvious mass . Cardiovascular: S1 S2 normal no gallop . Respiratory: No rhonchi coarse breath sounds . Abdomen: soft . Skin: no rash seen on limited exam . Musculoskeletal: not rigid . Psychiatric:unable to assess . Neurologic: no seizure no involuntary movements         Lab Data:   Basic Metabolic Panel: Recent Labs  Lab 03/03/19 0450 03/04/19 0516 03/06/19 0635 03/08/19 0529  NA 139 143 140 144  K 4.5 4.4 4.7 4.1  CL 107 108 106 109  CO2 26 25 22 22   GLUCOSE 120* 138* 145* 134*  BUN 46* 46* 46* 46*  CREATININE 1.07 1.02 1.08 0.97  CALCIUM 9.8 9.9 10.4* 9.8  MG 2.6* 2.4 2.5* 2.3  PHOS 3.3 3.5 3.7 4.9*    ABG: No results for input(s): PHART, PCO2ART, PO2ART, HCO3, O2SAT in the last 168 hours.  Liver Function Tests: Recent Labs  Lab 03/03/19 0450 03/04/19 0516 03/06/19 0635 03/08/19 0529  ALBUMIN 2.4* 2.4* 2.7* 2.6*   No results for input(s): LIPASE, AMYLASE in the last 168 hours. No results for input(s): AMMONIA in the last 168 hours.  CBC: Recent Labs  Lab 03/03/19 0450 03/04/19 0516 03/06/19 0635 03/08/19 0529  WBC 8.2 8.6 10.2 10.0  HGB 7.8* 8.0* 8.9* 8.8*  HCT 25.7* 26.5* 29.0* 28.3*  MCV 93.8  93.6 92.7 92.5  PLT 397 376 424* 376    Cardiac Enzymes: No results for input(s): CKTOTAL, CKMB, CKMBINDEX, TROPONINI in the last 168 hours.  BNP (last 3 results) No results for input(s): BNP in the last 8760 hours.  ProBNP (last 3 results) No results for input(s): PROBNP in the last 8760 hours.  Radiological Exams: No results found.  Assessment/Plan Active Problems:   Acute on chronic respiratory failure with hypoxia (HCC)   Lobar pneumonia, unspecified organism (HCC)   Chronic congestive heart failure (HCC)   Closed C7 fracture without spinal cord injury, sequela   Pulmonary embolism and infarction (HCC)   1. Acute on chronic respiratory failure hypoxia plan is to continue with T collar trials patient is at baseline secretions are still significant so needs ongoing aggressive pulmonary toilet 2. Lobar pneumonia treated clinically is improved we will continue to follow 3. Chronic congestive heart failure right now is compensated 4. C7 fracture at baseline 5. Pulmonary embolism treated   I have personally seen and evaluated the patient, evaluated laboratory and imaging results, formulated the assessment and plan and placed orders. The Patient requires high complexity decision making with multiple systems involvement.  Rounds were done with the Respiratory Therapy Director and Staff therapists and discussed with nursing staff also.  03/10/19, MD [facility] Pulmonary Critical Care Medicine Sleep Medicine

## 2019-03-10 DIAGNOSIS — S12690S Other displaced fracture of seventh cervical vertebra, sequela: Secondary | ICD-10-CM | POA: Diagnosis not present

## 2019-03-10 DIAGNOSIS — J181 Lobar pneumonia, unspecified organism: Secondary | ICD-10-CM | POA: Diagnosis not present

## 2019-03-10 DIAGNOSIS — I509 Heart failure, unspecified: Secondary | ICD-10-CM | POA: Diagnosis not present

## 2019-03-10 DIAGNOSIS — J9621 Acute and chronic respiratory failure with hypoxia: Secondary | ICD-10-CM | POA: Diagnosis not present

## 2019-03-10 LAB — URINALYSIS, ROUTINE W REFLEX MICROSCOPIC
Bacteria, UA: NONE SEEN
Bilirubin Urine: NEGATIVE
Glucose, UA: NEGATIVE mg/dL
Hgb urine dipstick: NEGATIVE
Ketones, ur: NEGATIVE mg/dL
Nitrite: NEGATIVE
Protein, ur: NEGATIVE mg/dL
Specific Gravity, Urine: 1.013 (ref 1.005–1.030)
pH: 8 (ref 5.0–8.0)

## 2019-03-10 LAB — BASIC METABOLIC PANEL
Anion gap: 10 (ref 5–15)
BUN: 44 mg/dL — ABNORMAL HIGH (ref 6–20)
CO2: 24 mmol/L (ref 22–32)
Calcium: 9.7 mg/dL (ref 8.9–10.3)
Chloride: 109 mmol/L (ref 98–111)
Creatinine, Ser: 0.63 mg/dL (ref 0.61–1.24)
GFR calc Af Amer: >60 mL/min (ref >60)
GFR calc non Af Amer: >60 mL/min (ref >60)
Glucose, Bld: 161 mg/dL — ABNORMAL HIGH (ref 70–99)
Potassium: 3.8 mmol/L (ref 3.5–5.1)
Sodium: 143 mmol/L (ref 135–145)

## 2019-03-10 LAB — AMMONIA: Ammonia: 21 umol/L (ref 9–35)

## 2019-03-10 NOTE — Progress Notes (Signed)
Pulmonary Critical Care Medicine [facility] [place]   PULMONARY CRITICAL CARE SERVICE  PROGRESS NOTE  Date of Service: 03/10/2019  Raymond Avila  [id number]  DOB: [birth year]   DOA: 12/23/2018  Referring Physician: [name], MD  HPI: Raymond Avila is a 54 y.o. male seen for follow up of Acute on Chronic Respiratory Failure.  Patient currently is on T collar comfortable right now is on 28% FiO2 good saturations  Medications: Reviewed on Rounds  Physical Exam:  Vitals: Temperature 97.9 pulse 80 respiratory 19 blood pressure is 103/62 saturations 100%  Ventilator Settings on T collar with an FiO2 28%  . General: Comfortable at this time . Eyes: Grossly normal lids, irises & conjunctiva . ENT: grossly tongue is normal . Neck: no obvious mass . Cardiovascular: S1 S2 normal no gallop . Respiratory: No rhonchi no rales are noted at this time . Abdomen: soft . Skin: no rash seen on limited exam . Musculoskeletal: not rigid . Psychiatric:unable to assess . Neurologic: no seizure no involuntary movements         Lab Data:   Basic Metabolic Panel: Recent Labs  Lab 03/04/19 0516 03/06/19 0635 03/08/19 0529 03/10/19 0608  NA 143 140 144 143  K 4.4 4.7 4.1 3.8  CL 108 106 109 109  CO2 25 22 22 24   GLUCOSE 138* 145* 134* 161*  BUN 46* 46* 46* 44*  CREATININE 1.02 1.08 0.97 0.63  CALCIUM 9.9 10.4* 9.8 9.7  MG 2.4 2.5* 2.3  --   PHOS 3.5 3.7 4.9*  --     ABG: No results for input(s): PHART, PCO2ART, PO2ART, HCO3, O2SAT in the last 168 hours.  Liver Function Tests: Recent Labs  Lab 03/04/19 0516 03/06/19 0635 03/08/19 0529  ALBUMIN 2.4* 2.7* 2.6*   No results for input(s): LIPASE, AMYLASE in the last 168 hours. Recent Labs  Lab 03/10/19 0608  AMMONIA 21    CBC: Recent Labs  Lab 03/04/19 0516 03/06/19 0635 03/08/19 0529  WBC 8.6 10.2 10.0  HGB 8.0* 8.9* 8.8*  HCT 26.5* 29.0* 28.3*  MCV 93.6 92.7 92.5  PLT 376 424* 376     Cardiac Enzymes: No results for input(s): CKTOTAL, CKMB, CKMBINDEX, TROPONINI in the last 168 hours.  BNP (last 3 results) No results for input(s): BNP in the last 8760 hours.  ProBNP (last 3 results) No results for input(s): PROBNP in the last 8760 hours.  Radiological Exams: No results found.  Assessment/Plan Active Problems:   Acute on chronic respiratory failure with hypoxia (HCC)   Lobar pneumonia, unspecified organism (HCC)   Chronic congestive heart failure (HCC)   Closed C7 fracture without spinal cord injury, sequela   Pulmonary embolism and infarction (HCC)   1. Acute on chronic respiratory failure hypoxia continue T collar trials patient is on 20% FiO2 at baseline 2. Lobar pneumonia treated clinically is improved 3. Congestive heart failure at baseline 4. C7 fracture supportive care 5. Pulmonary embolism treated we will continue to follow along   I have personally seen and evaluated the patient, evaluated laboratory and imaging results, formulated the assessment and plan and placed orders. The Patient requires high complexity decision making with multiple systems involvement.  Rounds were done with the Respiratory Therapy Director and Staff therapists and discussed with nursing staff also.  03/10/19, MD [facility] Pulmonary Critical Care Medicine Sleep Medicine

## 2019-03-11 DIAGNOSIS — S12690S Other displaced fracture of seventh cervical vertebra, sequela: Secondary | ICD-10-CM | POA: Diagnosis not present

## 2019-03-11 DIAGNOSIS — J181 Lobar pneumonia, unspecified organism: Secondary | ICD-10-CM | POA: Diagnosis not present

## 2019-03-11 DIAGNOSIS — I509 Heart failure, unspecified: Secondary | ICD-10-CM | POA: Diagnosis not present

## 2019-03-11 DIAGNOSIS — J9621 Acute and chronic respiratory failure with hypoxia: Secondary | ICD-10-CM | POA: Diagnosis not present

## 2019-03-11 LAB — URINE CULTURE: Culture: <10000 — AB

## 2019-03-11 NOTE — Progress Notes (Signed)
Pulmonary Critical Care Medicine [facility] [place]   PULMONARY CRITICAL CARE SERVICE  PROGRESS NOTE  Date of Service: 03/11/2019  Raymond Avila  [id number]  DOB: Jan 11, 1966   DOA: 12/23/2018  Referring Physician: [name], MD  HPI: Raymond Avila is a 54 y.o. male seen for follow up of Acute on Chronic Respiratory Failure.  Patient currently is on T collar on 28% FiO2  Medications: Reviewed on Rounds  Physical Exam:  Vitals: Temperature is 97.9 pulse 86 respiratory 24 blood pressure is 136/79 saturations 100%  Ventilator Settings off the ventilator on T collar FiO2 28%  . General: Comfortable at this time . Eyes: Grossly normal lids, irises & conjunctiva . ENT: grossly tongue is normal . Neck: no obvious mass . Cardiovascular: S1 S2 normal no gallop . Respiratory: Scattered rhonchi expansion is equal . Abdomen: soft . Skin: no rash seen on limited exam . Musculoskeletal: not rigid . Psychiatric:unable to assess . Neurologic: no seizure no involuntary movements         Lab Data:   Basic Metabolic Panel: Recent Labs  Lab 03/06/19 0635 03/08/19 0529 03/10/19 0608  NA 140 144 143  K 4.7 4.1 3.8  CL 106 109 109  CO2 22 22 24   GLUCOSE 145* 134* 161*  BUN 46* 46* 44*  CREATININE 1.08 0.97 0.63  CALCIUM 10.4* 9.8 9.7  MG 2.5* 2.3  --   PHOS 3.7 4.9*  --     ABG: No results for input(s): PHART, PCO2ART, PO2ART, HCO3, O2SAT in the last 168 hours.  Liver Function Tests: Recent Labs  Lab 03/06/19 0635 03/08/19 0529  ALBUMIN 2.7* 2.6*   No results for input(s): LIPASE, AMYLASE in the last 168 hours. Recent Labs  Lab 03/10/19 0608  AMMONIA 21    CBC: Recent Labs  Lab 03/06/19 0635 03/08/19 0529  WBC 10.2 10.0  HGB 8.9* 8.8*  HCT 29.0* 28.3*  MCV 92.7 92.5  PLT 424* 376    Cardiac Enzymes: No results for input(s): CKTOTAL, CKMB, CKMBINDEX, TROPONINI in the last 168 hours.  BNP (last 3 results) No results for input(s): BNP  in the last 8760 hours.  ProBNP (last 3 results) No results for input(s): PROBNP in the last 8760 hours.  Radiological Exams: No results found.  Assessment/Plan Active Problems:   Acute on chronic respiratory failure with hypoxia (HCC)   Lobar pneumonia, unspecified organism (HCC)   Chronic congestive heart failure (HCC)   Closed C7 fracture without spinal cord injury, sequela   Pulmonary embolism and infarction (HCC)   1. Acute on chronic respiratory failure with hypoxia plan is to continue with T collar trials as tolerated patient remains on 20% FiO2 with good saturations 2. Lobar pneumonia treated clinically improving 3. Chronic congestive heart failure appears to be compensated 4. C7 fracture no change 5. Pulmonary embolism treated   I have personally seen and evaluated the patient, evaluated laboratory and imaging results, formulated the assessment and plan and placed orders. The Patient requires high complexity decision making with multiple systems involvement.  Rounds were done with the Respiratory Therapy Director and Staff therapists and discussed with nursing staff also.  03/10/19, MD [facility] Pulmonary Critical Care Medicine Sleep Medicine

## 2019-03-12 DIAGNOSIS — J9621 Acute and chronic respiratory failure with hypoxia: Secondary | ICD-10-CM | POA: Diagnosis not present

## 2019-03-12 DIAGNOSIS — I509 Heart failure, unspecified: Secondary | ICD-10-CM | POA: Diagnosis not present

## 2019-03-12 DIAGNOSIS — S12690S Other displaced fracture of seventh cervical vertebra, sequela: Secondary | ICD-10-CM | POA: Diagnosis not present

## 2019-03-12 DIAGNOSIS — J181 Lobar pneumonia, unspecified organism: Secondary | ICD-10-CM | POA: Diagnosis not present

## 2019-03-12 LAB — CBC
HCT: 27.3 % — ABNORMAL LOW (ref 39.0–52.0)
Hemoglobin: 8.1 g/dL — ABNORMAL LOW (ref 13.0–17.0)
MCH: 28.7 pg (ref 26.0–34.0)
MCHC: 29.7 g/dL — ABNORMAL LOW (ref 30.0–36.0)
MCV: 96.8 fL (ref 80.0–100.0)
Platelets: 303 10*3/uL (ref 150–400)
RBC: 2.82 MIL/uL — ABNORMAL LOW (ref 4.22–5.81)
RDW: 17.3 % — ABNORMAL HIGH (ref 11.5–15.5)
WBC: 8.2 10*3/uL (ref 4.0–10.5)
nRBC: 0 % (ref 0.0–0.2)

## 2019-03-12 LAB — BASIC METABOLIC PANEL
Anion gap: 8 (ref 5–15)
BUN: 45 mg/dL — ABNORMAL HIGH (ref 6–20)
CO2: 25 mmol/L (ref 22–32)
Calcium: 9.8 mg/dL (ref 8.9–10.3)
Chloride: 110 mmol/L (ref 98–111)
Creatinine, Ser: 0.66 mg/dL (ref 0.61–1.24)
GFR calc Af Amer: >60 mL/min (ref >60)
GFR calc non Af Amer: >60 mL/min (ref >60)
Glucose, Bld: 158 mg/dL — ABNORMAL HIGH (ref 70–99)
Potassium: 4.8 mmol/L (ref 3.5–5.1)
Sodium: 143 mmol/L (ref 135–145)

## 2019-03-12 LAB — MAGNESIUM: Magnesium: 2.2 mg/dL (ref 1.7–2.4)

## 2019-03-12 NOTE — Progress Notes (Signed)
Pulmonary Critical Care Medicine [facility] [place]   PULMONARY CRITICAL CARE SERVICE  PROGRESS NOTE  Date of Service: 03/12/2019  Raymond Avila  ZOX:[id number]  DOB: Nov 30, 1965   DOA: 12/23/2018  Referring Physician: [name], MD  HPI: Raymond Avila is a 54 y.o. male seen for follow up of Acute on Chronic Respiratory Failure.  Patient currently is on T collar has been on 28% FiO2 good saturations are noted at this time.  Medications: Reviewed on Rounds  Physical Exam:  Vitals: Temperature 98.0 pulse 93 respiratory 22 blood pressure is 124/64 saturations are 99%  Ventilator Settings on T collar currently on 20% FiO2  . General: Comfortable at this time . Eyes: Grossly normal lids, irises & conjunctiva . ENT: grossly tongue is normal . Neck: no obvious mass . Cardiovascular: S1 S2 normal no gallop . Respiratory: No rhonchi no rales are noted at this time . Abdomen: soft . Skin: no rash seen on limited exam . Musculoskeletal: not rigid . Psychiatric:unable to assess . Neurologic: no seizure no involuntary movements         Lab Data:   Basic Metabolic Panel: Recent Labs  Lab 03/06/19 0635 03/08/19 0529 03/10/19 0608 03/12/19 0622  NA 140 144 143 143  K 4.7 4.1 3.8 4.8  CL 106 109 109 110  CO2 22 22 24 25   GLUCOSE 145* 134* 161* 158*  BUN 46* 46* 44* 45*  CREATININE 1.08 0.97 0.63 0.66  CALCIUM 10.4* 9.8 9.7 9.8  MG 2.5* 2.3  --  2.2  PHOS 3.7 4.9*  --   --     ABG: No results for input(s): PHART, PCO2ART, PO2ART, HCO3, O2SAT in the last 168 hours.  Liver Function Tests: Recent Labs  Lab 03/06/19 0635 03/08/19 0529  ALBUMIN 2.7* 2.6*   No results for input(s): LIPASE, AMYLASE in the last 168 hours. Recent Labs  Lab 03/10/19 0608  AMMONIA 21    CBC: Recent Labs  Lab 03/06/19 0635 03/08/19 0529 03/12/19 0622  WBC 10.2 10.0 8.2  HGB 8.9* 8.8* 8.1*  HCT 29.0* 28.3* 27.3*  MCV 92.7 92.5 96.8  PLT 424* 376 303    Cardiac  Enzymes: No results for input(s): CKTOTAL, CKMB, CKMBINDEX, TROPONINI in the last 168 hours.  BNP (last 3 results) No results for input(s): BNP in the last 8760 hours.  ProBNP (last 3 results) No results for input(s): PROBNP in the last 8760 hours.  Radiological Exams: No results found.  Assessment/Plan Active Problems:   Acute on chronic respiratory failure with hypoxia (HCC)   Lobar pneumonia, unspecified organism (HCC)   Chronic congestive heart failure (HCC)   Closed C7 fracture without spinal cord injury, sequela   Pulmonary embolism and infarction (HCC)   1. Acute on chronic respiratory failure hypoxia plan is to continue with T collar trials patient currently is on 28% FiO2 good saturations 2. Lobar pneumonia treated we will continue present management 3. Chronic congestive heart failure compensated continue to follow the fluid status 4. C7 fracture status post repair of injury we will continue with supportive care 5. Pulmonary embolism treated we will continue to follow   I have personally seen and evaluated the patient, evaluated laboratory and imaging results, formulated the assessment and plan and placed orders. The Patient requires high complexity decision making with multiple systems involvement.  Rounds were done with the Respiratory Therapy Director and Staff therapists and discussed with nursing staff also.  03/14/19, MD [facility] Pulmonary Critical  Care Medicine Sleep Medicine

## 2019-03-13 DIAGNOSIS — I509 Heart failure, unspecified: Secondary | ICD-10-CM | POA: Diagnosis not present

## 2019-03-13 DIAGNOSIS — J9621 Acute and chronic respiratory failure with hypoxia: Secondary | ICD-10-CM | POA: Diagnosis not present

## 2019-03-13 DIAGNOSIS — J181 Lobar pneumonia, unspecified organism: Secondary | ICD-10-CM | POA: Diagnosis not present

## 2019-03-13 DIAGNOSIS — S12690S Other displaced fracture of seventh cervical vertebra, sequela: Secondary | ICD-10-CM | POA: Diagnosis not present

## 2019-03-13 NOTE — Progress Notes (Addendum)
Pulmonary Critical Care Medicine [facility] [place]   PULMONARY CRITICAL CARE SERVICE  PROGRESS NOTE  Date of Service: 03/13/2019  [name]  [id number]  DOB: 07/03/1965   DOA: 12/23/2018  Referring Physician: [name], MD  HPI: Raymond Avila is a 54 y.o. male seen for follow up of Acute on Chronic Respiratory Failure.  Patient remains on 20% aerosol trach collar using PMV with no difficulty satting well.  Medications: Reviewed on Rounds  Physical Exam:  Vitals: Pulse 81 respirations 18 BP 111/64 O2 sat 99% temp 97.6  Ventilator Settings ATC 28%  . General: Comfortable at this time . Eyes: Grossly normal lids, irises & conjunctiva . ENT: grossly tongue is normal . Neck: no obvious mass . Cardiovascular: S1 S2 normal no gallop . Respiratory: No rales or rhonchi noted . Abdomen: soft . Skin: no rash seen on limited exam . Musculoskeletal: not rigid . Psychiatric:unable to assess . Neurologic: no seizure no involuntary movements         Lab Data:   Basic Metabolic Panel: Recent Labs  Lab 03/08/19 0529 03/10/19 0608 03/12/19 0622  NA 144 143 143  K 4.1 3.8 4.8  CL 109 109 110  CO2 22 24 25   GLUCOSE 134* 161* 158*  BUN 46* 44* 45*  CREATININE 0.97 0.63 0.66  CALCIUM 9.8 9.7 9.8  MG 2.3  --  2.2  PHOS 4.9*  --   --     ABG: No results for input(s): PHART, PCO2ART, PO2ART, HCO3, O2SAT in the last 168 hours.  Liver Function Tests: Recent Labs  Lab 03/08/19 0529  ALBUMIN 2.6*   No results for input(s): LIPASE, AMYLASE in the last 168 hours. Recent Labs  Lab 03/10/19 0608  AMMONIA 21    CBC: Recent Labs  Lab 03/08/19 0529 03/12/19 0622  WBC 10.0 8.2  HGB 8.8* 8.1*  HCT 28.3* 27.3*  MCV 92.5 96.8  PLT 376 303    Cardiac Enzymes: No results for input(s): CKTOTAL, CKMB, CKMBINDEX, TROPONINI in the last 168 hours.  BNP (last 3 results) No results for input(s): BNP in the last 8760 hours.  ProBNP (last 3 results) No  results for input(s): PROBNP in the last 8760 hours.  Radiological Exams: No results found.  Assessment/Plan Active Problems:   Acute on chronic respiratory failure with hypoxia (HCC)   Lobar pneumonia, unspecified organism (HCC)   Chronic congestive heart failure (HCC)   Closed C7 fracture without spinal cord injury, sequela   Pulmonary embolism and infarction (HCC)   1. Acute on chronic respiratory failure hypoxia plan is to continue with T collar trials patient currently is on 28% FiO2 good saturations 2. Lobar pneumonia treated we will continue present management 3. Chronic congestive heart failure compensated continue to follow the fluid status 4. C7 fracture status post repair of injury we will continue with supportive care 5. Pulmonary embolism treated we will continue to follow   I have personally seen and evaluated the patient, evaluated laboratory and imaging results, formulated the assessment and plan and placed orders. The Patient requires high complexity decision making with multiple systems involvement.  Rounds were done with the Respiratory Therapy Director and Staff therapists and discussed with nursing staff also.  03/14/19, MD [facility] Pulmonary Critical Care Medicine Sleep Medicine

## 2019-03-14 DIAGNOSIS — J9621 Acute and chronic respiratory failure with hypoxia: Secondary | ICD-10-CM | POA: Diagnosis not present

## 2019-03-14 DIAGNOSIS — S12690S Other displaced fracture of seventh cervical vertebra, sequela: Secondary | ICD-10-CM | POA: Diagnosis not present

## 2019-03-14 DIAGNOSIS — J181 Lobar pneumonia, unspecified organism: Secondary | ICD-10-CM | POA: Diagnosis not present

## 2019-03-14 DIAGNOSIS — I509 Heart failure, unspecified: Secondary | ICD-10-CM | POA: Diagnosis not present

## 2019-03-14 NOTE — Progress Notes (Addendum)
Pulmonary Critical Care Medicine [facility] [place]   PULMONARY CRITICAL CARE SERVICE  PROGRESS NOTE  Date of Service: 03/14/2019  RULON ABDALLA  OIN:[id number]  DOB: 1965-10-03   DOA: 12/23/2018  Referring Physician: [name], MD  HPI: Raymond Avila is a 54 y.o. male seen for follow up of Acute on Chronic Respiratory Failure.  Patient is on 20% aerosol trach collar satting well no fever or distress.  Medications: Reviewed on Rounds  Physical Exam:  Vitals: Pulse 97 respirations 30 BP 102/46 O2 sat 100% temp 98.9  Ventilator Settings 28% ATC  . General: Comfortable at this time . Eyes: Grossly normal lids, irises & conjunctiva . ENT: grossly tongue is normal . Neck: no obvious mass . Cardiovascular: S1 S2 normal no gallop . Respiratory: No rales or rhonchi noted . Abdomen: soft . Skin: no rash seen on limited exam . Musculoskeletal: not rigid . Psychiatric:unable to assess . Neurologic: no seizure no involuntary movements         Lab Data:   Basic Metabolic Panel: Recent Labs  Lab 03/08/19 0529 03/10/19 0608 03/12/19 0622  NA 144 143 143  K 4.1 3.8 4.8  CL 109 109 110  CO2 22 24 25   GLUCOSE 134* 161* 158*  BUN 46* 44* 45*  CREATININE 0.97 0.63 0.66  CALCIUM 9.8 9.7 9.8  MG 2.3  --  2.2  PHOS 4.9*  --   --     ABG: No results for input(s): PHART, PCO2ART, PO2ART, HCO3, O2SAT in the last 168 hours.  Liver Function Tests: Recent Labs  Lab 03/08/19 0529  ALBUMIN 2.6*   No results for input(s): LIPASE, AMYLASE in the last 168 hours. Recent Labs  Lab 03/10/19 0608  AMMONIA 21    CBC: Recent Labs  Lab 03/08/19 0529 03/12/19 0622  WBC 10.0 8.2  HGB 8.8* 8.1*  HCT 28.3* 27.3*  MCV 92.5 96.8  PLT 376 303    Cardiac Enzymes: No results for input(s): CKTOTAL, CKMB, CKMBINDEX, TROPONINI in the last 168 hours.  BNP (last 3 results) No results for input(s): BNP in the last 8760 hours.  ProBNP (last 3 results) No results for  input(s): PROBNP in the last 8760 hours.  Radiological Exams: No results found.  Assessment/Plan Active Problems:   Acute on chronic respiratory failure with hypoxia (HCC)   Lobar pneumonia, unspecified organism (HCC)   Chronic congestive heart failure (HCC)   Closed C7 fracture without spinal cord injury, sequela   Pulmonary embolism and infarction (HCC)   1. Acute on chronic respiratory failure hypoxia plan is to continue with T collar trials patient currently is on 28% FiO2 good saturations 2. Lobar pneumonia treated we will continue present management 3. Chronic congestive heart failure compensated continue to follow the fluid status 4. C7 fracture status post repair of injury we will continue with supportive care 5. Pulmonary embolism treated we will continue to follow   I have personally seen and evaluated the patient, evaluated laboratory and imaging results, formulated the assessment and plan and placed orders. The Patient requires high complexity decision making with multiple systems involvement.  Rounds were done with the Respiratory Therapy Director and Staff therapists and discussed with nursing staff also.  03/14/19, MD [facility] Pulmonary Critical Care Medicine Sleep Medicine

## 2019-03-15 DIAGNOSIS — J9621 Acute and chronic respiratory failure with hypoxia: Secondary | ICD-10-CM | POA: Diagnosis not present

## 2019-03-15 DIAGNOSIS — J181 Lobar pneumonia, unspecified organism: Secondary | ICD-10-CM | POA: Diagnosis not present

## 2019-03-15 DIAGNOSIS — I509 Heart failure, unspecified: Secondary | ICD-10-CM | POA: Diagnosis not present

## 2019-03-15 DIAGNOSIS — S12690S Other displaced fracture of seventh cervical vertebra, sequela: Secondary | ICD-10-CM | POA: Diagnosis not present

## 2019-03-15 LAB — BASIC METABOLIC PANEL
Anion gap: 10 (ref 5–15)
BUN: 41 mg/dL — ABNORMAL HIGH (ref 6–20)
CO2: 23 mmol/L (ref 22–32)
Calcium: 9.9 mg/dL (ref 8.9–10.3)
Chloride: 112 mmol/L — ABNORMAL HIGH (ref 98–111)
Creatinine, Ser: 0.58 mg/dL — ABNORMAL LOW (ref 0.61–1.24)
GFR calc Af Amer: >60 mL/min (ref >60)
GFR calc non Af Amer: >60 mL/min (ref >60)
Glucose, Bld: 159 mg/dL — ABNORMAL HIGH (ref 70–99)
Potassium: 4.6 mmol/L (ref 3.5–5.1)
Sodium: 145 mmol/L (ref 135–145)

## 2019-03-15 LAB — CBC
HCT: 27.1 % — ABNORMAL LOW (ref 39.0–52.0)
Hemoglobin: 8.1 g/dL — ABNORMAL LOW (ref 13.0–17.0)
MCH: 28.6 pg (ref 26.0–34.0)
MCHC: 29.9 g/dL — ABNORMAL LOW (ref 30.0–36.0)
MCV: 95.8 fL (ref 80.0–100.0)
Platelets: 268 10*3/uL (ref 150–400)
RBC: 2.83 MIL/uL — ABNORMAL LOW (ref 4.22–5.81)
RDW: 17.6 % — ABNORMAL HIGH (ref 11.5–15.5)
WBC: 9 10*3/uL (ref 4.0–10.5)
nRBC: 0 % (ref 0.0–0.2)

## 2019-03-15 LAB — URINALYSIS, ROUTINE W REFLEX MICROSCOPIC
Bilirubin Urine: NEGATIVE
Glucose, UA: NEGATIVE mg/dL
Ketones, ur: NEGATIVE mg/dL
Nitrite: NEGATIVE
Protein, ur: 30 mg/dL — AB
Specific Gravity, Urine: 1.018 (ref 1.005–1.030)
WBC, UA: >50 WBC/hpf — ABNORMAL HIGH (ref 0–5)
pH: 5 (ref 5.0–8.0)

## 2019-03-15 LAB — MAGNESIUM: Magnesium: 2 mg/dL (ref 1.7–2.4)

## 2019-03-15 NOTE — Progress Notes (Signed)
Pulmonary Critical Care Medicine [facility] [place]   PULMONARY CRITICAL CARE SERVICE  PROGRESS NOTE  Date of Service: 03/15/2019  Raymond Avila  DPO:[id number]  DOB: August 21, 1965   DOA: 12/23/2018  Referring Physician: [name], MD  HPI: RUE TINNEL is a 54 y.o. male seen for follow up of Acute on Chronic Respiratory Failure.  Patient is on T collar right now on 28% FiO2 using the PMV is at his baseline  Medications: Reviewed on Rounds  Physical Exam:  Vitals: Temperature 98.9 pulse 98 respiratory rate 22 blood pressure is 129/73 saturations 97%  Ventilator Settings off the ventilator on T collar FiO2 28% using PMV  . General: Comfortable at this time . Eyes: Grossly normal lids, irises & conjunctiva . ENT: grossly tongue is normal . Neck: no obvious mass . Cardiovascular: S1 S2 normal no gallop . Respiratory: No rhonchi no rales are noted at this time . Abdomen: soft . Skin: no rash seen on limited exam . Musculoskeletal: not rigid . Psychiatric:unable to assess . Neurologic: no seizure no involuntary movements         Lab Data:   Basic Metabolic Panel: Recent Labs  Lab 03/10/19 0608 03/12/19 0622 03/15/19 0603  NA 143 143 145  K 3.8 4.8 4.6  CL 109 110 112*  CO2 24 25 23   GLUCOSE 161* 158* 159*  BUN 44* 45* 41*  CREATININE 0.63 0.66 0.58*  CALCIUM 9.7 9.8 9.9  MG  --  2.2 2.0    ABG: No results for input(s): PHART, PCO2ART, PO2ART, HCO3, O2SAT in the last 168 hours.  Liver Function Tests: No results for input(s): AST, ALT, ALKPHOS, BILITOT, PROT, ALBUMIN in the last 168 hours. No results for input(s): LIPASE, AMYLASE in the last 168 hours. Recent Labs  Lab 03/10/19 0608  AMMONIA 21    CBC: Recent Labs  Lab 03/12/19 0622 03/15/19 0603  WBC 8.2 9.0  HGB 8.1* 8.1*  HCT 27.3* 27.1*  MCV 96.8 95.8  PLT 303 268    Cardiac Enzymes: No results for input(s): CKTOTAL, CKMB, CKMBINDEX, TROPONINI in the last 168 hours.  BNP  (last 3 results) No results for input(s): BNP in the last 8760 hours.  ProBNP (last 3 results) No results for input(s): PROBNP in the last 8760 hours.  Radiological Exams: No results found.  Assessment/Plan Active Problems:   Acute on chronic respiratory failure with hypoxia (HCC)   Lobar pneumonia, unspecified organism (HCC)   Chronic congestive heart failure (HCC)   Closed C7 fracture without spinal cord injury, sequela   Pulmonary embolism and infarction (HCC)   1. Acute on chronic respiratory failure hypoxia plan is to continue with T collar trials on 28% FiO2 patient is at baseline 2. Lobar pneumonia treated we will continue to follow 3. Chronic congestive heart failure at baseline 4. C7 fracture no change 5. Pulmonary embolism treated we will continue to follow   I have personally seen and evaluated the patient, evaluated laboratory and imaging results, formulated the assessment and plan and placed orders. The Patient requires high complexity decision making with multiple systems involvement.  Rounds were done with the Respiratory Therapy Director and Staff therapists and discussed with nursing staff also.  05/13/19, MD [facility] Pulmonary Critical Care Medicine Sleep Medicine

## 2019-03-16 DIAGNOSIS — I509 Heart failure, unspecified: Secondary | ICD-10-CM | POA: Diagnosis not present

## 2019-03-16 DIAGNOSIS — S12690S Other displaced fracture of seventh cervical vertebra, sequela: Secondary | ICD-10-CM | POA: Diagnosis not present

## 2019-03-16 DIAGNOSIS — J9621 Acute and chronic respiratory failure with hypoxia: Secondary | ICD-10-CM | POA: Diagnosis not present

## 2019-03-16 DIAGNOSIS — J181 Lobar pneumonia, unspecified organism: Secondary | ICD-10-CM | POA: Diagnosis not present

## 2019-03-16 LAB — URINE CULTURE: Culture: NO GROWTH

## 2019-03-16 NOTE — Progress Notes (Signed)
Pulmonary Critical Care Medicine [facility] [place]   PULMONARY CRITICAL CARE SERVICE  PROGRESS NOTE  Date of Service: 03/16/2019  Raymond Avila  ACZ:[id number]  DOB: [birth year]   DOA: 12/23/2018  Referring Physician: [name], MD  HPI: Raymond Avila is a 54 y.o. male seen for follow up of Acute on Chronic Respiratory Failure.  Patient currently is on T collar has been on 28% FiO2 with good saturations  Medications: Reviewed on Rounds  Physical Exam:  Vitals: Temperature 98.6 pulse 85 respiratory rate 18 blood pressure is 113/59 saturations 100%  Ventilator Settings on T collar with an FiO2 of 28%  . General: Comfortable at this time . Eyes: Grossly normal lids, irises & conjunctiva . ENT: grossly tongue is normal . Neck: no obvious mass . Cardiovascular: S1 S2 normal no gallop . Respiratory: No rhonchi no rales are noted at this time . Abdomen: soft . Skin: no rash seen on limited exam . Musculoskeletal: not rigid . Psychiatric:unable to assess . Neurologic: no seizure no involuntary movements         Lab Data:   Basic Metabolic Panel: Recent Labs  Lab 03/10/19 0608 03/12/19 0622 03/15/19 0603  NA 143 143 145  K 3.8 4.8 4.6  CL 109 110 112*  CO2 24 25 23   GLUCOSE 161* 158* 159*  BUN 44* 45* 41*  CREATININE 0.63 0.66 0.58*  CALCIUM 9.7 9.8 9.9  MG  --  2.2 2.0    ABG: No results for input(s): PHART, PCO2ART, PO2ART, HCO3, O2SAT in the last 168 hours.  Liver Function Tests: No results for input(s): AST, ALT, ALKPHOS, BILITOT, PROT, ALBUMIN in the last 168 hours. No results for input(s): LIPASE, AMYLASE in the last 168 hours. Recent Labs  Lab 03/10/19 0608  AMMONIA 21    CBC: Recent Labs  Lab 03/12/19 0622 03/15/19 0603  WBC 8.2 9.0  HGB 8.1* 8.1*  HCT 27.3* 27.1*  MCV 96.8 95.8  PLT 303 268    Cardiac Enzymes: No results for input(s): CKTOTAL, CKMB, CKMBINDEX, TROPONINI in the last 168 hours.  BNP (last 3 results) No  results for input(s): BNP in the last 8760 hours.  ProBNP (last 3 results) No results for input(s): PROBNP in the last 8760 hours.  Radiological Exams: No results found.  Assessment/Plan Active Problems:   Acute on chronic respiratory failure with hypoxia (HCC)   Lobar pneumonia, unspecified organism (HCC)   Chronic congestive heart failure (HCC)   Closed C7 fracture without spinal cord injury, sequela   Pulmonary embolism and infarction (HCC)   1. Acute on chronic respiratory failure with hypoxia plan is to continue with T collar trials patient is on 28% FiO2 using PMV 2. Lobar pneumonia treated resolved 3. Chronic congestive heart failure compensated 4. C7 fracture no change 5. Pulmonary embolism we will continue with supportive care   I have personally seen and evaluated the patient, evaluated laboratory and imaging results, formulated the assessment and plan and placed orders. The Patient requires high complexity decision making with multiple systems involvement.  Rounds were done with the Respiratory Therapy Director and Staff therapists and discussed with nursing staff also.  05/13/19, MD [facility] Pulmonary Critical Care Medicine Sleep Medicine

## 2019-03-17 DIAGNOSIS — I509 Heart failure, unspecified: Secondary | ICD-10-CM | POA: Diagnosis not present

## 2019-03-17 DIAGNOSIS — S12690S Other displaced fracture of seventh cervical vertebra, sequela: Secondary | ICD-10-CM | POA: Diagnosis not present

## 2019-03-17 DIAGNOSIS — J181 Lobar pneumonia, unspecified organism: Secondary | ICD-10-CM | POA: Diagnosis not present

## 2019-03-17 DIAGNOSIS — J9621 Acute and chronic respiratory failure with hypoxia: Secondary | ICD-10-CM | POA: Diagnosis not present

## 2019-03-17 LAB — CULTURE, RESPIRATORY W GRAM STAIN: Culture: NORMAL

## 2019-03-17 LAB — RENAL FUNCTION PANEL
Albumin: 2.5 g/dL — ABNORMAL LOW (ref 3.5–5.0)
Anion gap: 14 (ref 5–15)
BUN: 30 mg/dL — ABNORMAL HIGH (ref 6–20)
CO2: 24 mmol/L (ref 22–32)
Calcium: 10 mg/dL (ref 8.9–10.3)
Chloride: 103 mmol/L (ref 98–111)
Creatinine, Ser: 0.53 mg/dL — ABNORMAL LOW (ref 0.61–1.24)
GFR calc Af Amer: >60 mL/min (ref >60)
GFR calc non Af Amer: >60 mL/min (ref >60)
Glucose, Bld: 158 mg/dL — ABNORMAL HIGH (ref 70–99)
Phosphorus: 2.8 mg/dL (ref 2.5–4.6)
Potassium: 3.9 mmol/L (ref 3.5–5.1)
Sodium: 141 mmol/L (ref 135–145)

## 2019-03-17 LAB — CBC
HCT: 24.1 % — ABNORMAL LOW (ref 39.0–52.0)
Hemoglobin: 7.5 g/dL — ABNORMAL LOW (ref 13.0–17.0)
MCH: 29.1 pg (ref 26.0–34.0)
MCHC: 31.1 g/dL (ref 30.0–36.0)
MCV: 93.4 fL (ref 80.0–100.0)
Platelets: 300 10*3/uL (ref 150–400)
RBC: 2.58 MIL/uL — ABNORMAL LOW (ref 4.22–5.81)
RDW: 17.1 % — ABNORMAL HIGH (ref 11.5–15.5)
WBC: 9.8 10*3/uL (ref 4.0–10.5)
nRBC: 0 % (ref 0.0–0.2)

## 2019-03-17 LAB — MAGNESIUM: Magnesium: 1.8 mg/dL (ref 1.7–2.4)

## 2019-03-17 NOTE — Progress Notes (Signed)
Pulmonary Critical Care Medicine [facility] [place]   PULMONARY CRITICAL CARE SERVICE  PROGRESS NOTE  Date of Service: 03/17/2019  Raymond Avila  YKD:[id number]  DOB: Jan 26, 1966   DOA: 12/23/2018  Referring Physician: [name], MD  HPI: Raymond Avila is a 54 y.o. male seen for follow up of Acute on Chronic Respiratory Failure.  Patient currently is on T collar has been on 28% FiO2 good saturations are noted at this time  Medications: Reviewed on Rounds  Physical Exam:  Vitals: Temperature is 98.2 pulse 54 respiratory rate 30 blood pressure is 130/84 saturations 100%  Ventilator Settings on T collar with an FiO2 of 28%  . General: Comfortable at this time . Eyes: Grossly normal lids, irises & conjunctiva . ENT: grossly tongue is normal . Neck: no obvious mass . Cardiovascular: S1 S2 normal no gallop . Respiratory: No rhonchi no rales are noted at this time . Abdomen: soft . Skin: no rash seen on limited exam . Musculoskeletal: not rigid . Psychiatric:unable to assess . Neurologic: no seizure no involuntary movements         Lab Data:   Basic Metabolic Panel: Recent Labs  Lab 03/12/19 0622 03/15/19 0603 03/17/19 0652  NA 143 145 141  K 4.8 4.6 3.9  CL 110 112* 103  CO2 25 23 24   GLUCOSE 158* 159* 158*  BUN 45* 41* 30*  CREATININE 0.66 0.58* 0.53*  CALCIUM 9.8 9.9 10.0  MG 2.2 2.0 1.8  PHOS  --   --  2.8    ABG: No results for input(s): PHART, PCO2ART, PO2ART, HCO3, O2SAT in the last 168 hours.  Liver Function Tests: Recent Labs  Lab 03/17/19 0652  ALBUMIN 2.5*   No results for input(s): LIPASE, AMYLASE in the last 168 hours. No results for input(s): AMMONIA in the last 168 hours.  CBC: Recent Labs  Lab 03/12/19 0622 03/15/19 0603 03/17/19 0652  WBC 8.2 9.0 9.8  HGB 8.1* 8.1* 7.5*  HCT 27.3* 27.1* 24.1*  MCV 96.8 95.8 93.4  PLT 303 268 300    Cardiac Enzymes: No results for input(s): CKTOTAL, CKMB, CKMBINDEX, TROPONINI in  the last 168 hours.  BNP (last 3 results) No results for input(s): BNP in the last 8760 hours.  ProBNP (last 3 results) No results for input(s): PROBNP in the last 8760 hours.  Radiological Exams: No results found.  Assessment/Plan Active Problems:   Acute on chronic respiratory failure with hypoxia (HCC)   Lobar pneumonia, unspecified organism (HCC)   Chronic congestive heart failure (HCC)   Closed C7 fracture without spinal cord injury, sequela   Pulmonary embolism and infarction (HCC)   1. Acute on chronic respiratory failure with hypoxia plan is to continue with T collar patient is on 20% FiO2 good saturations 2. Low vitamin D treated resolved 3. Chronic congestive heart failure compensated 4. C7 fracture at baseline 5. Pulmonary embolism treated   I have personally seen and evaluated the patient, evaluated laboratory and imaging results, formulated the assessment and plan and placed orders. The Patient requires high complexity decision making with multiple systems involvement.  Rounds were done with the Respiratory Therapy Director and Staff therapists and discussed with nursing staff also.  05/15/19, MD [facility] Pulmonary Critical Care Medicine Sleep Medicine

## 2019-03-18 DIAGNOSIS — I509 Heart failure, unspecified: Secondary | ICD-10-CM | POA: Diagnosis not present

## 2019-03-18 DIAGNOSIS — S12690S Other displaced fracture of seventh cervical vertebra, sequela: Secondary | ICD-10-CM | POA: Diagnosis not present

## 2019-03-18 DIAGNOSIS — J9621 Acute and chronic respiratory failure with hypoxia: Secondary | ICD-10-CM | POA: Diagnosis not present

## 2019-03-18 DIAGNOSIS — J181 Lobar pneumonia, unspecified organism: Secondary | ICD-10-CM | POA: Diagnosis not present

## 2019-03-18 LAB — FUNGUS CULTURE WITH STAIN

## 2019-03-18 LAB — SARS CORONAVIRUS 2 (TAT 6-24 HRS): SARS Coronavirus 2: NEGATIVE

## 2019-03-18 LAB — FUNGUS CULTURE RESULT

## 2019-03-18 LAB — FUNGAL ORGANISM REFLEX

## 2019-03-18 NOTE — Progress Notes (Signed)
Pulmonary Critical Care Medicine [name]   PULMONARY CRITICAL CARE SERVICE  PROGRESS NOTE  Date of Service: 03/18/2019  MCKADE [name]  [name]:[id number]  DOB: April 12, 1965   DOA: 12/23/2018  Referring Physician: [name], MD  HPI: Raymond Avila is a 54 y.o. male seen for follow up of Acute on Chronic Respiratory Failure.  At this time patient is on T collar without any distress right now is comfortable  Medications: Reviewed on Rounds  Physical Exam:  Vitals: Temperature is 97.3 pulse 84 respiratory 20 blood pressure is 140/73 saturations 100%  Ventilator Settings on T collar currently 28% FiO2  . General: Comfortable at this time . Eyes: Grossly normal lids, irises & conjunctiva . ENT: grossly tongue is normal . Neck: no obvious mass . Cardiovascular: S1 S2 normal no gallop . Respiratory: No rhonchi no rales are noted . Abdomen: soft . Skin: no rash seen on limited exam . Musculoskeletal: not rigid . Psychiatric:unable to assess . Neurologic: no seizure no involuntary movements         Lab Data:   Basic Metabolic Panel: Recent Labs  Lab 03/12/19 0622 03/15/19 0603 03/17/19 0652  NA 143 145 141  K 4.8 4.6 3.9  CL 110 112* 103  CO2 25 23 24   GLUCOSE 158* 159* 158*  BUN 45* 41* 30*  CREATININE 0.66 0.58* 0.53*  CALCIUM 9.8 9.9 10.0  MG 2.2 2.0 1.8  PHOS  --   --  2.8    ABG: No results for input(s): PHART, PCO2ART, PO2ART, HCO3, O2SAT in the last 168 hours.  Liver Function Tests: Recent Labs  Lab 03/17/19 0652  ALBUMIN 2.5*   No results for input(s): LIPASE, AMYLASE in the last 168 hours. No results for input(s): AMMONIA in the last 168 hours.  CBC: Recent Labs  Lab 03/12/19 0622 03/15/19 0603 03/17/19 0652  WBC 8.2 9.0 9.8  HGB 8.1* 8.1* 7.5*  HCT 27.3* 27.1* 24.1*  MCV 96.8 95.8 93.4  PLT 303 268 300    Cardiac Enzymes: No results for input(s): CKTOTAL, CKMB, CKMBINDEX, TROPONINI in the last 168 hours.  BNP  (last 3 results) No results for input(s): BNP in the last 8760 hours.  ProBNP (last 3 results) No results for input(s): PROBNP in the last 8760 hours.  Radiological Exams: No results found.  Assessment/Plan Active Problems:   Acute on chronic respiratory failure with hypoxia (HCC)   Lobar pneumonia, unspecified organism (HCC)   Chronic congestive heart failure (HCC)   Closed C7 fracture without spinal cord injury, sequela   Pulmonary embolism and infarction (HCC)   1. Chronic respiratory failure hypoxia plan continue with T collar trials as tolerated continue secretion management supportive care 2. Lobar pneumonia basically unchanged treated 3. Chronic congestive heart failure baseline 4. C7 fracture no change 5. Pulmonary embolism treated we will continue to monitor   I have personally seen and evaluated the patient, evaluated laboratory and imaging results, formulated the assessment and plan and placed orders. The Patient requires high complexity decision making with multiple systems involvement.  Rounds were done with the Respiratory Therapy Director and Staff therapists and discussed with nursing staff also.  05/15/19, MD [facility] Pulmonary Critical Care Medicine Sleep Medicine

## 2019-03-19 LAB — CBC
HCT: 24 % — ABNORMAL LOW (ref 39.0–52.0)
Hemoglobin: 7.5 g/dL — ABNORMAL LOW (ref 13.0–17.0)
MCH: 29.5 pg (ref 26.0–34.0)
MCHC: 31.3 g/dL (ref 30.0–36.0)
MCV: 94.5 fL (ref 80.0–100.0)
Platelets: 241 10*3/uL (ref 150–400)
RBC: 2.54 MIL/uL — ABNORMAL LOW (ref 4.22–5.81)
RDW: 18 % — ABNORMAL HIGH (ref 11.5–15.5)
WBC: 8.6 10*3/uL (ref 4.0–10.5)
nRBC: 0 % (ref 0.0–0.2)

## 2019-03-19 LAB — BASIC METABOLIC PANEL
Anion gap: 8 (ref 5–15)
BUN: 32 mg/dL — ABNORMAL HIGH (ref 6–20)
CO2: 25 mmol/L (ref 22–32)
Calcium: 9.7 mg/dL (ref 8.9–10.3)
Chloride: 108 mmol/L (ref 98–111)
Creatinine, Ser: 0.49 mg/dL — ABNORMAL LOW (ref 0.61–1.24)
GFR calc Af Amer: >60 mL/min (ref >60)
GFR calc non Af Amer: >60 mL/min (ref >60)
Glucose, Bld: 160 mg/dL — ABNORMAL HIGH (ref 70–99)
Potassium: 4 mmol/L (ref 3.5–5.1)
Sodium: 141 mmol/L (ref 135–145)

## 2019-03-19 LAB — MAGNESIUM: Magnesium: 1.9 mg/dL (ref 1.7–2.4)

## 2019-03-19 LAB — PHOSPHORUS: Phosphorus: 4.4 mg/dL (ref 2.5–4.6)

## 2019-03-20 LAB — CULTURE, BLOOD (ROUTINE X 2)
Culture: NO GROWTH
Culture: NO GROWTH

## 2019-04-01 LAB — ACID FAST CULTURE WITH REFLEXED SENSITIVITIES (MYCOBACTERIA): Acid Fast Culture: NEGATIVE

## 2019-07-13 DEATH — deceased

## 2021-09-28 IMAGING — DX DG ABDOMEN 1V
1 series · 1 of 1 positions shown · non-contrast
Comparison: None.

CLINICAL DATA: Status post PEG tube placement.

EXAM:
ABDOMEN - 1 VIEW

[abdomen kub]
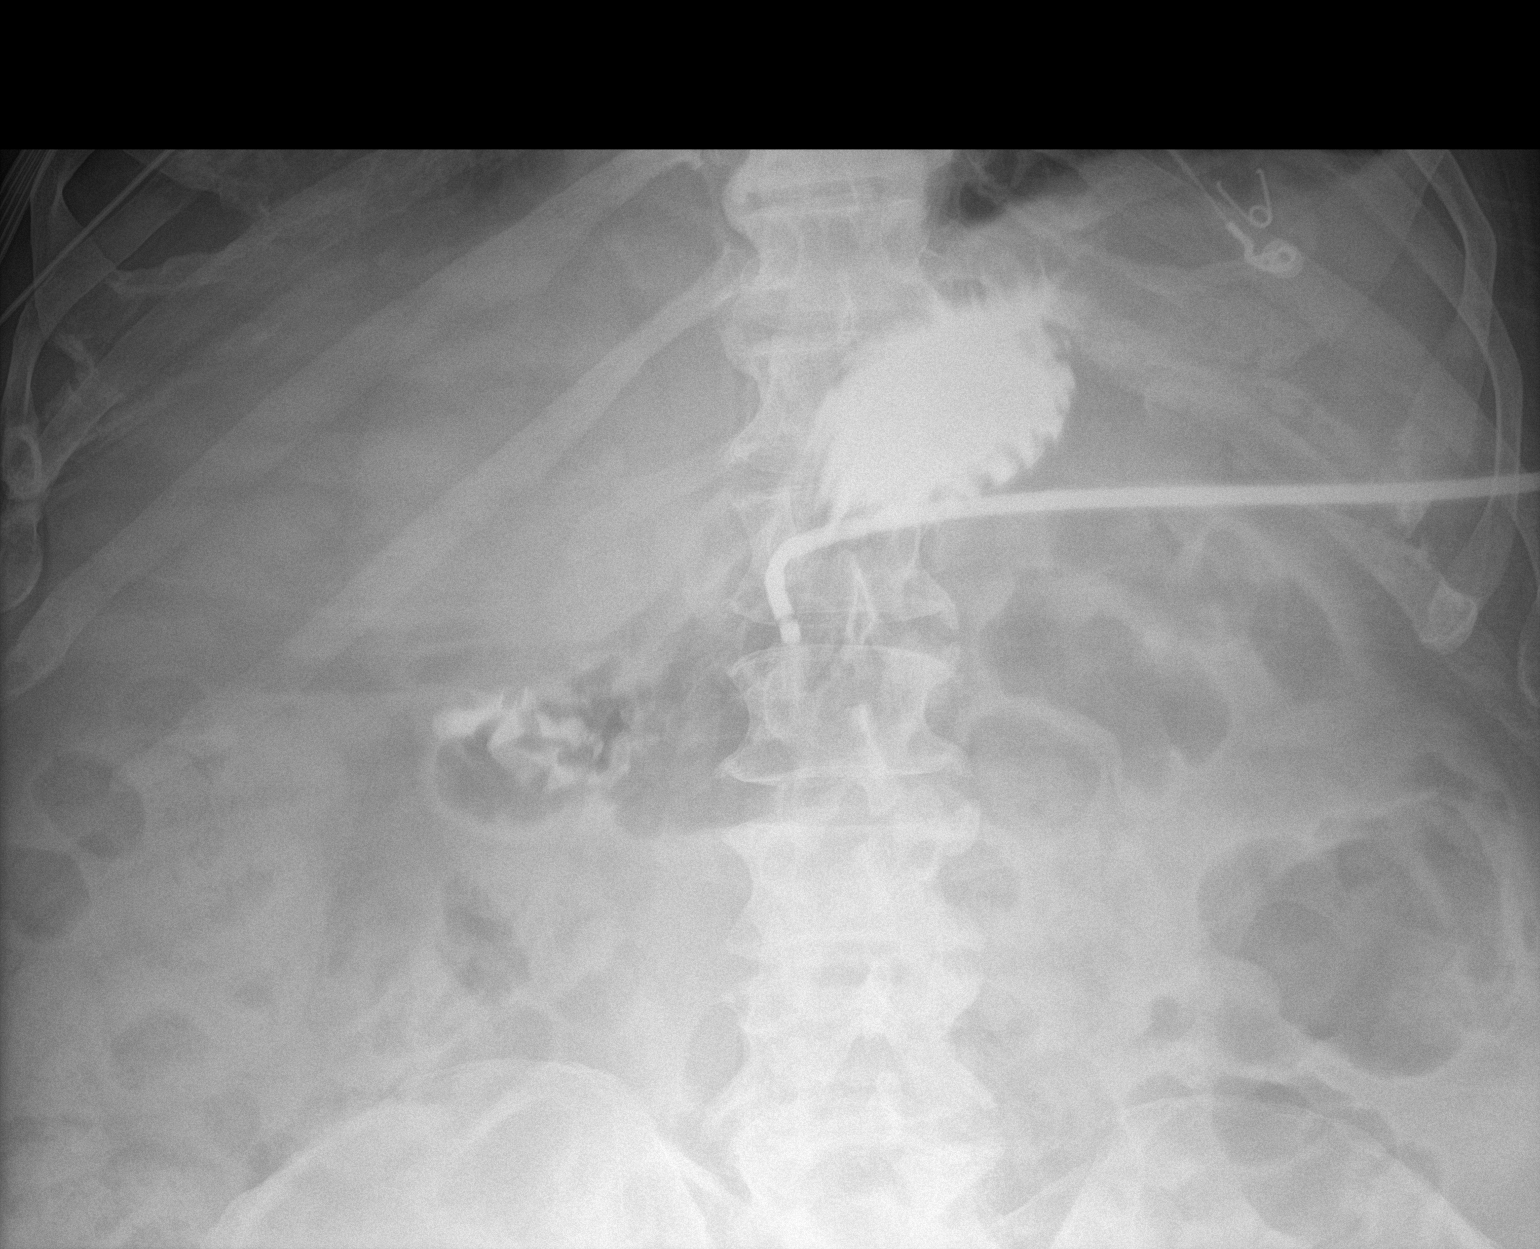

[1 of 1 positions shown; findings below may reference images not displayed]

FINDINGS: Injection of contrast through the gastrostomy tube opacifies the
stomach. The bowel gas pattern is nonobstructive and nonspecific.
IMPRESSION: Injection of contrast through the PEG tube opacifies the stomach.

## 2021-10-01 IMAGING — DX DG CHEST 1V PORT
1 series · 1 of 1 positions shown · non-contrast
Comparison: 12/25/2018

CLINICAL DATA: Respiratory failure

EXAM:
PORTABLE CHEST 1 VIEW

[chest]
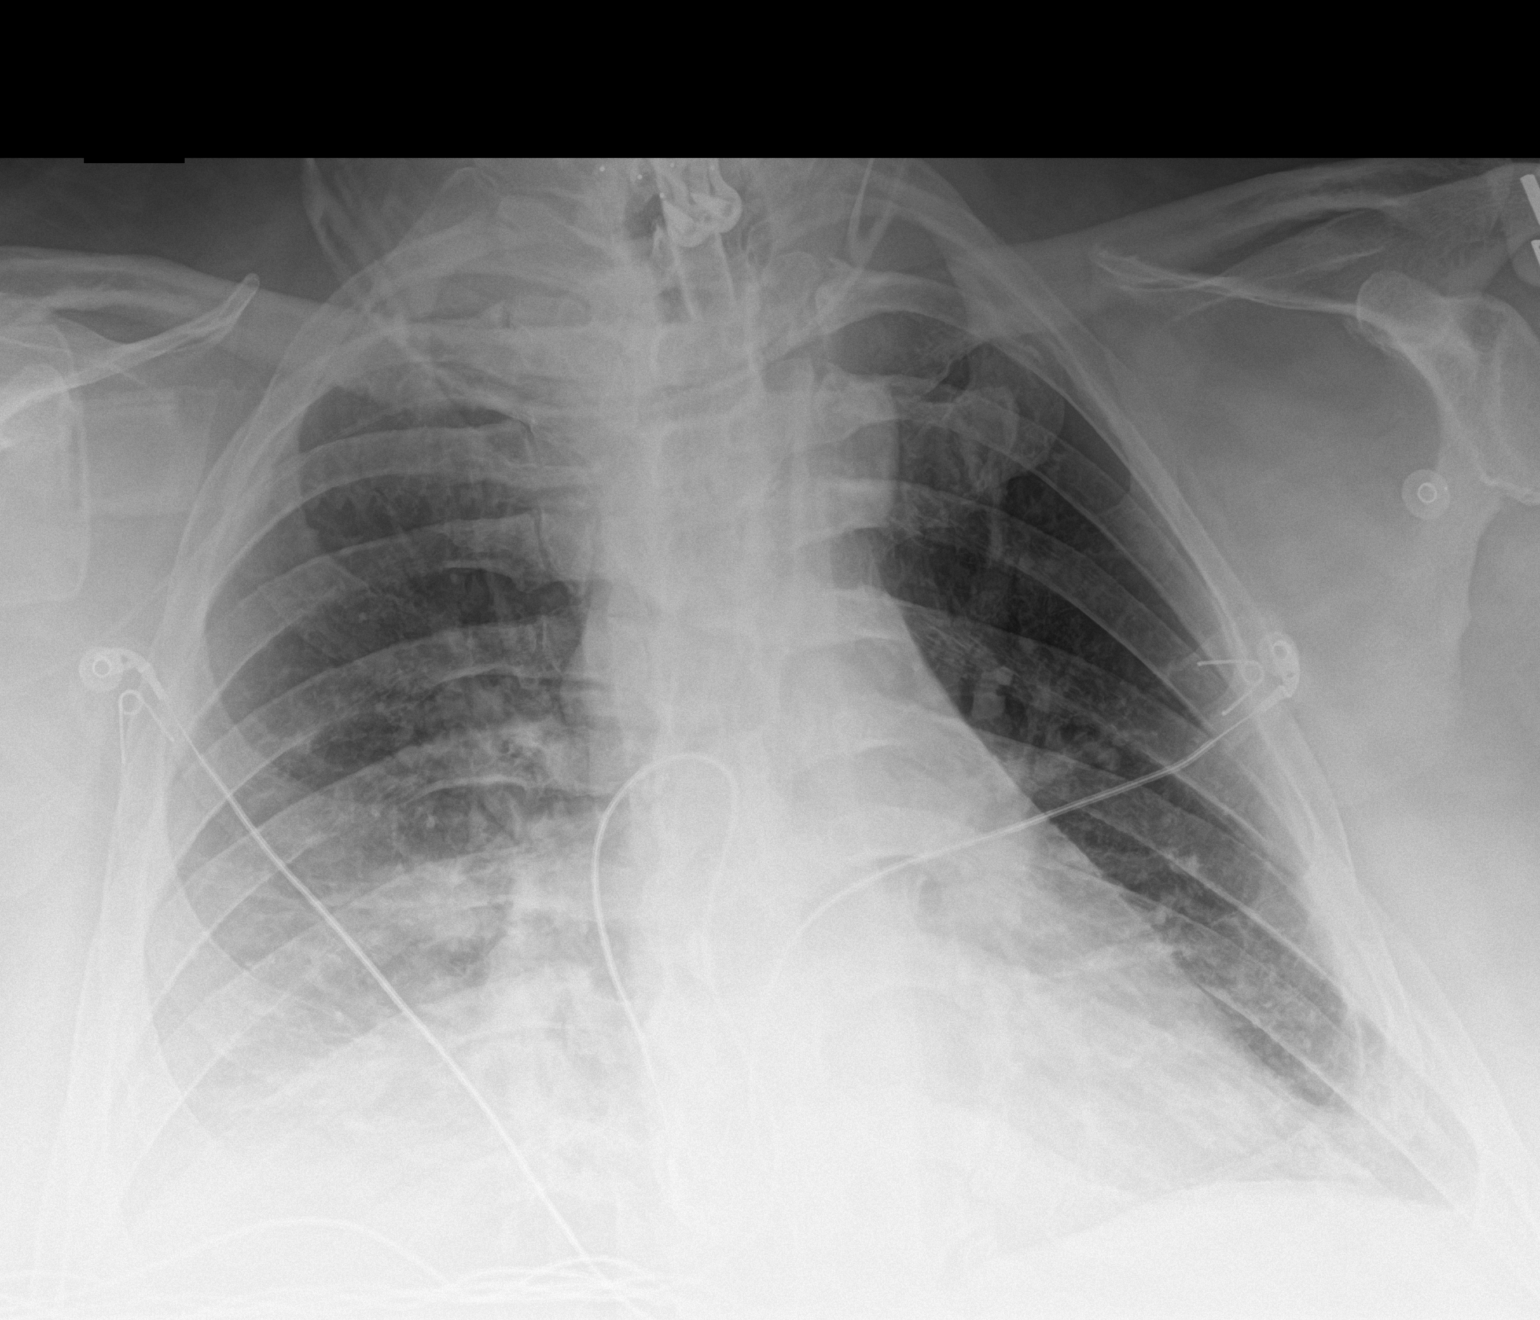

[1 of 1 positions shown; findings below may reference images not displayed]

FINDINGS: There is a small, layering right pleural effusion and associated
atelectasis or consolidation, more clearly appreciated on this
examination. There is chronic pleural thickening about the left lung
base. Tracheostomy. Cardiomegaly.
IMPRESSION: 1. There is a small, layering right pleural effusion and associated
atelectasis or consolidation, more clearly appreciated on this
examination, likely increased. PA and lateral radiographs or CT may
be helpful to further assess.

2.  Tracheostomy.

3.  Cardiomegaly.

## 2021-10-10 IMAGING — DX DG CHEST 1V PORT
1 series · 1 of 1 positions shown · non-contrast
Comparison: January 02, 2019.

CLINICAL DATA: Pneumonia.

EXAM:
PORTABLE CHEST 1 VIEW

[chest]
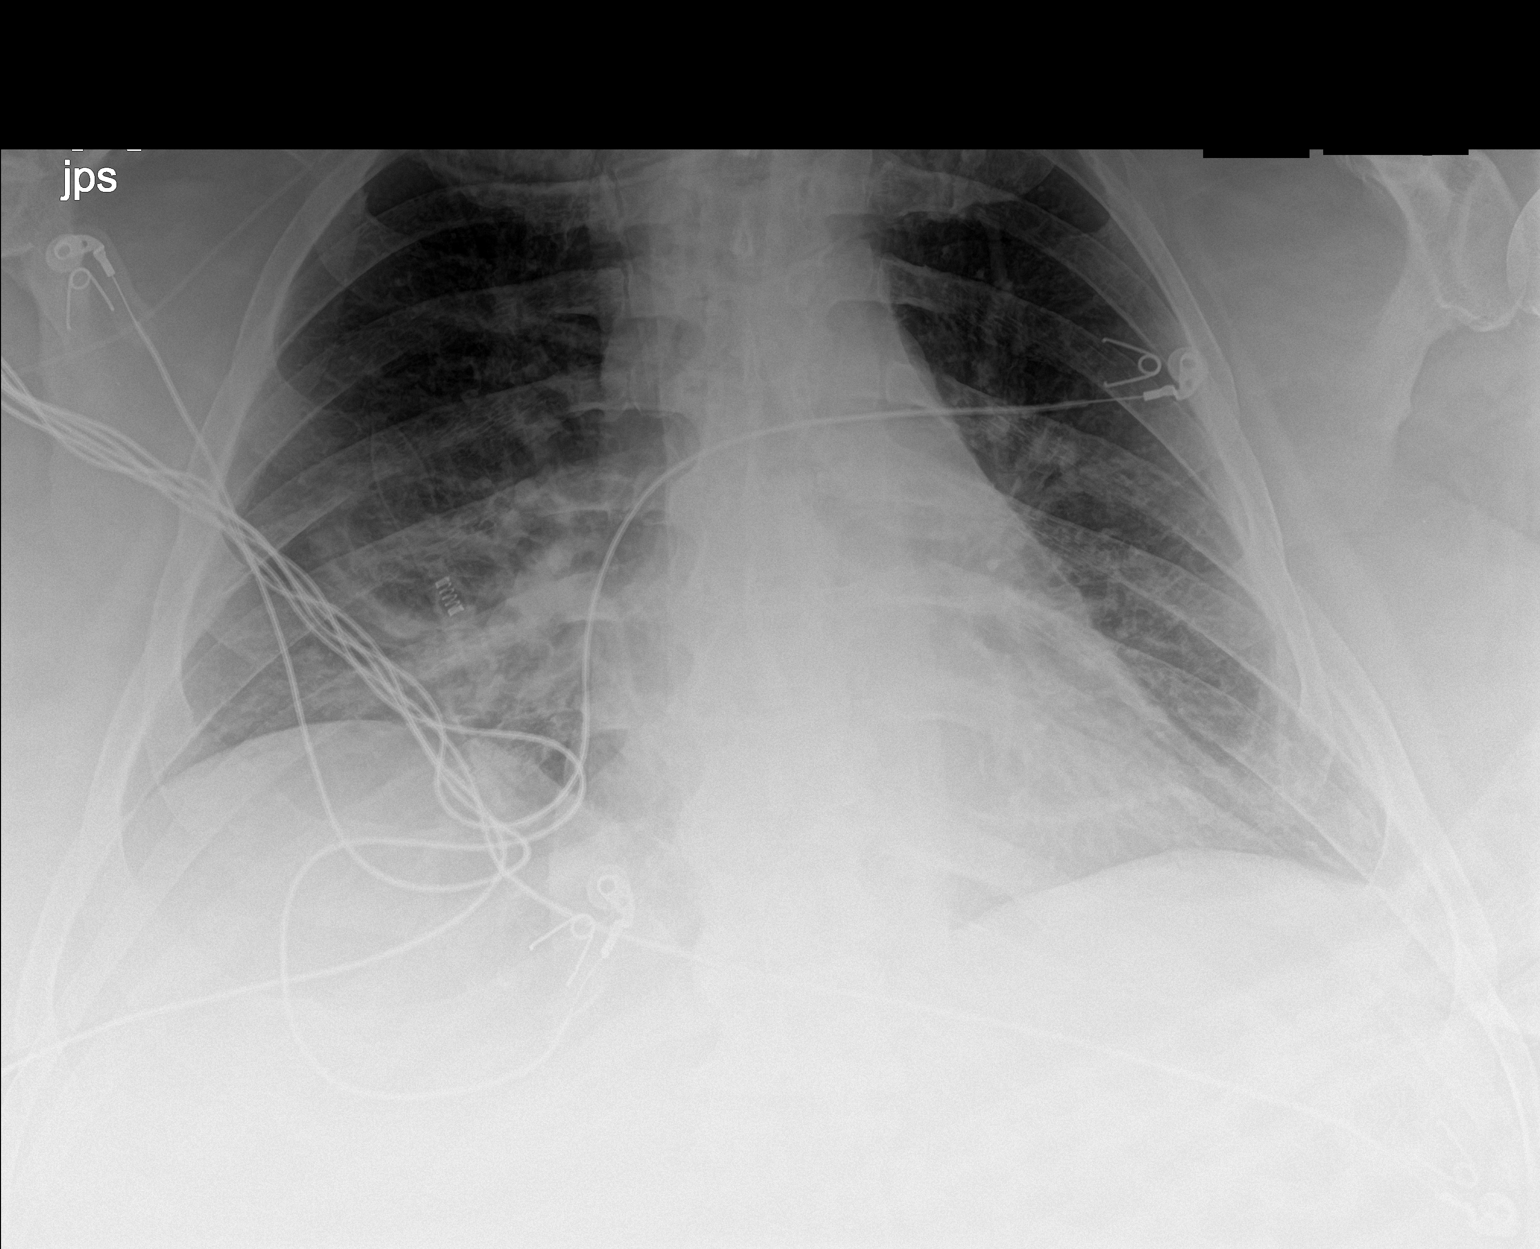

[1 of 1 positions shown; findings below may reference images not displayed]

FINDINGS: Stable cardiomediastinal silhouette. Right-sided PICC line is
unchanged. No pneumothorax is noted. Left lung is clear. Mild right
basilar opacity is noted concerning for atelectasis or infiltrate.
Bony thorax is unremarkable.
IMPRESSION: Mild right basilar atelectasis or infiltrate.

## 2021-10-20 IMAGING — US US RENAL
1 series · 14 of 25 positions shown · non-contrast
Comparison: None.

CLINICAL DATA: 53-year-old male with acute renal insufficiency.

EXAM:
RENAL / URINARY TRACT ULTRASOUND COMPLETE

[Series 1: us renal · 14 of 29 slices shown]
[im 1/29]
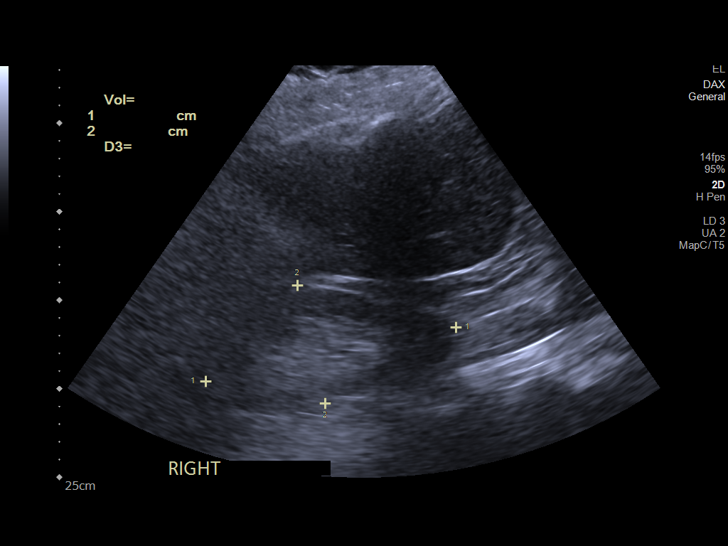
[im 3/29]
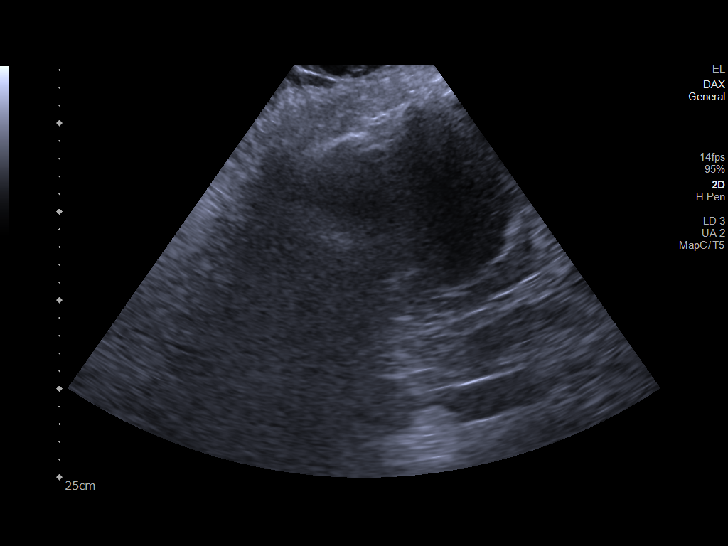
[im 5/29]
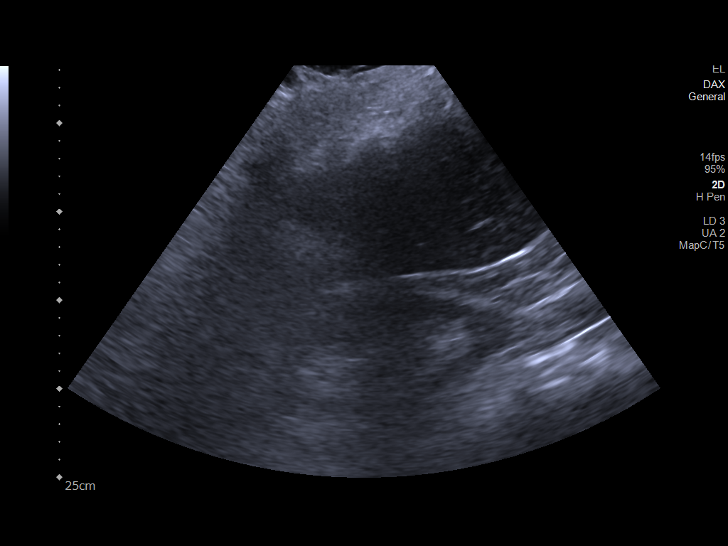
[im 8/29]
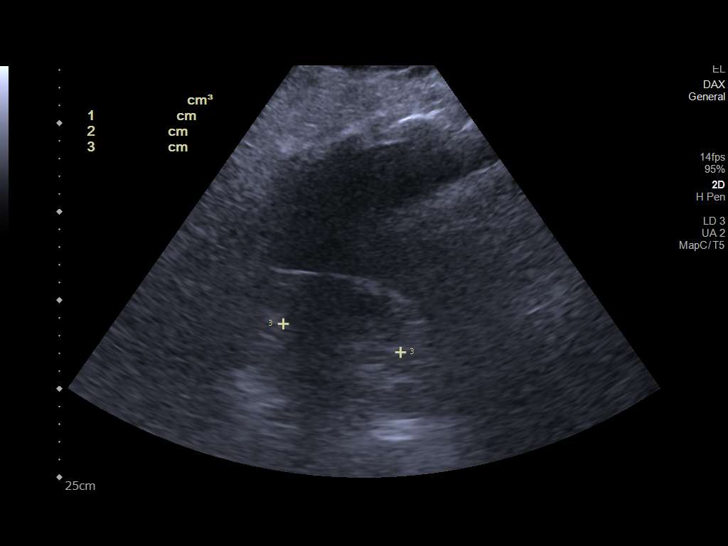
[im 10/29]
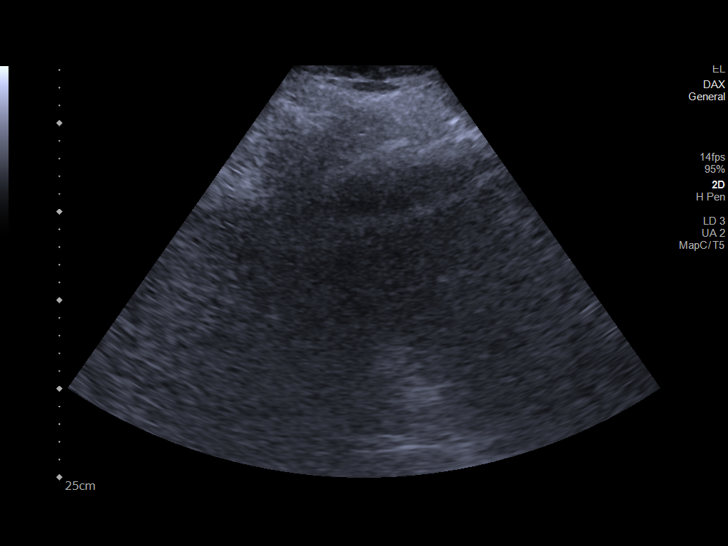
[im 11/29]
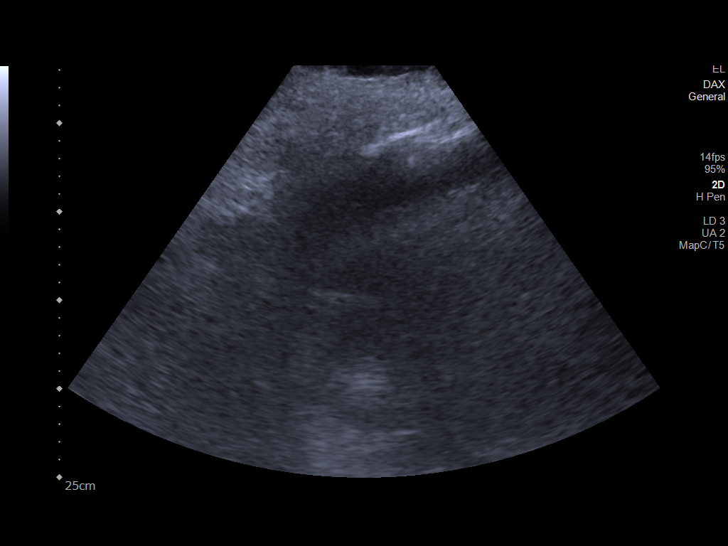
[im 13/29]
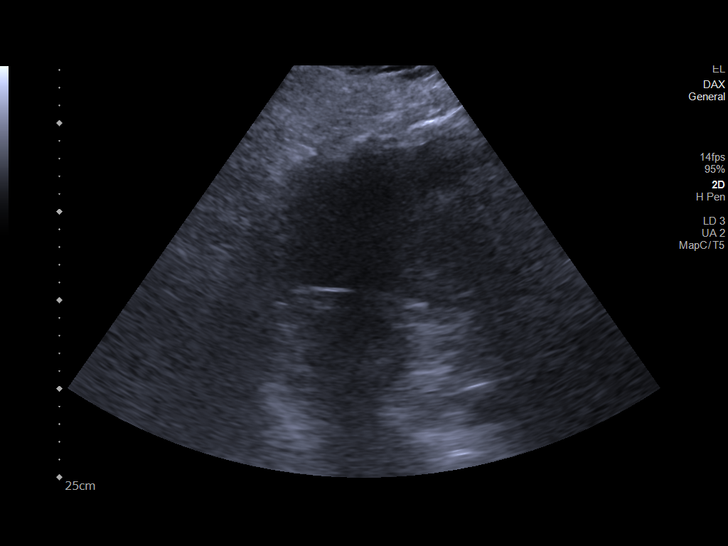
[im 16/29]
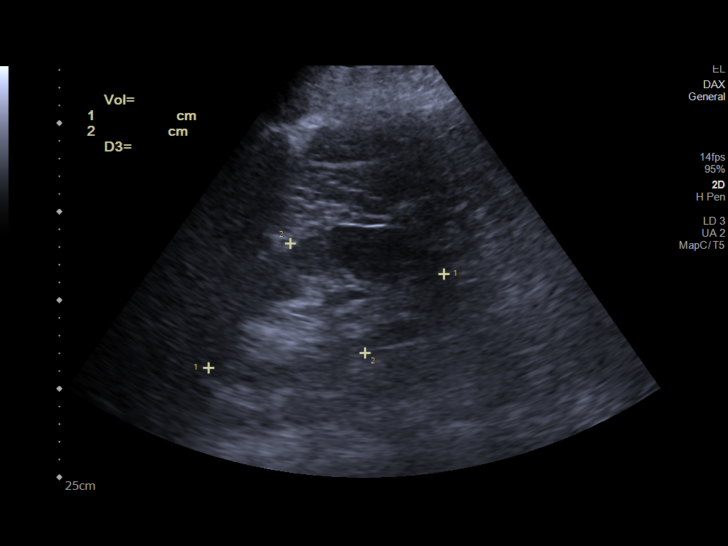
[im 18/29]
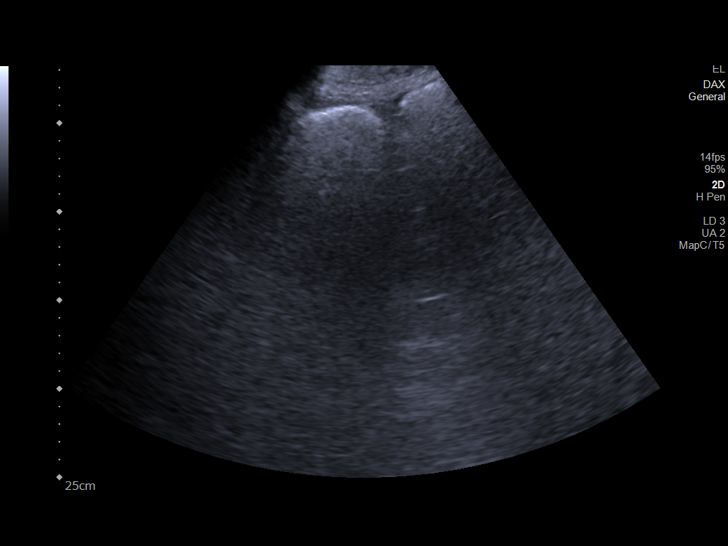
[im 19/29]
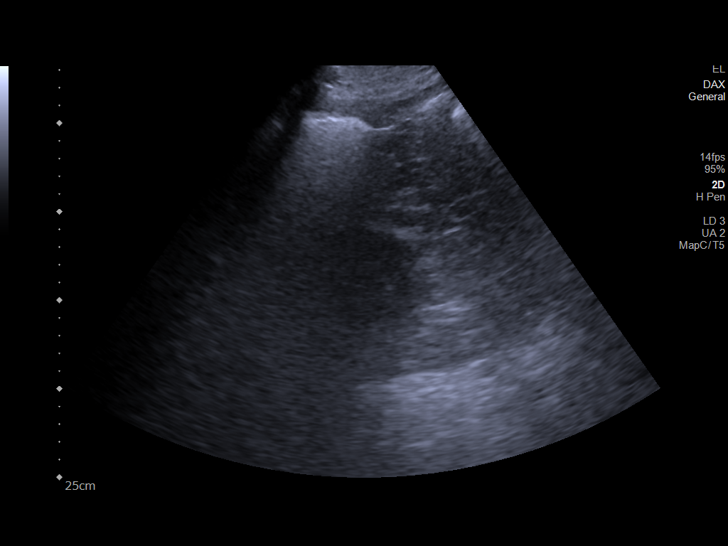
[im 22/29]
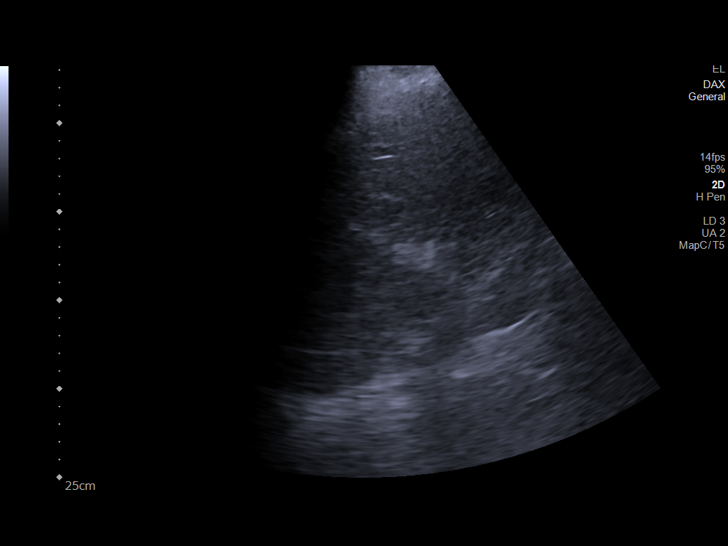
[im 24/29]
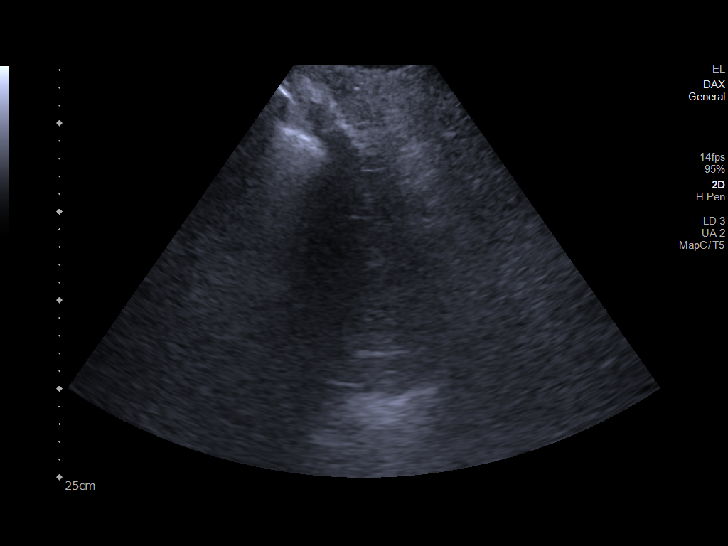
[im 26/29]
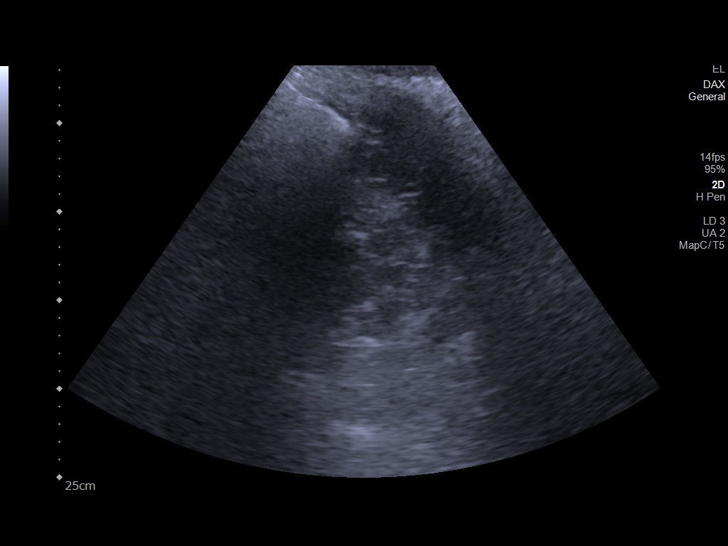
[im 29/29]
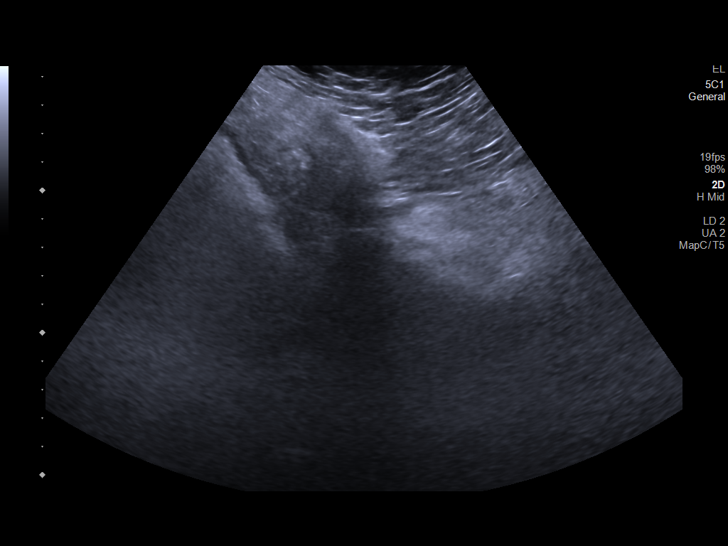

[14 of 25 positions shown; findings below may reference images not displayed]

FINDINGS: Right Kidney:

Renal measurements: 14.5 x 6.9 x 6.9 cm = volume: 354 mL. Normal
echogenicity. No hydronephrosis or shadowing stone.

Left Kidney:

Renal measurements: 14.3 x 7.5 x 6.9 cm = volume: 386 mL. Normal
echogenicity. No hydronephrosis or shadowing stone.

Bladder:

The urinary bladder is decompressed around a Foley catheter.

Other:

None.
IMPRESSION: No hydronephrosis or shadowing stone.

## 2021-10-31 IMAGING — DX DG CHEST 1V PORT
1 series · 1 of 1 positions shown · non-contrast
Comparison: 01/16/2019

CLINICAL DATA: Spinal cord injury.  Cough and pneumonia.

EXAM:
PORTABLE CHEST 1 VIEW

[chest ap]
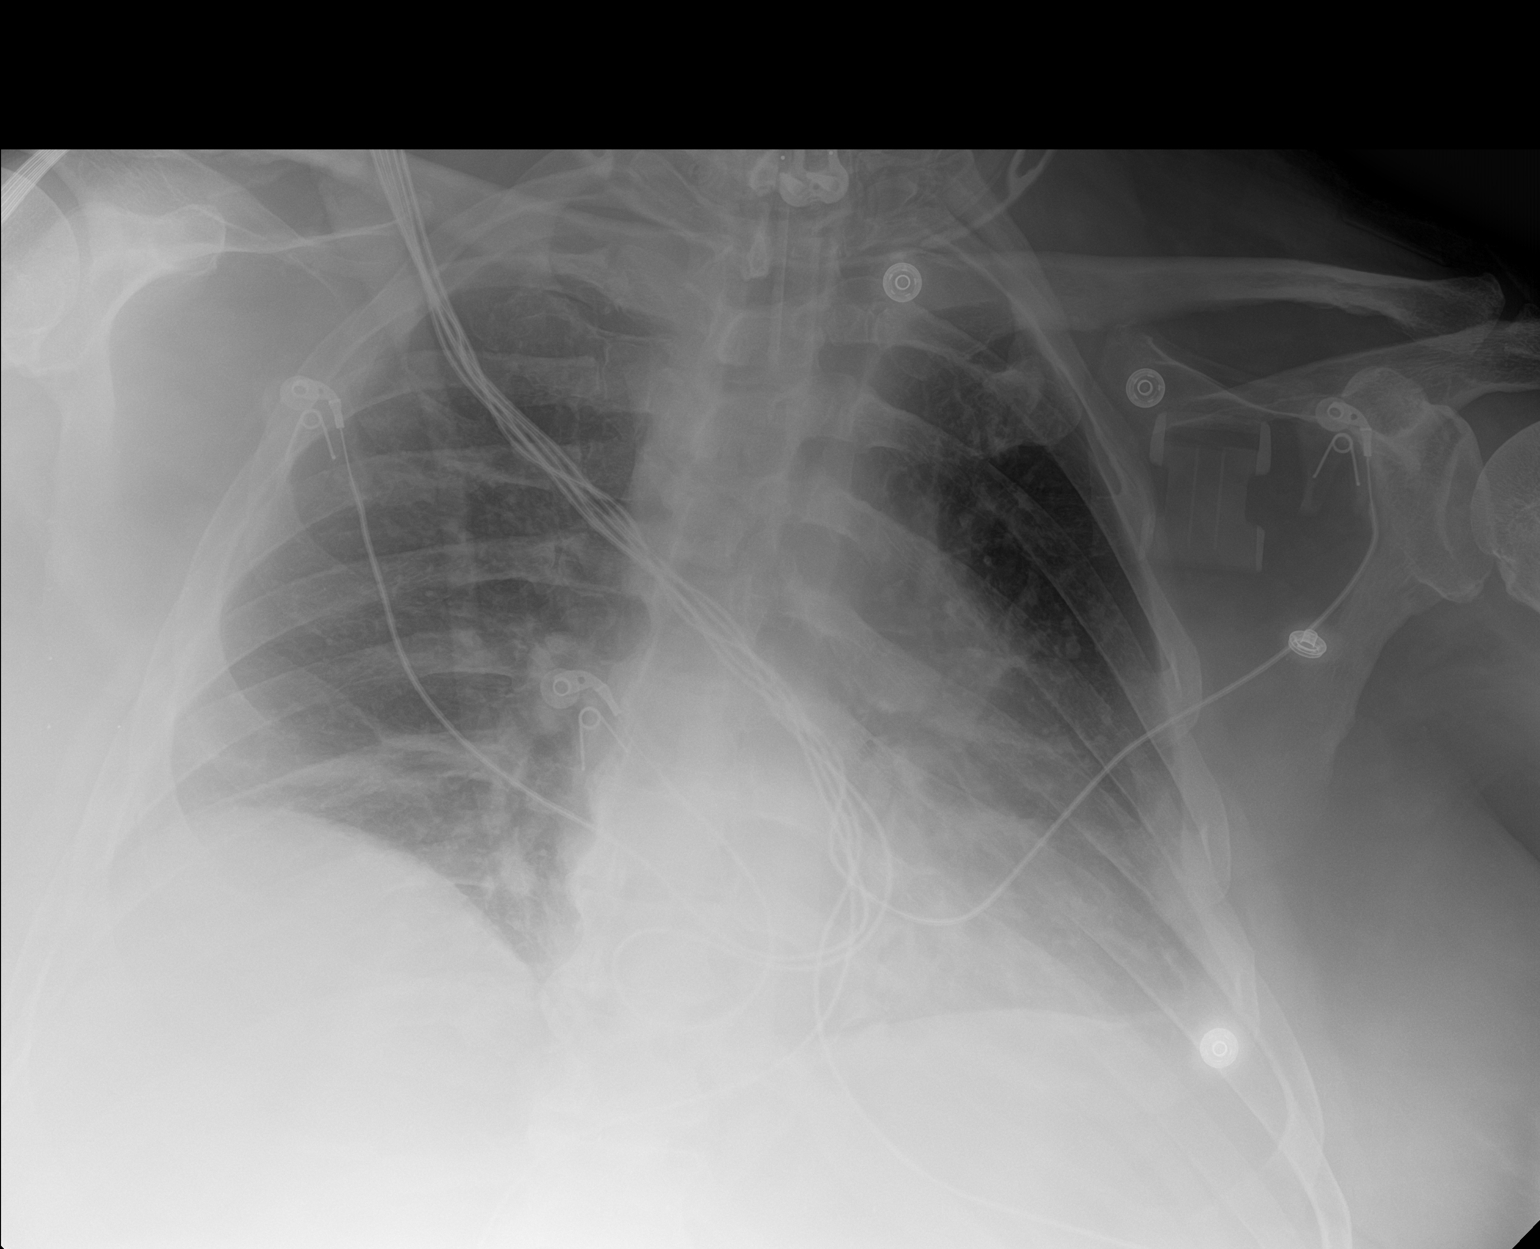

[1 of 1 positions shown; findings below may reference images not displayed]

FINDINGS: Previous tracheostomy. Patchy infiltrates in the lower lobes left
more than right have improved but not completely resolved. Small
amount of pleural fluid on the left. No worsening or other new
finding.
IMPRESSION: Some improvement in patchy bilateral lower lobe pneumonia left more
than right, but not completely resolved yet. Small amount of pleural
fluid on the left.
